# Patient Record
Sex: Female | Born: 1935 | Race: Black or African American | Hispanic: No | Smoking: Never smoker
Health system: Southern US, Community
[De-identification: ages and names within clinical notes are randomized; demographics above are authoritative.]

## PROBLEM LIST (undated history)

## (undated) DIAGNOSIS — M169 Osteoarthritis of hip, unspecified: Secondary | ICD-10-CM

## (undated) DIAGNOSIS — D509 Iron deficiency anemia, unspecified: Secondary | ICD-10-CM

## (undated) DIAGNOSIS — I10 Essential (primary) hypertension: Secondary | ICD-10-CM

## (undated) DIAGNOSIS — K259 Gastric ulcer, unspecified as acute or chronic, without hemorrhage or perforation: Secondary | ICD-10-CM

## (undated) DIAGNOSIS — E785 Hyperlipidemia, unspecified: Secondary | ICD-10-CM

## (undated) DIAGNOSIS — Z9889 Other specified postprocedural states: Secondary | ICD-10-CM

## (undated) DIAGNOSIS — E890 Postprocedural hypothyroidism: Secondary | ICD-10-CM

## (undated) DIAGNOSIS — Z90710 Acquired absence of both cervix and uterus: Secondary | ICD-10-CM

## (undated) DIAGNOSIS — I2510 Atherosclerotic heart disease of native coronary artery without angina pectoris: Secondary | ICD-10-CM

## (undated) HISTORY — DX: Postprocedural hypothyroidism: E89.0

## (undated) HISTORY — DX: Other specified postprocedural states: Z98.890

## (undated) HISTORY — PX: THYROIDECTOMY: SHX17

## (undated) HISTORY — DX: Osteoarthritis of hip, unspecified: M16.9

## (undated) HISTORY — DX: Hyperlipidemia, unspecified: E78.5

## (undated) HISTORY — DX: Gastric ulcer, unspecified as acute or chronic, without hemorrhage or perforation: K25.9

## (undated) HISTORY — DX: Acquired absence of both cervix and uterus: Z90.710

## (undated) HISTORY — PX: ABDOMINAL HYSTERECTOMY: SHX81

## (undated) HISTORY — DX: Iron deficiency anemia, unspecified: D50.9

## (undated) HISTORY — DX: Essential (primary) hypertension: I10

## (undated) HISTORY — PX: EYE SURGERY: SHX253

## (undated) HISTORY — DX: Atherosclerotic heart disease of native coronary artery without angina pectoris: I25.10

## (undated) NOTE — *Deleted (*Deleted)
Per  Patient request called her niece Shawna Orleans, states that she will be up to visit this evening.

---

## 2005-05-23 ENCOUNTER — Encounter: Admission: RE | Admit: 2005-05-23 | Discharge: 2005-05-23 | Payer: Self-pay | Admitting: Internal Medicine

## 2006-05-25 ENCOUNTER — Encounter: Admission: RE | Admit: 2006-05-25 | Discharge: 2006-05-25 | Payer: Self-pay | Admitting: Internal Medicine

## 2006-11-26 ENCOUNTER — Encounter: Payer: Self-pay | Admitting: Cardiology

## 2006-11-26 ENCOUNTER — Inpatient Hospital Stay (HOSPITAL_COMMUNITY): Admission: EM | Admit: 2006-11-26 | Discharge: 2006-12-03 | Payer: Self-pay | Admitting: Internal Medicine

## 2006-11-27 ENCOUNTER — Encounter (INDEPENDENT_AMBULATORY_CARE_PROVIDER_SITE_OTHER): Payer: Self-pay | Admitting: Cardiology

## 2006-11-27 ENCOUNTER — Encounter: Payer: Self-pay | Admitting: Cardiology

## 2006-11-29 ENCOUNTER — Encounter: Payer: Self-pay | Admitting: Cardiology

## 2006-11-29 IMAGING — CR DG HIP COMPLETE 2+V*R*
3 series · 3 of 3 positions shown · non-contrast
Comparison: none

CLINICAL DATA: Right groin/hip pain.  No history of trauma provided.
 RIGHT HIP INCLUDING AP PELVIS ? 2 VIEW:

[t pelvis a.p.]
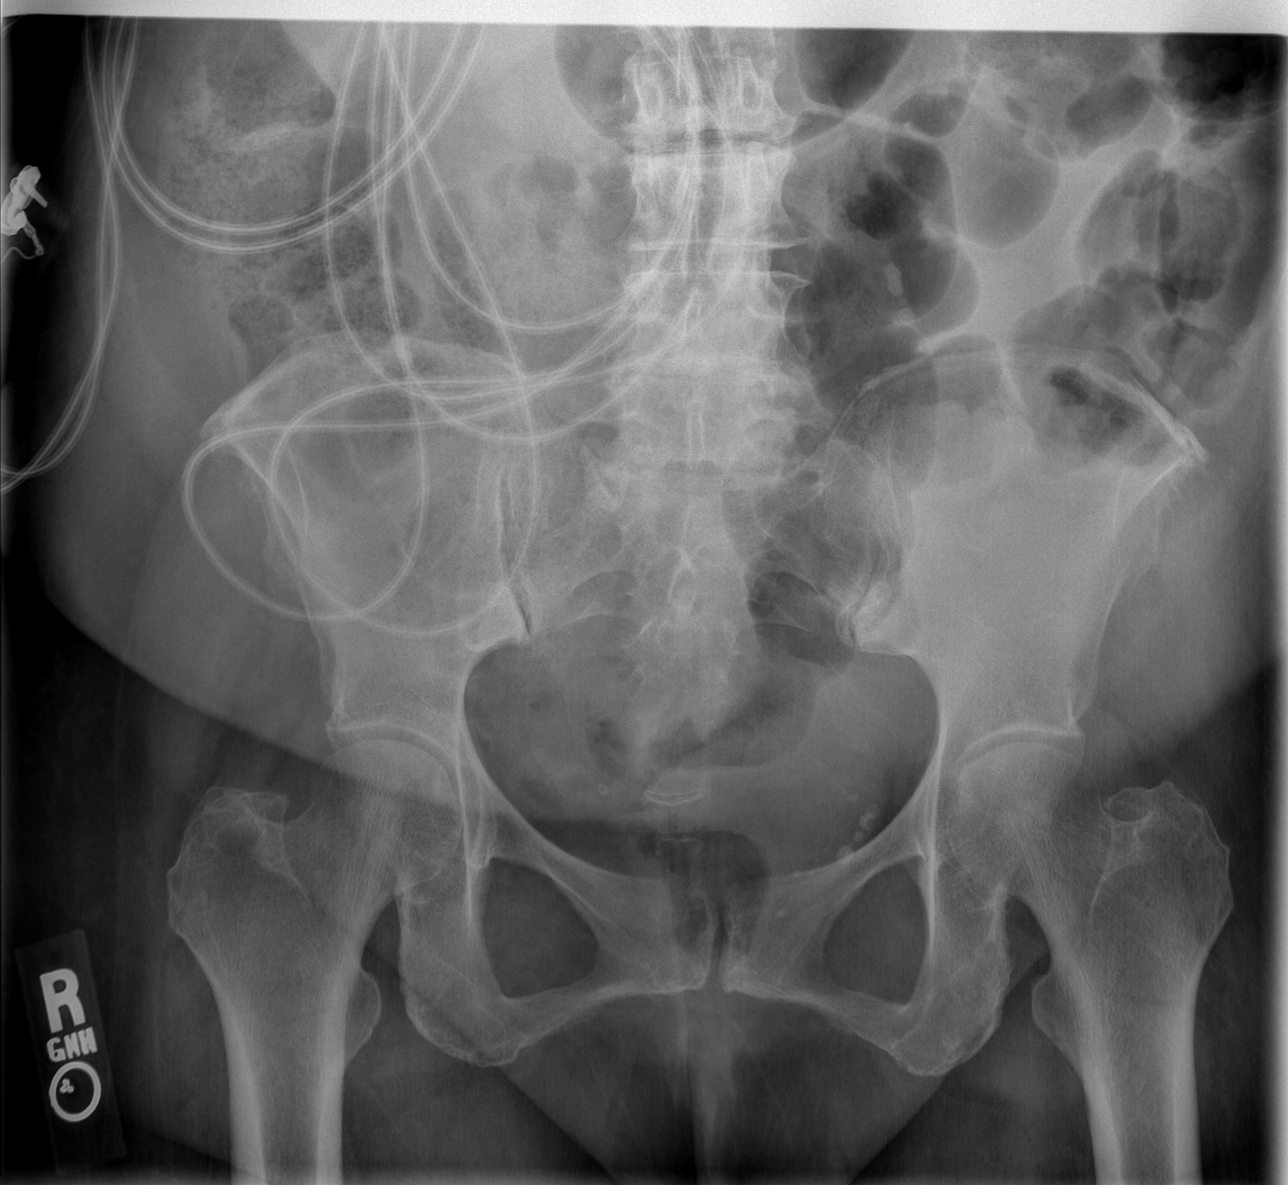

[t hip ap right]
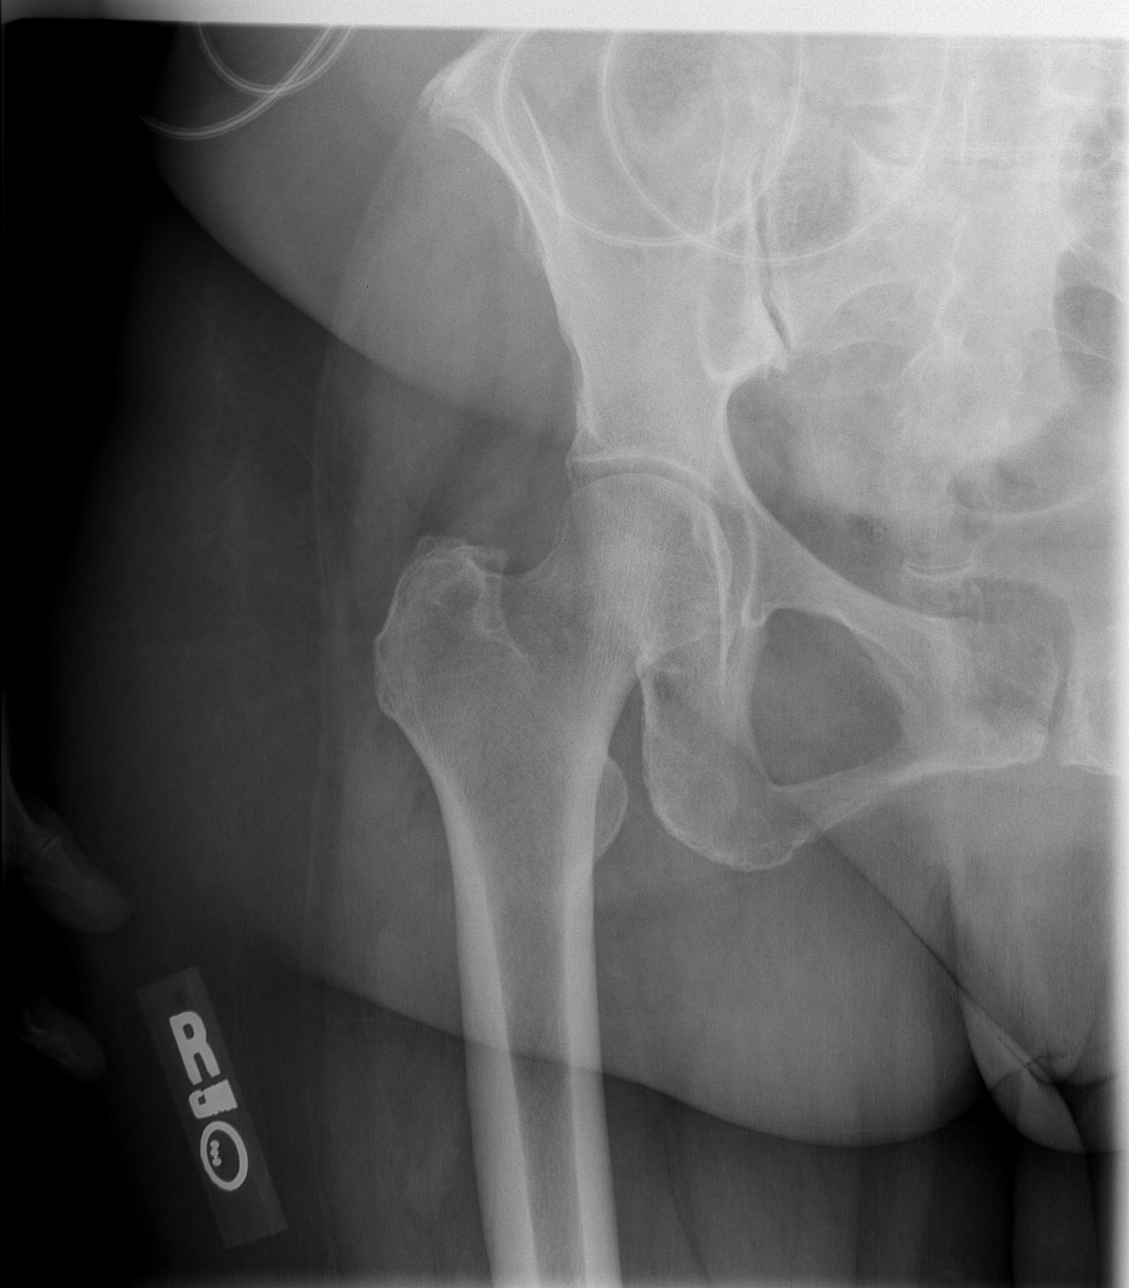

[t hip frog leg right]
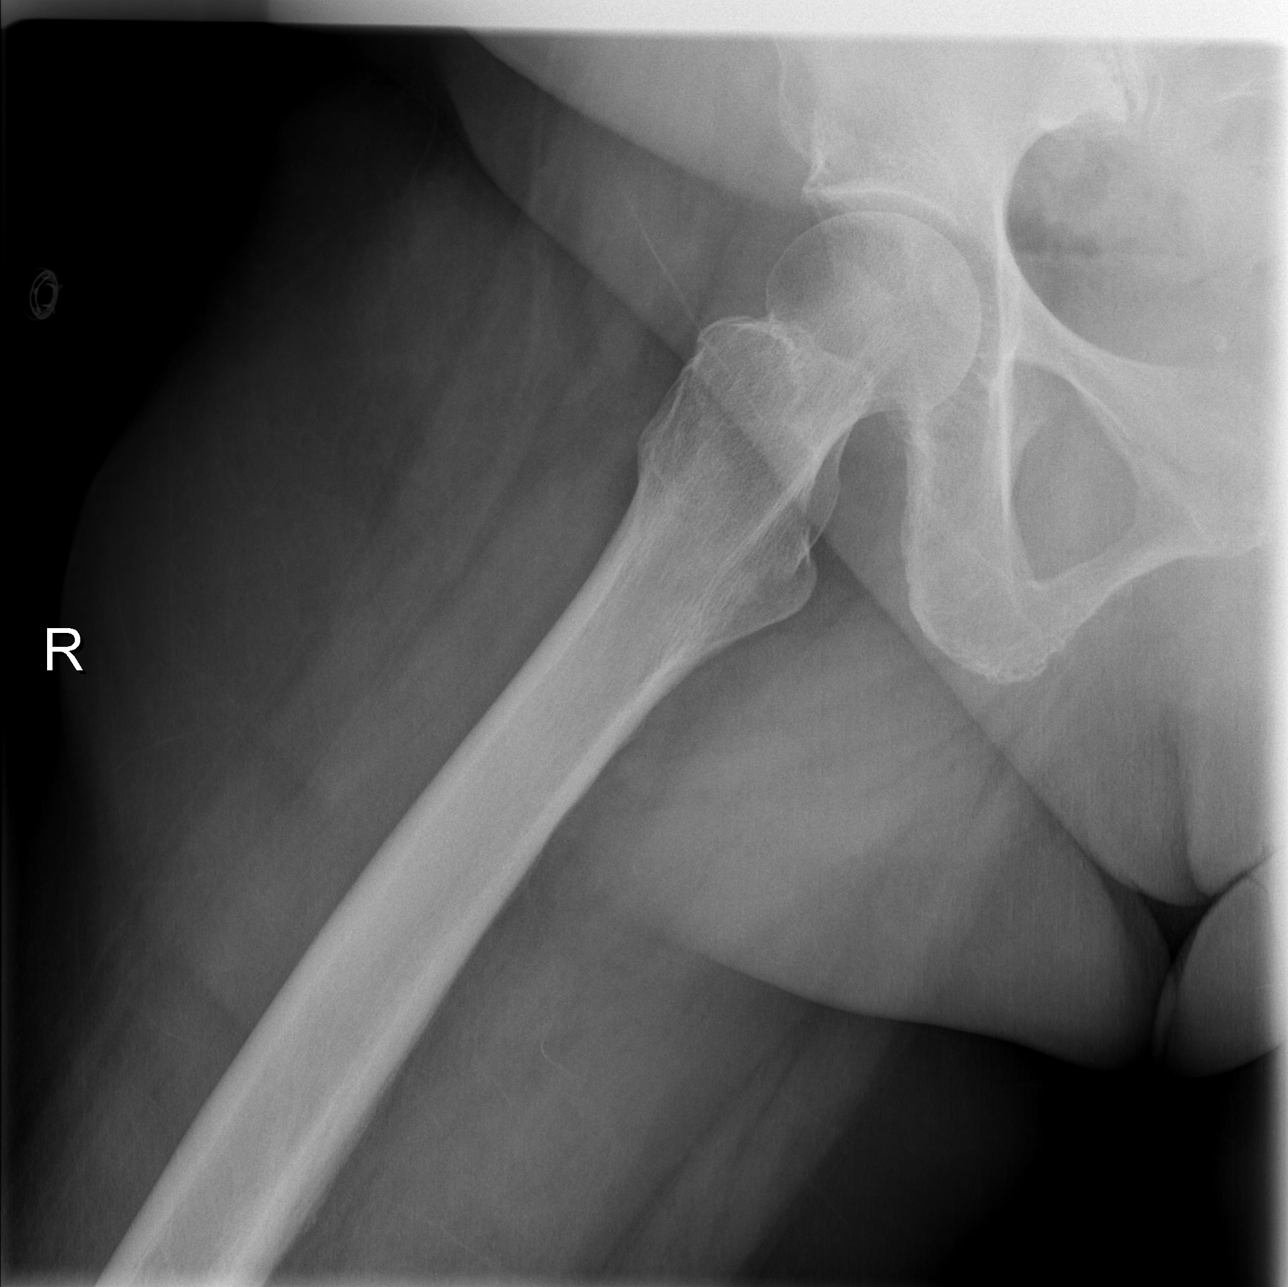

[3 of 3 positions shown; findings below may reference images not displayed]

FINDINGS: No significant right hip joint degenerative changes or plain film evidence of avascular necrosis.  If this were of concern, MR Nasifa be considered.  No bony destructive lesion.  Mild bilateral sacroiliac joint degenerative changes.  L4-5 and L5-S1 disk space narrowing.
IMPRESSION: 1.  Mild bilateral sacroiliac degenerative changes and degenerative change lower lumbar spine.  
 2.  No significant degenerative changes of either hip or plain film evidence of avascular necrosis.

## 2006-11-30 ENCOUNTER — Encounter: Payer: Self-pay | Admitting: Cardiology

## 2007-04-20 ENCOUNTER — Encounter: Payer: Self-pay | Admitting: Cardiology

## 2008-05-12 ENCOUNTER — Encounter: Admission: RE | Admit: 2008-05-12 | Discharge: 2008-05-12 | Payer: Self-pay | Admitting: Internal Medicine

## 2008-12-04 ENCOUNTER — Encounter: Payer: Self-pay | Admitting: Cardiology

## 2009-01-05 ENCOUNTER — Encounter: Payer: Self-pay | Admitting: Cardiology

## 2009-05-24 ENCOUNTER — Encounter: Admission: RE | Admit: 2009-05-24 | Discharge: 2009-05-24 | Payer: Self-pay | Admitting: Internal Medicine

## 2010-01-07 ENCOUNTER — Encounter: Payer: Self-pay | Admitting: Cardiology

## 2010-01-10 ENCOUNTER — Telehealth (INDEPENDENT_AMBULATORY_CARE_PROVIDER_SITE_OTHER): Payer: Self-pay | Admitting: *Deleted

## 2010-01-10 ENCOUNTER — Ambulatory Visit: Payer: Self-pay | Admitting: Cardiology

## 2010-01-10 DIAGNOSIS — I2510 Atherosclerotic heart disease of native coronary artery without angina pectoris: Secondary | ICD-10-CM | POA: Insufficient documentation

## 2010-01-10 DIAGNOSIS — I10 Essential (primary) hypertension: Secondary | ICD-10-CM

## 2010-01-31 ENCOUNTER — Ambulatory Visit: Payer: Self-pay | Admitting: Internal Medicine

## 2010-01-31 ENCOUNTER — Ambulatory Visit (HOSPITAL_COMMUNITY): Admission: RE | Admit: 2010-01-31 | Discharge: 2010-01-31 | Payer: Self-pay | Admitting: Cardiology

## 2010-01-31 ENCOUNTER — Ambulatory Visit: Payer: Self-pay

## 2010-01-31 ENCOUNTER — Encounter: Payer: Self-pay | Admitting: Cardiology

## 2010-07-18 ENCOUNTER — Ambulatory Visit: Payer: Self-pay | Admitting: Cardiology

## 2010-10-08 NOTE — Assessment & Plan Note (Signed)
Summary: f21m   Primary Bonnie Morton:  Bonnie Devon, MD  CC:  follow up 6 months..  History of Present Illness: 77 yo with history of CAD s/p NSTEMI in 2008 with 2 Cypher stents to the proximal to mid LAD presents for followup followup.  Bonnie Morton's symptoms in 2008 were chest burning with left arm radiation while walking.  She has been doing well since then with no further ACS episodes.  She gets rare mild chest burning after eating (not with exertion) that seems most likely to be reflux.  She does not get chest pain or shortness of breath with walking.  She is not particularly active but can walk all around [employer] and up and down steps without trouble.  At last appointment, she reported some episodes of lightheadedness with prolonged standing at church.  She has tried to avoid prolonged standing since then and has had no significant lightheadedness.  No syncope or presyncope.  She has been working on weight loss and weight is down 10 lbs.  Echo in 5/11 showed EF 60-65%, grade I diastolic dysfunction, normal wall motion.   ECG: Sinus bradycardia at 51, LAFB, incomplete RBBB.    Labs (5/11): TSH normal, LDL 53, HDL 57, K 3.6, creatinine 2.84  Current Medications (verified): 1)  Plavix 75 Mg Tabs (Clopidogrel Bisulfate) .... Take One Tablet Once Daily 2)  Aspirin 81 Mg Tabs (Aspirin) .... Once Daily 3)  Crestor 5 Mg Tabs (Rosuvastatin Calcium) .... Take One Tablet Once Daily 4)  Amlodipine Besylate 10 Mg Tabs (Amlodipine Besylate) .... Take One Tablet Once Daily 5)  Benicar Hct 40-25 Mg Tabs (Olmesartan Medoxomil-Hctz) .... Take One Tablet Once Daily 6)  Metoprolol Tartrate 25 Mg Tabs (Metoprolol Tartrate) .... Take 1/2 Tablet Two Times A Day 7)  Timolol Maleate 0.5 % Soln (Timolol Maleate) .... One Gtt Both Eyes  Two Times A Day 8)  Calcium-Vitamin D 600-200 Mg-Unit Tabs (Calcium-Vitamin D) .... Take One Tablet Two Times A Day 9)  Fish Oil 1000 Mg Caps (Omega-3 Fatty Acids) .... Take One  Capsule Daily 10)  Stool Softener 100 Mg Caps (Docusate Sodium) .... Daily  Allergies (verified): No Known Drug Allergies  Past History:  Past Medical History: 1. CAD:  Patient developed chest burning with radiation to her left arm with exertion in 2008.  She went to ER, found to have NSTEMI, LHC showed long 75-80% proximal to mid LAD stenosis with 99% ostial D2 stenosis and diffuse mild to moderate RCA disease.  She had 2 Cypher stents to the proximal to mid LAD.  Echo (5/11): EF 60-65%, grade I diastolic dysfunction, normal wall motion, no significant valvular abnormalities.  2.  Subtotal thyroidectomy 3.  HTN 4.  Hyperlipidemia 5.  Fe-deficiency anemia with normal colonoscopy last year per her report.  6.  Hip osteoarthritis 7.  Hysterectomy  Family History: Reviewed history from 01/10/2010 and no changes required. Noncontributory  Social History: Reviewed history from 01/10/2010 and no changes required. 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  She lives with her sister.  She does not smoke or drink ETOH.   Vital Signs:  Patient profile:   77 year old female Height:      63 inches Weight:      148 pounds Pulse rate:   51 / minute Pulse rhythm:   regular BP sitting:   120 / 68  (left arm) Cuff size:   regular  Vitals Entered By: Marchelle Folks  Trulove CMA (July 18, 2010 10:47 AM)  Physical Exam  General:  Well developed, well nourished, in no acute distress. Neck:  Neck supple, no JVD. No masses, thyromegaly or abnormal cervical nodes. Lungs:  Clear bilaterally to auscultation and percussion. Heart:  Non-displaced PMI, chest non-tender; regular rate and rhythm, S1, S2 without rubs or gallops. 1/6 early systolic murmur.  Carotid upstroke normal, no bruit. Weak posterior tibial pulses. No edema, no varicosities. Abdomen:  Bowel sounds positive; abdomen soft and non-tender without masses, organomegaly, or hernias noted. No  hepatosplenomegaly. Extremities:  No clubbing or cyanosis. Neurologic:  Alert and oriented x 3. Psych:  Normal affect.   Impression & Recommendations:  Problem # 1:  CORONARY ATHEROSCLEROSIS NATIVE CORONARY ARTERY (ICD-414.01) History of NSTEMI with LAD PCI in 2008.  No ischemic symptoms.  Doing well in general.  Will continue ASA, Plavix, statin, ARB, and beta blocker.  EF preserved on echo.  Lipids at goal in 5/11 (LDL < 70).   Problem # 2:  ESSENTIAL HYPERTENSION (ICD-401.9) BP at goal on current regimen.   Other Orders: EKG w/ Interpretation (93000)

## 2010-10-08 NOTE — Assessment & Plan Note (Signed)
Summary: np6/self refer/jss   Primary Provider:  Andi Devon, MD  CC:  new patient.  Needed to establish with cardiologist since Dr. Elsie Lincoln retired.  History of Present Illness: 77 yo with history of CAD s/p NSTEMI in 2008 with 2 Cypher stents to the proximal to mid LAD presents to establish cardiology followup.  Ms Bonnie Morton's symptoms in 2008 were chest burning with left arm radiation while walking.  She has been doing well since then with no further ACS episodes.  She gets rare mild chest burning after eating (not with exertion) that seems most likely to be reflux.  She does not get chest pain or shortness of breath with walking.  She is not particularly active but can walk all around [employer] and up and down steps without trouble.  She does note that for the last 3 Sundays in a row she has felt lightheaded and clammy after church.  She says that after taking a tablespoon of salt dissolved in water, she will feel better.  Her church service lasts about 2 hours and she is standing for a large portion of it. Besides these episodes, she does not get lightheaded and has no history of syncope.   ECG: Sinus bradycardia at 49, left axis deviation, incomplete RBBB.    Current Medications (verified): 1)  Plavix 75 Mg Tabs (Clopidogrel Bisulfate) .... Take One Tablet Once Daily 2)  Aspirin 81 Mg Tabs (Aspirin) .... Once Daily 3)  Crestor 5 Mg Tabs (Rosuvastatin Calcium) .... Take One Tablet Once Daily 4)  Amlodipine Besylate 10 Mg Tabs (Amlodipine Besylate) .... Take One Tablet Once Daily 5)  Benicar Hct 40-25 Mg Tabs (Olmesartan Medoxomil-Hctz) .... Take One Tablet Once Daily 6)  Metoprolol Tartrate 25 Mg Tabs (Metoprolol Tartrate) .... Take 1/2 Tablet Two Times A Day 7)  Timolol Maleate 0.5 % Soln (Timolol Maleate) .... One Gtt Both Eyes  Two Times A Day 8)  Lotemax 0.5 % Susp (Loteprednol Etabonate) .... One Drop in Right Eye Four Times Daily 9)  Calcium-Vitamin D 600-200 Mg-Unit Tabs  (Calcium-Vitamin D) .... Take One Tablet Two Times A Day 10)  Fish Oil 1000 Mg Caps (Omega-3 Fatty Acids) .... Take One Capsule Daily 11)  Stool Softener 100 Mg Caps (Docusate Sodium) .... Daily  Allergies (verified): No Known Drug Allergies  Past History:  Past Medical History: 1. CAD:  Patient developed chest burning with radiation to her left arm with exertion in 2008.  She went to ER, found to have NSTEMI, LHC showed long 75-80% proximal to mid LAD stenosis with 99% ostial D2 stenosis and diffuse mild to moderate RCA disease.  She had 2 Cypher stents to the proximal to mid LAD.  Echo (3/08) with EF 50%, basal posterior hypokinesis, mild LV hypertrophy.   2.  Subtotal thyroidectomy 3.  HTN 4.  Hyperlipidemia 5.  Fe-deficiency anemia with normal colonoscopy last year per her report.  6.  Hip osteoarthritis 7.  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  She lives with her sister.  She does not smoke or drink ETOH.   Review of Systems       All systems reviewed and negative except as per HPI.   Vital Signs:  Patient profile:   77 year old female Height:      63 inches Weight:      158 pounds BMI:     28 .09 Pulse rate:  49 / minute Pulse rhythm:   regular BP sitting:   124 / 60  (left arm) Cuff size:   regular  Vitals Entered By: Judithe Modest CMA (Jan 10, 2010 8:49 AM)  Physical Exam  General:  Well developed, well nourished, in no acute distress. Head:  normocephalic and atraumatic Nose:  no deformity, discharge, inflammation, or lesions Mouth:  Teeth, gums and palate normal. Oral mucosa normal. Neck:  Neck supple, no JVD. No masses, thyromegaly or abnormal cervical nodes. Lungs:  Clear bilaterally to auscultation and percussion. Heart:  Non-displaced PMI, chest non-tender; regular rate and rhythm, S1, S2 without rubs or gallops. 2/6 early systolic  murmur.  Carotid upstroke normal, no bruit. Weak posterior tibial pulses. No edema, no varicosities. Abdomen:  Bowel sounds positive; abdomen soft and non-tender without masses, organomegaly, or hernias noted. No hepatosplenomegaly. Msk:  Back normal, normal gait. Muscle strength and tone normal. Extremities:  No clubbing or cyanosis. Neurologic:  Alert and oriented x 3. Skin:  Intact without lesions or rashes. Psych:  Normal affect.   Impression & Recommendations:  Problem # 1:  CORONARY ATHEROSCLEROSIS NATIVE CORONARY ARTERY (ICD-414.01) History of NSTEMI with LAD PCI in 2008.  No ischemic symptoms.  Doing well in general.  Will continue ASA, Plavix, statin, ARB, and beta blocker.  She had mildy decreased LV systolic function on echo in 2008.  Will repeat echo to reassess LV systolic function.  We will call Dr. Mathews Robinsons office for her most recent cholesterol results.  Goal LDL < 70 with known CAD.   Problem # 2:  ESSENTIAL HYPERTENSION (ICD-401.9) BP at goal on current regimen.   Problem # 3:  LIGHTHEADEDNESS Patient had lightheadedness after 2 hour church services 3 Sundays in a row.  She spent a lot of time standing.  It is possible that she was somewhat dehydrated as she felt better after drinking water with salt.  I suggested that she stand as little as possible in church and keep herself hydrated.   Other Orders: Echocardiogram (Echo)  Patient Instructions: 1)  Your physician has requested that you have an echocardiogram.  Echocardiography is a painless test that uses sound waves to create images of your heart. It provides your doctor with information about the size and shape of your heart and how well your heart's chambers and valves are working.  This procedure takes approximately one hour. There are no restrictions for this procedure. 2)  Your physician wants you to follow-up in: 6 months with Dr Shirlee Latch.   You will receive a reminder letter in the mail two months in advance. If  you don't receive a letter, please call our office to schedule the follow-up appointment.

## 2010-10-08 NOTE — Letter (Signed)
[facility]   Imported By: Roderic Ovens 01/29/2010 14:05:04  _____________________________________________________________________  External Attachment:    Type:   Image     Comment:   External Document

## 2010-10-08 NOTE — Progress Notes (Signed)
  I faxed over ROI to [facility] requesting records,Records were faxed to me today, per Ernestine Mcmurray please scan in. [facility]  Jan 10, 2010 11:43 AM    Appended Document:  Records were given to [facility] to scan in.Marland KitchenMarland KitchenMarland Kitchen

## 2010-10-08 NOTE — Cardiovascular Report (Signed)
Summary: Cardiac Cath [facility]  Cardiac Cath [facility]   Imported By: Marylou Mccoy 01/10/2010 12:14:03  _____________________________________________________________________  External Attachment:    Type:   Image     Comment:   External Document

## 2010-10-08 NOTE — Consult Note (Signed)
Summary: [facility]  [facility]   Imported By: Marylou Mccoy 01/10/2010 11:58:09  _____________________________________________________________________  External Attachment:    Type:   Image     Comment:   External Document

## 2010-10-31 ENCOUNTER — Other Ambulatory Visit: Payer: Self-pay | Admitting: Internal Medicine

## 2010-10-31 DIAGNOSIS — Z1231 Encounter for screening mammogram for malignant neoplasm of breast: Secondary | ICD-10-CM

## 2010-11-20 ENCOUNTER — Ambulatory Visit
Admission: RE | Admit: 2010-11-20 | Discharge: 2010-11-20 | Disposition: A | Discharge status: Home / Self Care | Payer: Medicare Other | Source: Ambulatory Visit | Attending: Internal Medicine | Admitting: Internal Medicine

## 2010-11-20 DIAGNOSIS — Z1231 Encounter for screening mammogram for malignant neoplasm of breast: Secondary | ICD-10-CM

## 2010-11-20 IMAGING — MG MM DIGITAL SCREENING BILAT W/ CAD
4 series · 4 of 4 positions shown · non-contrast
Comparison: none

DG SCREEN MAMMOGRAM BILATERAL
Bilateral CC and MLO view(s) were taken.
Technologist: J Israel Bra, RT, RM

DIGITAL SCREENING MAMMOGRAM WITH CAD:
The breast tissue is almost entirely fatty.  No masses or malignant type calcifications are 
identified.  Compared with prior studies.
Images were processed with CAD.

[R CC]
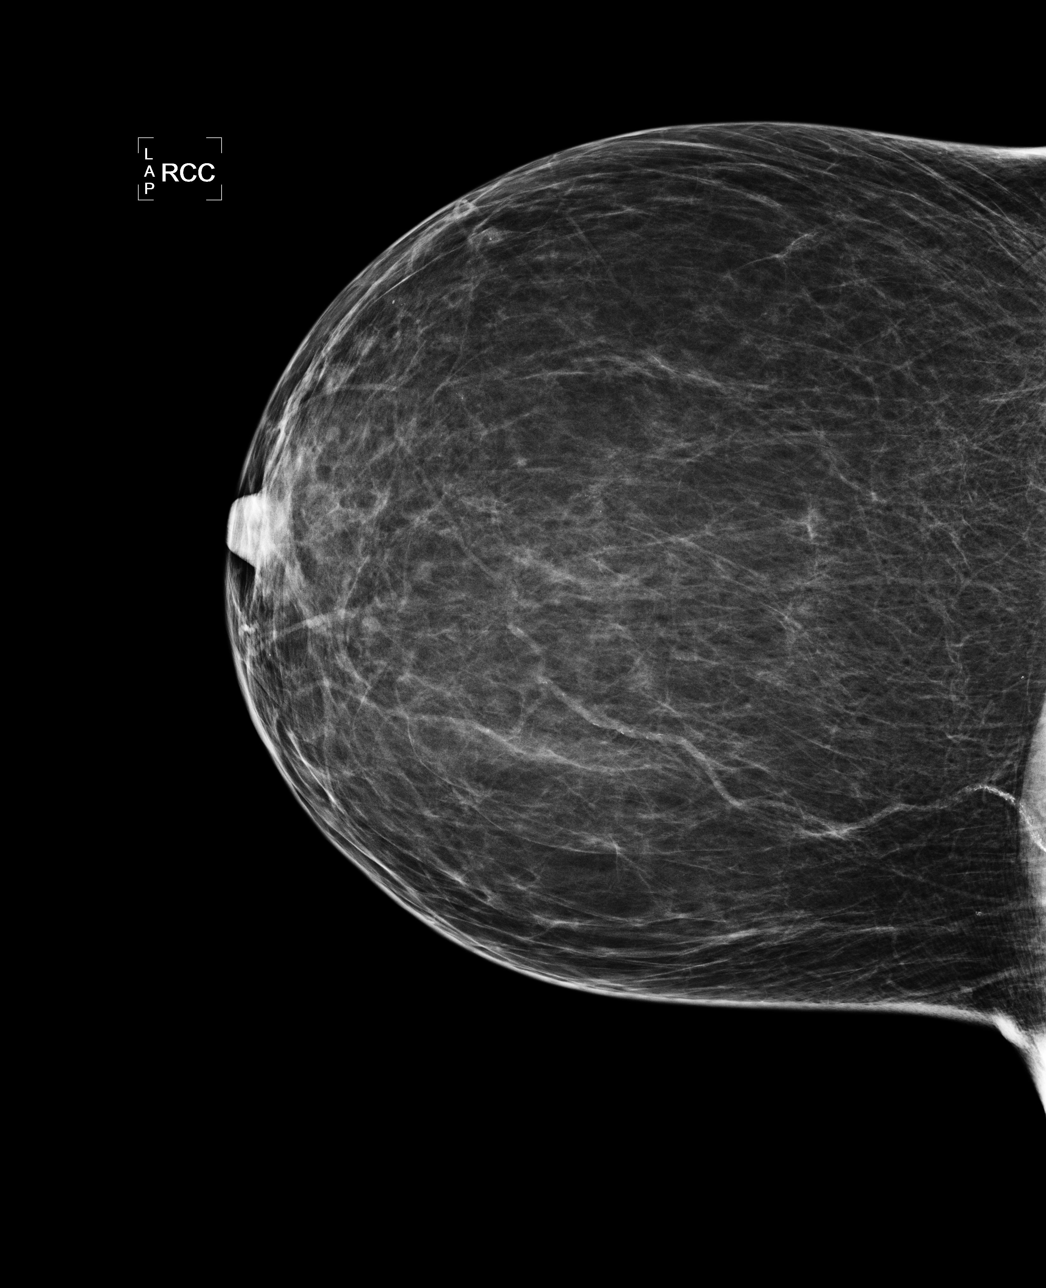

[L CC]
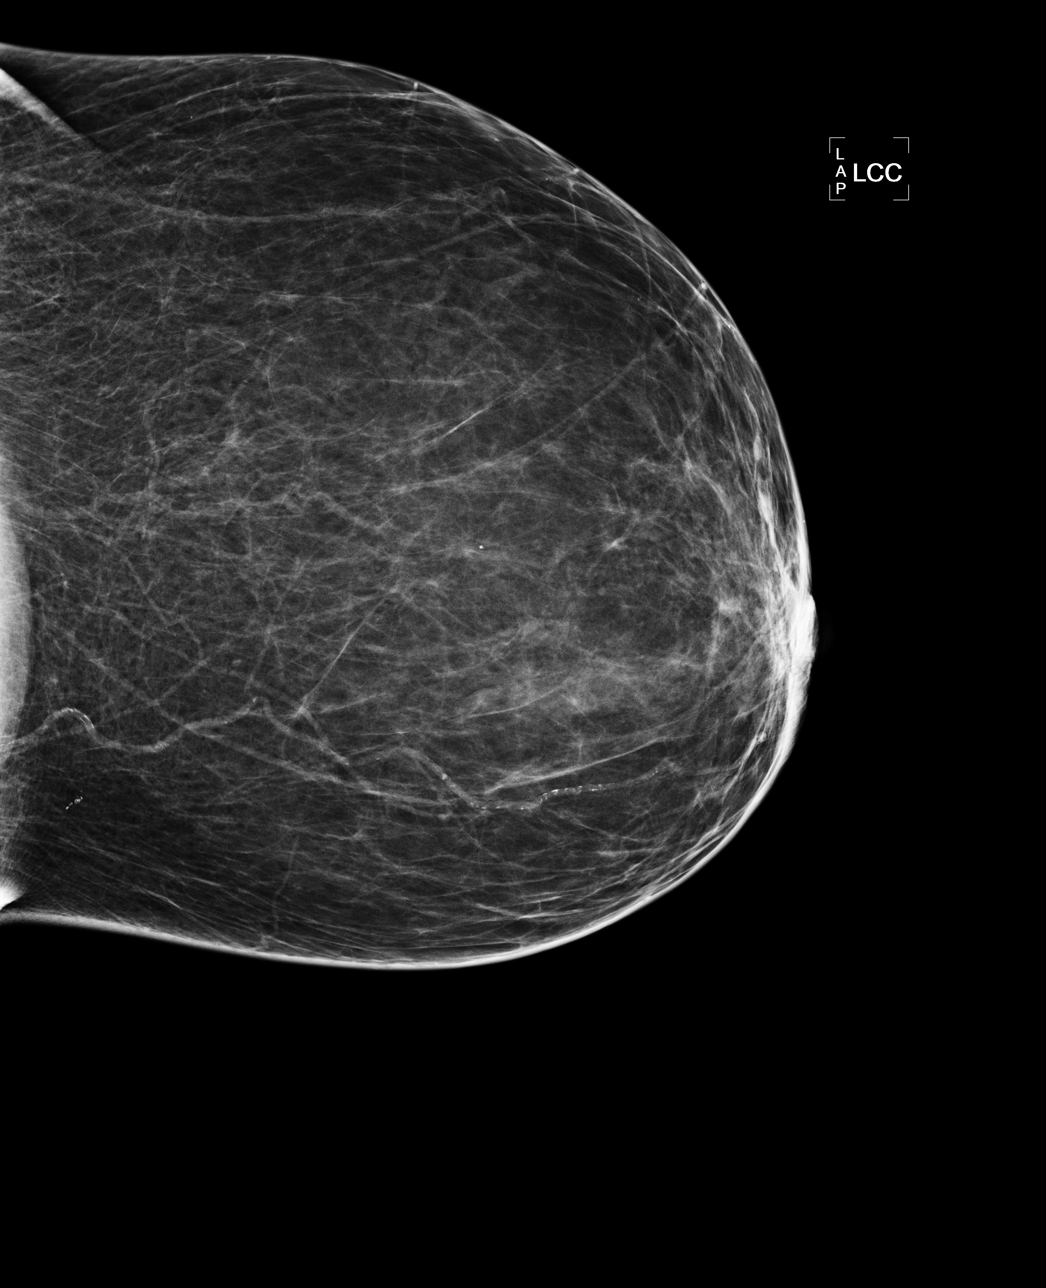

[L MLO]
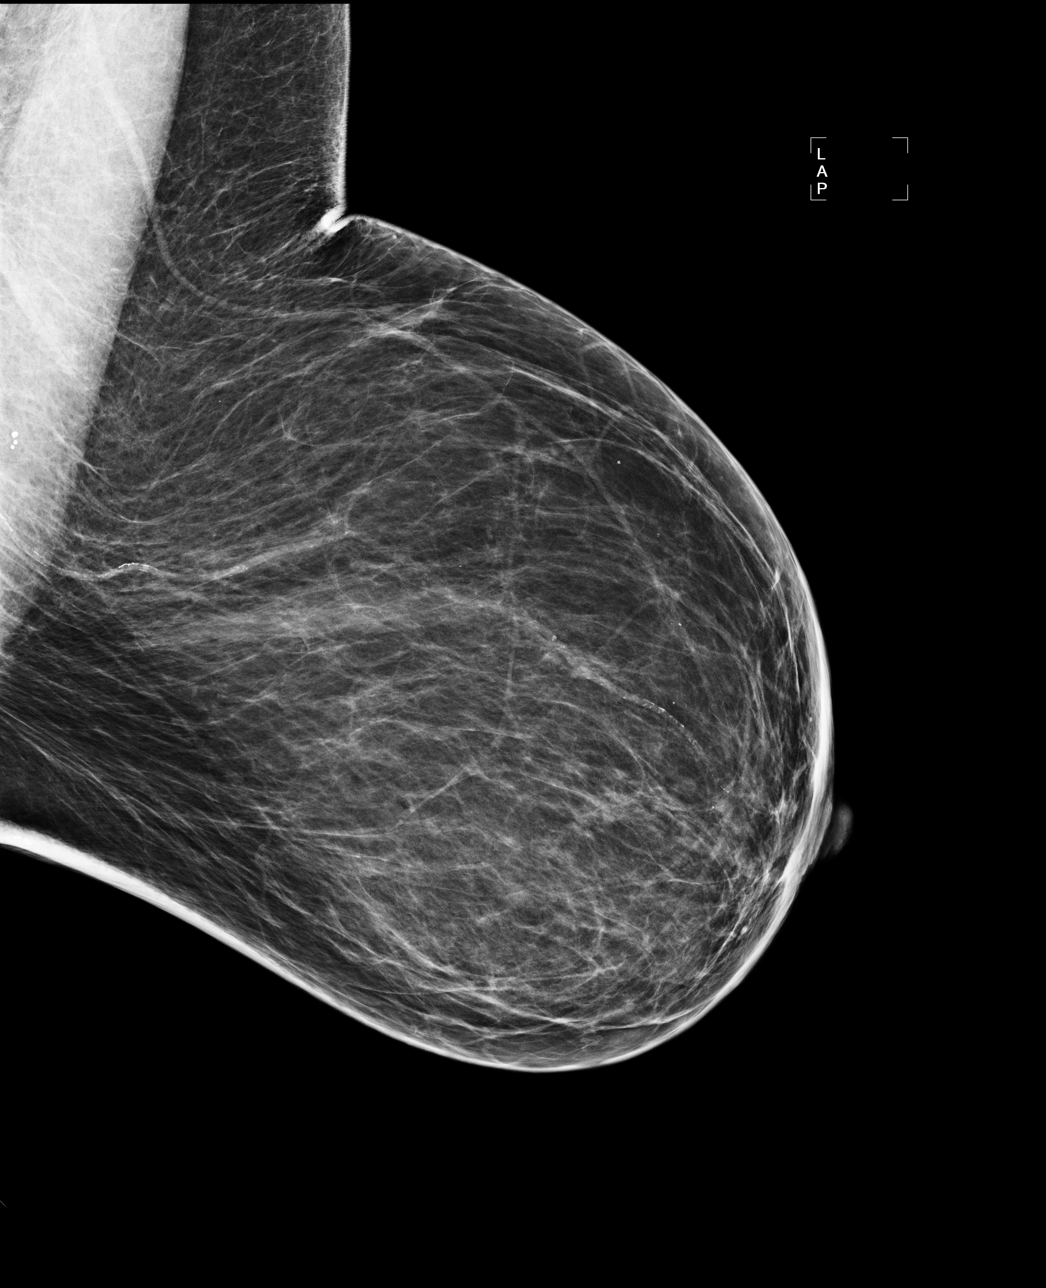

[R MLO]
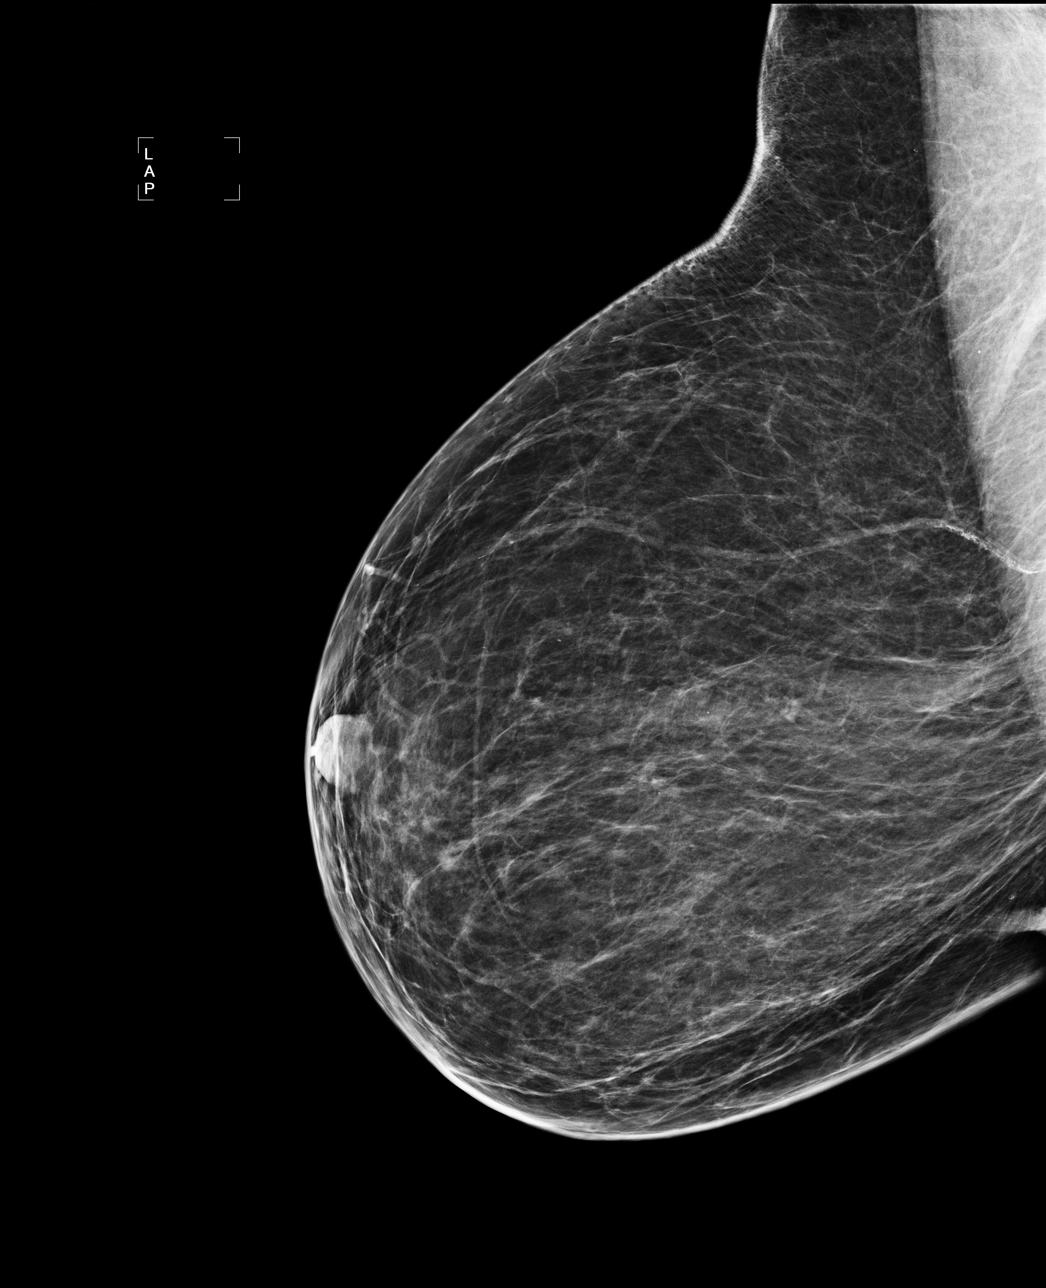

[4 of 4 positions shown; findings below may reference images not displayed]

IMPRESSION: No specific mammographic evidence of malignancy.  Next screening mammogram is recommended in one 
year.

A result letter of this screening mammogram will be mailed directly to the patient.

ASSESSMENT: Negative - BI-RADS 1

Screening mammogram in 1 year.
,

## 2011-01-23 ENCOUNTER — Encounter: Payer: Self-pay | Admitting: Cardiology

## 2011-01-24 NOTE — Discharge Summary (Signed)
NAME:  Bonnie Morton, Bonnie Morton NO.:  [id number]   MEDICAL RECORD NO.:  [id number]          PATIENT TYPE:  INP   LOCATION:  6532                         FACILITY:  [facility]   PHYSICIAN:  Bonnie Morton, M.D. DATE OF BIRTH:  06-25-1928   DATE OF ADMISSION:  11/26/2006  DATE OF DISCHARGE:  12/03/2006                               DISCHARGE SUMMARY   The patient's primary care doctor is Dr. Andi Morton.   FINAL DIAGNOSES:  1. Acute myocardial infarction.  2. Coronary artery disease.  3. Acute blood loss anemia.  4. Hypertension.  5. Hyperlipidemia.  6. Chronic hip discomfort.   PROCEDURES:  1. Cardiac catheterization performed by Dr. Chanda Morton on      [phone].  2. Transfusion of 3 units of packed red blood cells.  3. X-rays of the right hip.   HISTORY OF PRESENT ILLNESS:  Ms. Bonnie Morton is a 77 year old female who  arrived to her primary care doctor's office complaining of chest  discomfort.  She indicated that her symptoms were relieved by rest and  described it as a burning sensation in the mid chest area with radiation  to the left shoulder and arm.  Her symptoms occurred with activity.  She  was referred to the hospital for further evaluation.   HOSPITAL COURSE:  Problem 1. NON-ST-ELEVATED MYOCARDIAL INFARCTION.  The  patient's troponin I were noted to have been slightly elevated at 0.13,  0.11 and 0.11, respectively.  Her CK-MBs were found to be 3.7, 3.1 and  3.0.  Cardiology was consulted and on November 30, 2006, Dr. Elsie Morton  performed a cardiac catheterization.  His final diagnosis was that the  patient had a tight first diagonal branch stenosis, there was 75-85%  proximal and mid left anterior descending stenosis which was reduced to  nearly 10% residual, she had a normal left ventricular systolic function  at 70%.  The patient had two stents placed.  By the date of discharge,  the patient was no longer complaining of any chest pain.   Problem 2.  ACUTE BLOOD LOSS ANEMIA.  At the time of her admission, the  patient's hemoglobin was noted to be 8.6 with a hematocrit of 25.5.  Iron studies were ordered.  The iron level was noted to be 16 with a  TIBC of 601 and a percent sat of 3%.  It appeared that the patient had  iron deficiency.  Stool studies were also ordered and found to be  negative.  The patient received a total of 3 units of packed RBCs over  the course of her hospitalization.   Problem 3. HYPERTENSION.  The patient's medications were adjusted to try  to optimally control her blood pressure.   Problem 4. HYPERLIPIDEMIA.  Zocor was initiated.   Problem 5. CHRONIC HIP DISCOMFORT.  The patient indicated that for some  time she had been experiencing some right hip discomfort.  X-rays of the  patient's right hip were done on November 29, 2006.  They revealed mild  bilateral sacroiliac degenerative changes and degenerative change  involving the lower lumbar spine, no significant degenerative changes of  either hip or plain film evidence of avascular necrosis was seen.  By  the date of discharge, the patient indicated that her hips felt  better.  She also indicated that she will follow up with her primary care doctor  regarding this.  By the date of discharge, the patient stated that she felt pretty good  and she requested to be discharged from the hospital.  Her vitals: Her  temperature was 97.7, heart rate 56, respirations 17, blood pressure  137/58 and O2 sat 98% on room air.  The patient was discharged from the  hospital on:   1. Plavix 75 mg p.o. daily for 1 year.  2. Enteric-coated aspirin 81 mg daily.  3. Vasotec 10 mg daily.  4. Crestor 5 mg p.o. daily.  5. Metoprolol 25 mg p.o. b.i.d.   She was told not to take her Maxzide until she sees Dr. Elsie Morton or Dr.  Renae Morton but to continue all of her prior home medications.  Dr. Elsie Morton  requested that the patient follow up with him on April11,2008, at  2:00 p.m. and she was  told to call Dr. Renae Morton for follow-up evaluation.      Bonnie Morton, M.D.  Electronically Signed     OR/MEDQ  D:  01/14/2007  T:  01/14/2007  Job:  [id number]

## 2011-01-24 NOTE — Cardiovascular Report (Signed)
NAME:  Bonnie Morton, Bonnie Morton NO.:  [id number]   MEDICAL RECORD NO.:  [id number]          PATIENT TYPE:  INP   LOCATION:  3707                         FACILITY:  [facility]   PHYSICIAN:  Madaline Savage, M.D.DATE OF BIRTH:  1928/01/13   DATE OF PROCEDURE:  11/30/2006  DATE OF DISCHARGE:                            CARDIAC CATHETERIZATION   PROCEDURES PERFORMED:  1. Selective coronary angiography by Judkins technique.  2. Retrograde left heart catheterization.  3. Left ventricular angiography.  4. Percutaneous coronary artery stenting of the proximal and mid left      anterior descending coronary artery.  5. Intravascular ultrasound of the proximal and mid left anterior      descending coronary artery.   COMPLICATIONS:  None.   ENTRY SITE:  Left femoral.   DYE USED:  Omnipaque.   PATIENT PROFILE:  Bonnie Morton is a delightful 77 year old African-  American female who is a medical patient of Dr. Andi Devon who was  admitted for chest pain.  She has been followed here in the hospital by  the [facility] service.  The patient had a hemoglobin of 8.6 on admission  and was transfused. Because of her chest pain, she came to the cardiac  catheterization lab for diagnostic cardiac catheterization after she was  transfused up to a hemoglobin of 10.2.  The 2-D echocardiogram had shown  good LV systolic function. Troponin levels were elevated as were CK-MBs.  Cardiac catheterization showed the following results.  1. Left main coronary artery normal.  2. LAD:  LAD angiographically hazy in the proximal and mid portions      near septal perforator branch #1 and #2.  Not highly obstructive      angiographically but by intravascular ultrasound, the patient had a      75-85% lesion that was fairly long.  Ostial second diagonal branch      99%.  First diagonal branch large and not remarkable.  3. Circumflex codominant with RCA. No significant lesions.  4. RCA long, thin, thready  vessel with diffuse mild calcification,      nothing high grade  5. LV:  70% ejection fraction.  No wall motion abnormalities.   PROCEDURE:  The patient was given heparin, Integrilin, and ACT during  the case was 273. Percutaneous stenting was then performed with two  Cypher stents, the first of which was 3.0 x 13, the second of which was  3.0 x 18 overlapping. There was much difficulty in getting the two to  overlap because of the tortuosity and angulation of the vessel.  I  postdilated with Quantum Maverick balloons, and the majority of this  combined stent was 3.5 at one place proximally. I used 3.75 Quantum  Maverick balloons for dilatation, and I used 18 atmospheres of pressure.  The patient received IVUS 4-5 different times to ensure that the lumen  was at maximal stretched diameter.  There was one placed in the vessel  proximally where there was a large chunk of calcium that disallowed  maximum expansion of the stent, but overall the stent was at maximum  point of lumen diameter  of 3.3, and minimal lumen diameter was 3.0.  There was TIMI-3 flow preserved during the entire case.   FINAL DIAGNOSES:  1. Tight first diagonal branch stenosis not intervened upon.  2. 75-85% proximal and mid left anterior descending stenosis reduced      to nearly 10% residual and TIMI-3 preserved.  3. Normal left ventricular systolic function of 70%.   PLAN:  The patient was given Plavix in the catheterization lab and will  get with 300 more Plavix with her first meal. She will be followed for  hemoglobin and creatinine following this long procedure.           ______________________________  Madaline Savage, M.D.     WHG/MEDQ  D:  11/30/2006  T:  11/30/2006  Job:  [id number]   cc:   Merlene Laughter. Renae Gloss, M.D.  Cardiac Catheterization Lab

## 2011-01-30 ENCOUNTER — Encounter: Payer: Self-pay | Admitting: Cardiology

## 2011-02-13 ENCOUNTER — Encounter: Payer: Self-pay | Admitting: Cardiology

## 2011-02-21 ENCOUNTER — Ambulatory Visit: Payer: Medicare Other | Admitting: Cardiology

## 2011-03-28 ENCOUNTER — Encounter: Payer: Self-pay | Admitting: Cardiology

## 2011-03-28 ENCOUNTER — Ambulatory Visit (INDEPENDENT_AMBULATORY_CARE_PROVIDER_SITE_OTHER): Payer: Medicare Other | Admitting: Cardiology

## 2011-03-28 DIAGNOSIS — E785 Hyperlipidemia, unspecified: Secondary | ICD-10-CM

## 2011-03-28 DIAGNOSIS — I10 Essential (primary) hypertension: Secondary | ICD-10-CM

## 2011-03-28 DIAGNOSIS — I2510 Atherosclerotic heart disease of native coronary artery without angina pectoris: Secondary | ICD-10-CM

## 2011-03-28 NOTE — Patient Instructions (Signed)
Your physician wants you to follow-up in:  6 months. You will receive a reminder letter in the mail two months in advance. If you don't receive a letter, please call our office to schedule the follow-up appointment.   

## 2011-03-30 DIAGNOSIS — E785 Hyperlipidemia, unspecified: Secondary | ICD-10-CM | POA: Insufficient documentation

## 2011-03-30 NOTE — Assessment & Plan Note (Signed)
Stable with no ischemic symptoms.  Continue ASA, Plavix, metoprolol, ARB, statin.

## 2011-03-30 NOTE — Assessment & Plan Note (Signed)
Will call Dr. Mathews Robinsons office for most recent lipids.  Goal LDL < 70.

## 2011-03-30 NOTE — Assessment & Plan Note (Signed)
BP at goal on current meds

## 2011-03-30 NOTE — Progress Notes (Signed)
PCP: Dr. Renae Gloss  77 yo with history of CAD s/p NSTEMI in 2008 with 2 Cypher stents to the proximal to mid LAD presents for followup followup. Ms Bonnie Morton's symptoms in 2008 were chest burning with left arm radiation while walking. She has been doing well since then with no further ACS episodes. She gets rare mild chest burning after eating (not with exertion) that seems most likely to be reflux. She does not get chest pain or shortness of breath with walking. She is not particularly active but can walk all around [employer] and up and down steps without trouble.  She lives alone and does her shopping, [place], and other housework.  Echo in 5/11 showed EF 60-65%, grade I diastolic dysfunction, normal wall motion.  BP has been < 140/90 when she checks at home.   Labs (5/11): TSH normal, LDL 53, HDL 57, K 3.6, creatinine 4.09   Allergies (verified):  No Known Drug Allergies   Past Medical History:  1. CAD: Patient developed chest burning with radiation to her left arm with exertion in 2008. She went to ER, found to have NSTEMI, LHC showed long 75% proximal to mid LAD stenosis with 99% ostial D2 stenosis and diffuse mild to moderate RCA disease. She had 2 Cypher stents to the proximal to mid LAD. Echo (5/11): EF 60-65%, grade I diastolic dysfunction, normal wall motion, no significant valvular abnormalities.  2. Subtotal thyroidectomy  3. HTN  4. Hyperlipidemia  5. Fe-deficiency anemia with normal colonoscopy last year per her report.  6. Hip osteoarthritis  7. Hysterectomy  8. 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 She lives with her sister. She does not smoke or drink ETOH.   Current Outpatient Prescriptions  Medication Sig Dispense Refill  . amLODipine (NORVASC) 10 MG tablet Take 10 mg by mouth daily.        Marland Kitchen aspirin 81 MG tablet Take 81 mg by mouth daily.        . Calcium  600-200 MG-UNIT per tablet Take 1 tablet by mouth 2 (two) times daily.        . clopidogrel (PLAVIX) 75 MG tablet Take 75 mg by mouth daily.        Marland Kitchen docusate sodium (COLACE) 100 MG capsule Take 100 mg by mouth as needed.       . latanoprost (XALATAN) 0.005 % ophthalmic solution Place 1 drop into both eyes Daily.      . metoprolol tartrate (LOPRESSOR) 25 MG tablet Take 25 mg by mouth 2 (two) times daily.        Marland Kitchen olmesartan-hydrochlorothiazide (BENICAR HCT) 40-25 MG per tablet Take 1 tablet by mouth daily.        . Omega-3 Fatty Acids (FISH OIL) 1000 MG CAPS Take 1 capsule by mouth daily.        . rosuvastatin (CRESTOR) 5 MG tablet Take 5 mg by mouth daily.        . timolol (TIMOPTIC) 0.5 % ophthalmic solution Place 1 drop into both eyes 2 (two) times daily.          BP 134/68  Pulse 60  Ht 5\' 3"  (1.6 m)  Wt 146 lb (66.225 kg)  BMI 25.86 kg/m2 General: Well developed, well nourished, in no acute distress.  Neck: Neck supple, no JVD. No masses, thyromegaly or abnormal cervical nodes.  Lungs: Clear bilaterally to auscultation and percussion.  Heart: Non-displaced PMI, chest non-tender; regular rate and rhythm, S1, S2 without rubs or gallops. 1/6 early systolic murmur. Carotid upstroke normal, no bruit. Weak posterior tibial pulses. No edema, no varicosities.  Abdomen: Bowel sounds positive; abdomen soft and non-tender without masses, organomegaly, or hernias noted. No hepatosplenomegaly.  Extremities: No clubbing or cyanosis.  Neurologic: Alert and oriented x 3.  Psych: Normal affect.

## 2011-08-01 ENCOUNTER — Inpatient Hospital Stay (HOSPITAL_COMMUNITY)
Admission: EM | Admit: 2011-08-01 | Discharge: 2011-08-05 | DRG: 378 | Disposition: A | Discharge status: Home / Self Care | Payer: Medicare Other | Attending: Internal Medicine | Admitting: Internal Medicine

## 2011-08-01 ENCOUNTER — Emergency Department (HOSPITAL_COMMUNITY): Payer: Medicare Other

## 2011-08-01 ENCOUNTER — Other Ambulatory Visit: Payer: Self-pay

## 2011-08-01 ENCOUNTER — Encounter (HOSPITAL_COMMUNITY): Payer: Self-pay | Admitting: Emergency Medicine

## 2011-08-01 DIAGNOSIS — Z7982 Long term (current) use of aspirin: Secondary | ICD-10-CM

## 2011-08-01 DIAGNOSIS — E876 Hypokalemia: Secondary | ICD-10-CM | POA: Diagnosis not present

## 2011-08-01 DIAGNOSIS — M1610 Unilateral primary osteoarthritis, unspecified hip: Secondary | ICD-10-CM | POA: Diagnosis present

## 2011-08-01 DIAGNOSIS — R609 Edema, unspecified: Secondary | ICD-10-CM | POA: Diagnosis present

## 2011-08-01 DIAGNOSIS — H409 Unspecified glaucoma: Secondary | ICD-10-CM | POA: Diagnosis present

## 2011-08-01 DIAGNOSIS — I2510 Atherosclerotic heart disease of native coronary artery without angina pectoris: Secondary | ICD-10-CM | POA: Diagnosis present

## 2011-08-01 DIAGNOSIS — E785 Hyperlipidemia, unspecified: Secondary | ICD-10-CM | POA: Diagnosis present

## 2011-08-01 DIAGNOSIS — R600 Localized edema: Secondary | ICD-10-CM | POA: Diagnosis present

## 2011-08-01 DIAGNOSIS — K257 Chronic gastric ulcer without hemorrhage or perforation: Secondary | ICD-10-CM | POA: Diagnosis present

## 2011-08-01 DIAGNOSIS — K279 Peptic ulcer, site unspecified, unspecified as acute or chronic, without hemorrhage or perforation: Secondary | ICD-10-CM | POA: Diagnosis present

## 2011-08-01 DIAGNOSIS — E871 Hypo-osmolality and hyponatremia: Secondary | ICD-10-CM | POA: Diagnosis present

## 2011-08-01 DIAGNOSIS — D649 Anemia, unspecified: Secondary | ICD-10-CM | POA: Diagnosis present

## 2011-08-01 DIAGNOSIS — K250 Acute gastric ulcer with hemorrhage: Principal | ICD-10-CM | POA: Diagnosis present

## 2011-08-01 DIAGNOSIS — M169 Osteoarthritis of hip, unspecified: Secondary | ICD-10-CM | POA: Diagnosis present

## 2011-08-01 DIAGNOSIS — K922 Gastrointestinal hemorrhage, unspecified: Secondary | ICD-10-CM

## 2011-08-01 DIAGNOSIS — K5900 Constipation, unspecified: Secondary | ICD-10-CM | POA: Diagnosis present

## 2011-08-01 DIAGNOSIS — I10 Essential (primary) hypertension: Secondary | ICD-10-CM | POA: Diagnosis present

## 2011-08-01 DIAGNOSIS — I252 Old myocardial infarction: Secondary | ICD-10-CM

## 2011-08-01 DIAGNOSIS — T465X5A Adverse effect of other antihypertensive drugs, initial encounter: Secondary | ICD-10-CM | POA: Diagnosis present

## 2011-08-01 DIAGNOSIS — Z79899 Other long term (current) drug therapy: Secondary | ICD-10-CM

## 2011-08-01 DIAGNOSIS — D500 Iron deficiency anemia secondary to blood loss (chronic): Secondary | ICD-10-CM | POA: Diagnosis present

## 2011-08-01 DIAGNOSIS — Z9861 Coronary angioplasty status: Secondary | ICD-10-CM

## 2011-08-01 DIAGNOSIS — G4700 Insomnia, unspecified: Secondary | ICD-10-CM

## 2011-08-01 LAB — FOLATE: Folate: 17.2 ng/mL

## 2011-08-01 LAB — RETICULOCYTES
RBC.: 3.24 MIL/uL — ABNORMAL LOW (ref 3.87–5.11)
Retic Count, Absolute: 32.4 10*3/uL (ref 19.0–186.0)
Retic Ct Pct: 1 % (ref 0.4–3.1)

## 2011-08-01 LAB — DIFFERENTIAL
Basophils Relative: 1 % (ref 0–1)
Eosinophils Absolute: 0.2 10*3/uL (ref 0.0–0.7)
Lymphs Abs: 1.2 10*3/uL (ref 0.7–4.0)
Monocytes Absolute: 1.2 10*3/uL — ABNORMAL HIGH (ref 0.1–1.0)
Monocytes Relative: 13 % — ABNORMAL HIGH (ref 3–12)
Neutro Abs: 6.7 10*3/uL (ref 1.7–7.7)

## 2011-08-01 LAB — CBC
MCH: 18.5 pg — ABNORMAL LOW (ref 26.0–34.0)
MCHC: 28.1 g/dL — ABNORMAL LOW (ref 30.0–36.0)
MCV: 66 fL — ABNORMAL LOW (ref 78.0–100.0)
Platelets: 511 10*3/uL — ABNORMAL HIGH (ref 150–400)

## 2011-08-01 LAB — APTT: aPTT: 38 seconds — ABNORMAL HIGH (ref 24–37)

## 2011-08-01 LAB — ABO/RH: ABO/RH(D): B POS

## 2011-08-01 LAB — BASIC METABOLIC PANEL
CO2: 25 mEq/L (ref 19–32)
Calcium: 9.3 mg/dL (ref 8.4–10.5)
Creatinine, Ser: 1.15 mg/dL — ABNORMAL HIGH (ref 0.50–1.10)
Glucose, Bld: 104 mg/dL — ABNORMAL HIGH (ref 70–99)
Potassium: 3.5 mEq/L (ref 3.5–5.1)

## 2011-08-01 LAB — CARDIAC PANEL(CRET KIN+CKTOT+MB+TROPI)
Relative Index: 2.6 — ABNORMAL HIGH (ref 0.0–2.5)
Relative Index: INVALID (ref 0.0–2.5)
Total CK: 98 U/L (ref 7–177)
Troponin I: <0.3 ng/mL (ref <0.30)

## 2011-08-01 LAB — VITAMIN B12: Vitamin B-12: 685 pg/mL (ref 211–911)

## 2011-08-01 IMAGING — CR DG ABDOMEN ACUTE W/ 1V CHEST
4 series · 4 of 4 positions shown · non-contrast
Comparison: None

CLINICAL DATA: Constipation and chest pain

ACUTE ABDOMEN SERIES (ABDOMEN 2 VIEW & CHEST 1 VIEW)

[w chest pa]
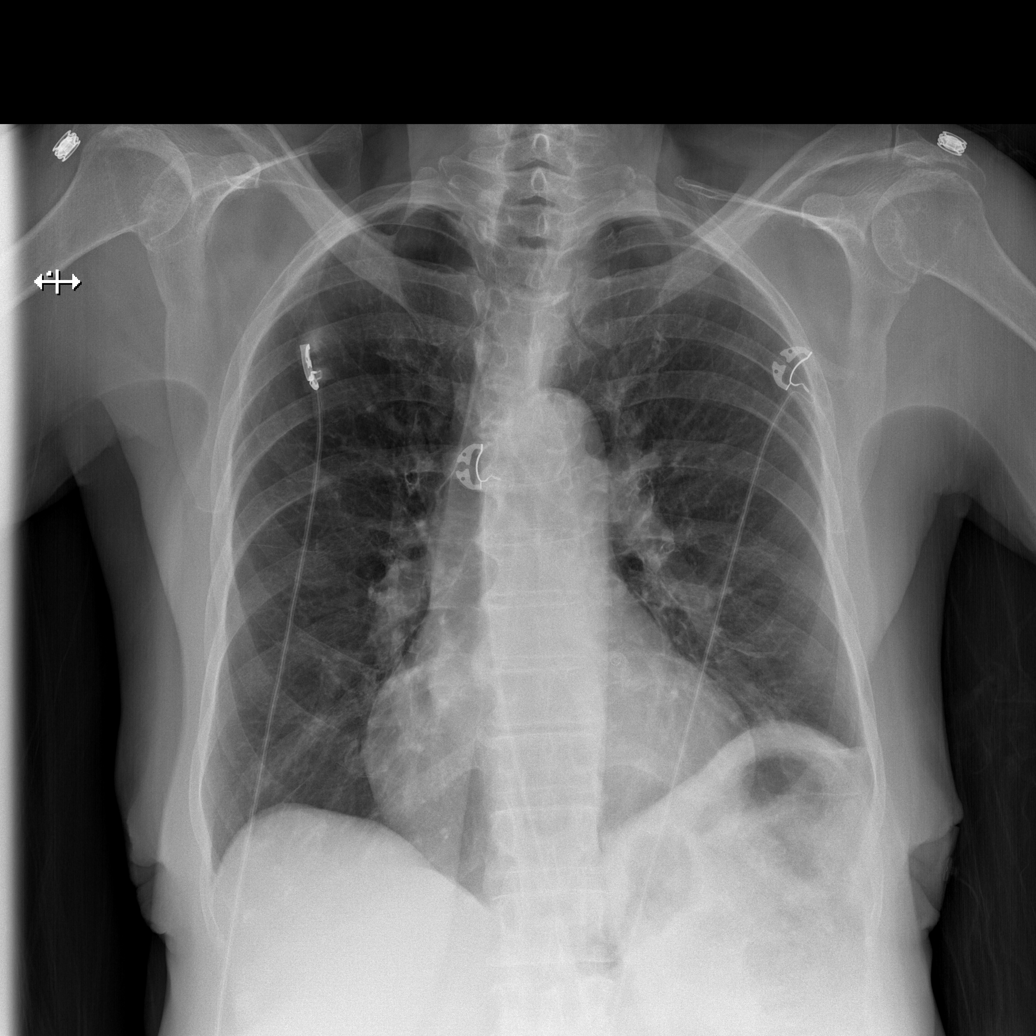

[w abdomen upright]
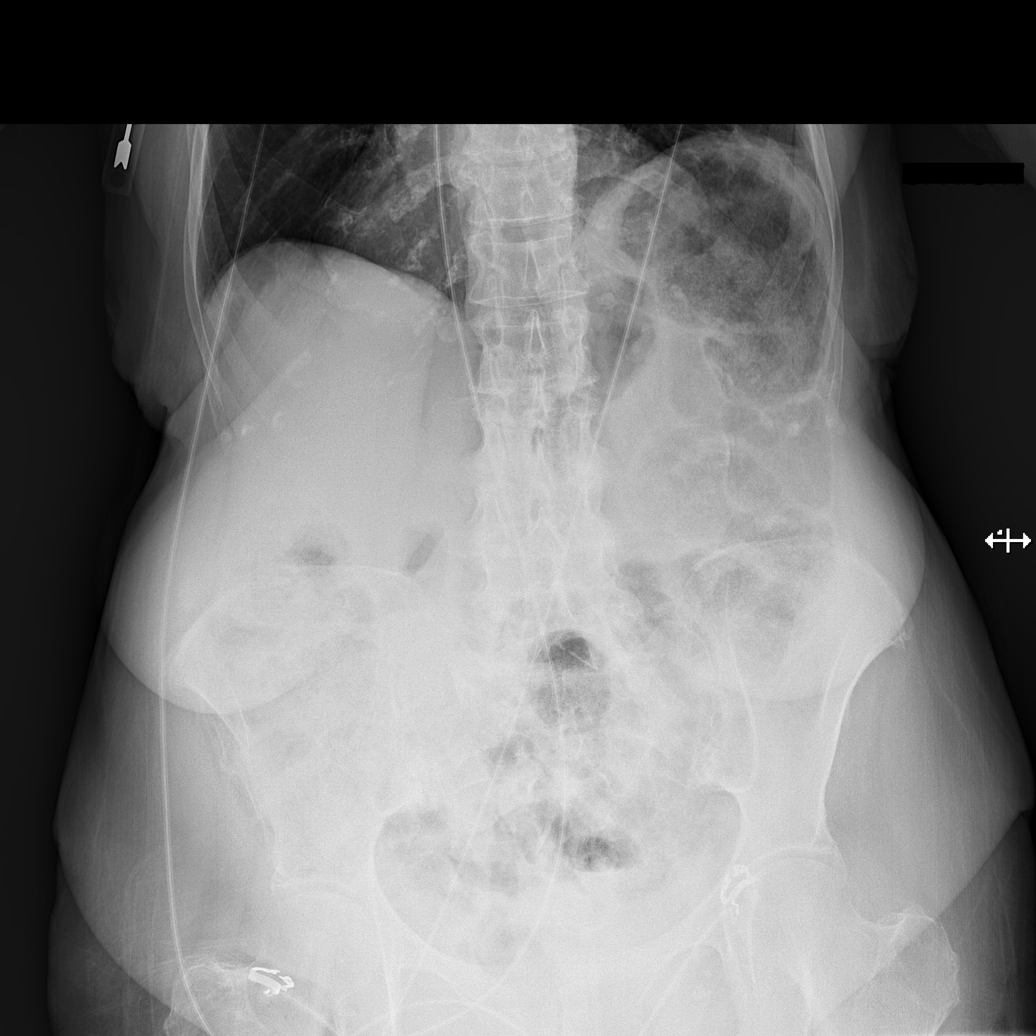

[t abdomen supine (1 of 2)]
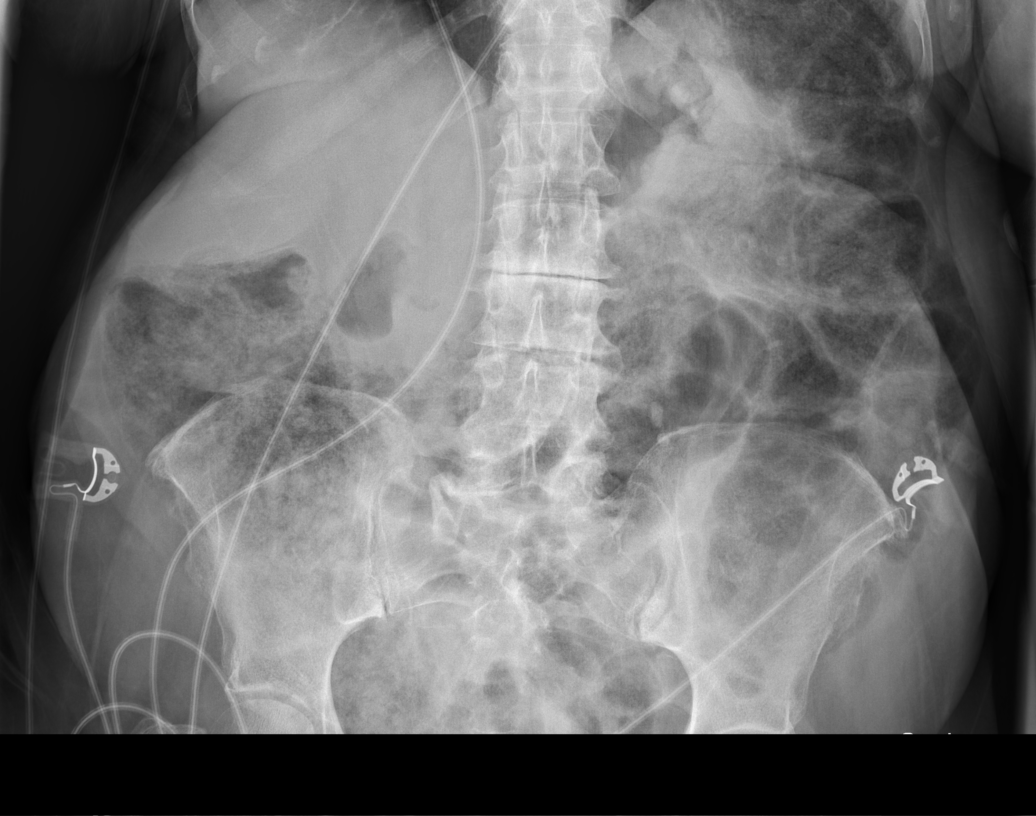

[t abdomen supine (2 of 2)]
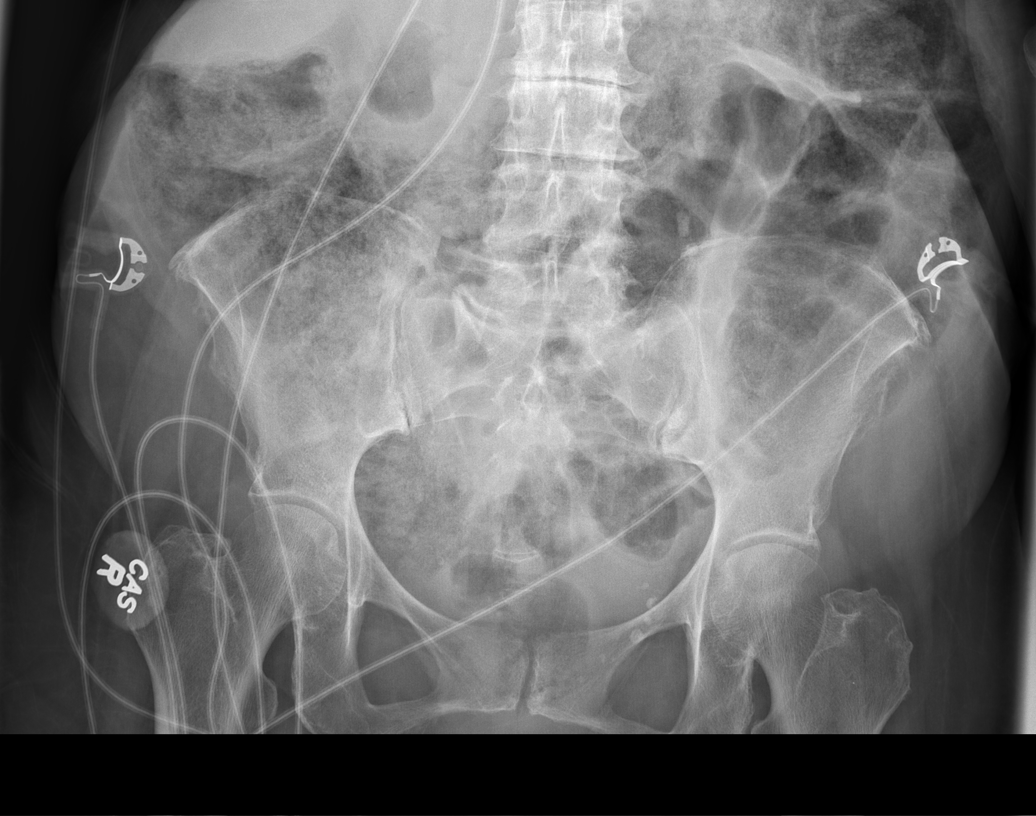

[4 of 4 positions shown; findings below may reference images not displayed]

FINDINGS: The heart size appears normal.

There is no pleural effusion or pulmonary edema identified.

There is no airspace consolidation identified.

The bowel gas pattern appears non obstructed.

Moderate stool burden is identified throughout the colon and up to
the rectum consistent with the clinical history of constipation.
IMPRESSION: 1.  No active cardiopulmonary abnormality.
2.  Moderate stool burden throughout the colon consistent with
constipation.

## 2011-08-01 MED ORDER — SENNOSIDES-DOCUSATE SODIUM 8.6-50 MG PO TABS
1.0000 | ORAL_TABLET | Freq: Every day | ORAL | Status: DC | PRN
  Filled 2011-08-01: qty 1

## 2011-08-01 MED ORDER — INFLUENZA VIRUS VACC SPLIT PF IM SUSP
0.5000 mL | INTRAMUSCULAR | Status: AC
  Administered 2011-08-02: 0.5 mL via INTRAMUSCULAR
  Filled 2011-08-01: qty 0.5

## 2011-08-01 MED ORDER — ALUM & MAG HYDROXIDE-SIMETH 200-200-20 MG/5ML PO SUSP
30.0000 mL | Freq: Four times a day (QID) | ORAL | Status: DC | PRN
  Administered 2011-08-01: 30 mL via ORAL
  Filled 2011-08-01: qty 30

## 2011-08-01 MED ORDER — ONDANSETRON HCL 4 MG PO TABS: 4.0000 mg | ORAL_TABLET | Freq: Four times a day (QID) | ORAL | Status: DC | PRN

## 2011-08-01 MED ORDER — FUROSEMIDE 10 MG/ML IJ SOLN
20.0000 mg | Freq: Once | INTRAMUSCULAR | Status: AC
  Administered 2011-08-02: 20 mg via INTRAVENOUS
  Filled 2011-08-01: qty 2

## 2011-08-01 MED ORDER — POLYETHYLENE GLYCOL 3350 17 G PO PACK
17.0000 g | PACK | Freq: Every day | ORAL | Status: DC
  Administered 2011-08-04 – 2011-08-05 (×2): 17 g via ORAL
  Filled 2011-08-01 (×6): qty 1

## 2011-08-01 MED ORDER — METOPROLOL TARTRATE 12.5 MG HALF TABLET
12.5000 mg | ORAL_TABLET | Freq: Two times a day (BID) | ORAL | Status: DC
  Administered 2011-08-01 – 2011-08-05 (×8): 12.5 mg via ORAL
  Filled 2011-08-01 (×11): qty 1

## 2011-08-01 MED ORDER — DIPHENHYDRAMINE HCL 50 MG/ML IJ SOLN
12.5000 mg | Freq: Once | INTRAMUSCULAR | Status: AC
  Administered 2011-08-01: 12.5 mg via INTRAVENOUS
  Filled 2011-08-01: qty 1

## 2011-08-01 MED ORDER — ACETAMINOPHEN 500 MG PO TABS
1000.0000 mg | ORAL_TABLET | Freq: Once | ORAL | Status: AC
  Administered 2011-08-01: 1000 mg via ORAL
  Filled 2011-08-01: qty 2

## 2011-08-01 MED ORDER — FUROSEMIDE 10 MG/ML IJ SOLN
20.0000 mg | Freq: Once | INTRAMUSCULAR | Status: AC
  Administered 2011-08-01: 20 mg via INTRAVENOUS
  Filled 2011-08-01: qty 2

## 2011-08-01 MED ORDER — ACETAMINOPHEN 650 MG RE SUPP: 650.0000 mg | Freq: Four times a day (QID) | RECTAL | Status: DC | PRN

## 2011-08-01 MED ORDER — ASPIRIN 81 MG PO TABS
81.0000 mg | ORAL_TABLET | Freq: Every day | ORAL | Status: DC
  Filled 2011-08-01 (×4): qty 1

## 2011-08-01 MED ORDER — PANTOPRAZOLE SODIUM 40 MG PO TBEC
40.0000 mg | DELAYED_RELEASE_TABLET | Freq: Every day | ORAL | Status: DC
  Administered 2011-08-02 – 2011-08-05 (×4): 40 mg via ORAL
  Filled 2011-08-01 (×7): qty 1

## 2011-08-01 MED ORDER — ROSUVASTATIN CALCIUM 5 MG PO TABS
5.0000 mg | ORAL_TABLET | Freq: Every day | ORAL | Status: DC
  Administered 2011-08-01 – 2011-08-05 (×5): 5 mg via ORAL
  Filled 2011-08-01 (×6): qty 1

## 2011-08-01 MED ORDER — ACETAMINOPHEN 325 MG PO TABS: 650.0000 mg | ORAL_TABLET | Freq: Four times a day (QID) | ORAL | Status: DC | PRN

## 2011-08-01 MED ORDER — OMEGA-3-ACID ETHYL ESTERS 1 G PO CAPS
1.0000 g | ORAL_CAPSULE | Freq: Every day | ORAL | Status: DC
  Administered 2011-08-01 – 2011-08-05 (×5): 1 g via ORAL
  Filled 2011-08-01 (×6): qty 1

## 2011-08-01 MED ORDER — ZOLPIDEM TARTRATE 5 MG PO TABS
5.0000 mg | ORAL_TABLET | Freq: Every evening | ORAL | Status: DC | PRN
  Administered 2011-08-01 – 2011-08-03 (×3): 5 mg via ORAL
  Filled 2011-08-01 (×3): qty 1

## 2011-08-01 MED ORDER — LATANOPROST 0.005 % OP SOLN
1.0000 [drp] | Freq: Every day | OPHTHALMIC | Status: DC
  Administered 2011-08-01 – 2011-08-05 (×5): 1 [drp] via OPHTHALMIC
  Filled 2011-08-01: qty 2.5

## 2011-08-01 MED ORDER — CLOPIDOGREL BISULFATE 75 MG PO TABS
75.0000 mg | ORAL_TABLET | Freq: Every day | ORAL | Status: DC
  Administered 2011-08-01 – 2011-08-05 (×5): 75 mg via ORAL
  Filled 2011-08-01 (×6): qty 1

## 2011-08-01 MED ORDER — PANTOPRAZOLE SODIUM 40 MG IV SOLR
40.0000 mg | Freq: Once | INTRAVENOUS | Status: AC
Start: 2011-08-01 — End: 2011-08-01
  Administered 2011-08-01: 40 mg via INTRAVENOUS
  Filled 2011-08-01: qty 40

## 2011-08-01 MED ORDER — TIMOLOL MALEATE 0.5 % OP SOLN
1.0000 [drp] | Freq: Two times a day (BID) | OPHTHALMIC | Status: DC
  Administered 2011-08-01 – 2011-08-05 (×8): 1 [drp] via OPHTHALMIC
  Filled 2011-08-01: qty 5

## 2011-08-01 MED ORDER — ASPIRIN EC 81 MG PO TBEC
81.0000 mg | DELAYED_RELEASE_TABLET | Freq: Every day | ORAL | Status: DC
  Administered 2011-08-01 – 2011-08-05 (×5): 81 mg via ORAL
  Filled 2011-08-01 (×6): qty 1

## 2011-08-01 MED ORDER — ONDANSETRON HCL 4 MG/2ML IJ SOLN: 4.0000 mg | Freq: Four times a day (QID) | INTRAMUSCULAR | Status: DC | PRN

## 2011-08-01 MED ORDER — DOCUSATE SODIUM 100 MG PO CAPS
100.0000 mg | ORAL_CAPSULE | Freq: Two times a day (BID) | ORAL | Status: DC
  Administered 2011-08-02 – 2011-08-05 (×6): 100 mg via ORAL
  Filled 2011-08-01 (×11): qty 1

## 2011-08-01 MED ORDER — OLMESARTAN MEDOXOMIL 40 MG PO TABS
40.0000 mg | ORAL_TABLET | Freq: Every day | ORAL | Status: DC
  Administered 2011-08-01 – 2011-08-05 (×5): 40 mg via ORAL
  Filled 2011-08-01 (×6): qty 1

## 2011-08-01 NOTE — ED Notes (Signed)
Per EMS, pt c/o diarrhea since last night, also c/o bilateral lower extremity swelling, new onset per pt

## 2011-08-01 NOTE — ED Notes (Signed)
Per PA, PA disimpacted "large amount of stool" from pt. Pt with several episodes of soft brown stool post impaction. Pt states "my belly feels better". Bedside commode provided for pt

## 2011-08-01 NOTE — ED Provider Notes (Signed)
History     CSN: [id number] Arrival date & time: 08/01/2011 10:35 AM   First MD Initiated Contact with Patient 08/01/11 1121      Chief Complaint  Patient presents with  . Diarrhea  . Leg Swelling    (Consider location/radiation/quality/duration/timing/severity/associated sxs/prior treatment) HPI Comments: Patient here after complaining of constipation for the past 3-4 days - she states she has been taking her laxative daily and nightly in the hopes of relieving this - states that last night she noticed that she still feels pressure in her rectum but is now having diarrhea around this pressure - she states that she still has the sensation to have a bowel movement, she denies abdominal pain just rectal discomfort.  She states that with the straining she has also developed periodic episodes of chest pain, reports pressure in nature, non-radiating, staying in the left anterior upper chest wall, also reports that she is having swelling bilaterally in both legs since the straining last night - she denies calf pain or tenderness, no difficulty walking, no shortness of breath.  Has a history of 2 stents placed in 2009  Patient is a 77 y.o. female presenting with diarrhea. The history is provided by the patient. No language interpreter was used.  Diarrhea The primary symptoms include diarrhea. Primary symptoms do not include fever, fatigue, abdominal pain, nausea, vomiting or dysuria. The illness began 3 to 5 days ago. The onset was gradual. The problem has not changed since onset. The illness is also significant for anorexia, bloating and constipation. The illness does not include chills, dysphagia or back pain.    Past Medical History  Diagnosis Date  . CAD (coronary artery disease)     Pt. develpoed chest burning w/raiation to her L arm /exertion in 08. She went to ER, found to have NSTEMI, LHC showed long 75-80% proximal to mid LAD stenosis w/99% ostial D2 stenosis and diffuse mild to moderate  RCA disease. She had 2 Cypher stents to the proximal to mid LAD. Echo (5/11): EF 60-65%, grade 1 diastolic dysfunction, normal wall motion, no significant valvular abnormalities.   . S/P subtotal thyroidectomy   . HTN (hypertension)   . Hyperlipidemia   . Iron deficiency anemia     with normal colonoscopy last year per her report.   . Osteoarthritis of hip   . H/O: hysterectomy     History reviewed. No pertinent past surgical history.  History reviewed. No pertinent family history.  History  Substance Use Topics  . Smoking status: Never Smoker   . Smokeless tobacco: Not on file  . Alcohol Use: No    OB History    Grav Para Term Preterm Abortions TAB SAB Ect Mult Living                  Review of Systems  Constitutional: Negative.  Negative for fever, chills and fatigue.  HENT: Negative.   Eyes: Negative.   Respiratory: Positive for chest tightness. Negative for cough and shortness of breath.   Cardiovascular: Positive for chest pain and leg swelling.  Gastrointestinal: Positive for diarrhea, constipation, abdominal distention, rectal pain, bloating and anorexia. Negative for dysphagia, nausea, vomiting, abdominal pain, blood in stool and anal bleeding.  Genitourinary: Negative for dysuria.  Musculoskeletal: Negative.  Negative for back pain.  Skin: Negative.   Neurological: Negative.   Hematological: Negative.   Psychiatric/Behavioral: Negative.     Allergies  Review of patient's allergies indicates no known allergies.  Home Medications   Current  Outpatient Rx  Name Route Sig Dispense Refill  . AMLODIPINE BESYLATE 10 MG PO TABS Oral Take 10 mg by mouth daily.      . ASPIRIN 81 MG PO TABS Oral Take 81 mg by mouth daily.      Marland Kitchen CALCIUM 600-200 MG-UNIT PO TABS Oral Take 1 tablet by mouth 2 (two) times daily.      Marland Kitchen CLOPIDOGREL BISULFATE 75 MG PO TABS Oral Take 75 mg by mouth daily.      Marland Kitchen DOCUSATE SODIUM 100 MG PO CAPS Oral Take 100 mg by mouth as needed.     Marland Kitchen  LATANOPROST 0.005 % OP SOLN Both Eyes Place 1 drop into both eyes Daily.    Marland Kitchen METOPROLOL TARTRATE 25 MG PO TABS Oral Take 25 mg by mouth 2 (two) times daily.      Marland Kitchen OLMESARTAN MEDOXOMIL-HCTZ 40-25 MG PO TABS Oral Take 1 tablet by mouth daily.      Marland Kitchen FISH OIL 1000 MG PO CAPS Oral Take 1 capsule by mouth daily.      Marland Kitchen ROSUVASTATIN CALCIUM 5 MG PO TABS Oral Take 5 mg by mouth daily.      Marland Kitchen TIMOLOL MALEATE 0.5 % OP SOLN Both Eyes Place 1 drop into both eyes 2 (two) times daily.        BP 116/62  Pulse 63  Temp(Src) 97.8 F (36.6 C) (Oral)  SpO2 96%  Physical Exam  Nursing note and vitals reviewed. Constitutional: She is oriented to person, place, and time. She appears well-developed and well-nourished.  HENT:  Head: Normocephalic and atraumatic.  Right Ear: External ear normal.  Left Ear: External ear normal.  Mouth/Throat: Oropharynx is clear and moist.  Eyes: Conjunctivae are normal. Pupils are equal, round, and reactive to light.  Neck: Normal range of motion. Neck supple. No JVD present.  Cardiovascular: Normal rate, regular rhythm, normal heart sounds and intact distal pulses.  Exam reveals no gallop and no friction rub.   No murmur heard. Pulmonary/Chest: Effort normal and breath sounds normal. No respiratory distress. She has no wheezes. She has no rales. She exhibits no tenderness.  Abdominal: Soft. She exhibits no distension, no ascites and no mass. Bowel sounds are increased. There is no tenderness.  Genitourinary: Rectal exam shows external hemorrhoid. Rectal exam shows no tenderness and anal tone normal.       Large ball of stool in distal rectal vault, oozing stool from around this  Musculoskeletal: Normal range of motion.  Neurological: She is alert and oriented to person, place, and time. No cranial nerve deficit.  Skin: Skin is warm and dry.  Psychiatric: She has a normal mood and affect. Her behavior is normal. Judgment and thought content normal.    ED Course  Fecal  disimpaction Date/Time: 08/01/2011 12:36 PM Performed by: Marisue Humble, Arnita Koons C. Authorized by: Patrecia Pour Consent: Verbal consent obtained. Written consent not obtained. Risks and benefits: risks, benefits and alternatives were discussed Consent given by: patient Patient understanding: patient states understanding of the procedure being performed Patient consent: the patient's understanding of the procedure does not match consent given Procedure consent: procedure consent does not match procedure scheduled Relevant documents: relevant documents not present or verified Test results: test results not available Site marked: the operative site was not marked Imaging studies: imaging studies not available Patient identity confirmed: verbally with patient and arm band Time out: Immediately prior to procedure a "time out" was called to verify the correct patient, procedure, equipment, support  staff and site/side marked as required. Local anesthesia used: no Patient sedated: no Patient tolerance: Patient tolerated the procedure well with no immediate complications. Comments: Removed large amount of stool from the rectal vault, patient continues with liquid diarrhea after disimpaction   (including critical care time)   Labs Reviewed  CARDIAC PANEL(CRET KIN+CKTOT+MB+TROPI)  CBC  DIFFERENTIAL  BASIC METABOLIC PANEL   No results found.  Date: 08/01/2011  Rate: 69  Rhythm: sinus arrhythmia  QRS Axis: normal  Intervals: normal  ST/T Wave abnormalities: normal  Conduction Disutrbances:right bundle branch block and left anterior fascicular block  Narrative Interpretation: reviewed by Dr. Fredricka Bonine  Old EKG Reviewed: unchanged  Chest pain Lower GI bleed Anemia   MDM  Continues to complain of chest pain after disimpaction - will get EKG and two sets of enzymes, doubt true cardiac emergency.  Reports feels better after disimpaction.      Spoke with Dr. Janee Morn who will admit  the patient for lower GI bleed, occult in nature - will get GI consult, will see Team 5.  Plan to call GI for consult while here as well.  Will admit to telemetry bed.  Spoke with Dr. Loreta Ave with GI who requests that she be called when the patient in settled in a bed, I will let Dr. Janee Morn know this.  Izola Price [place], [place] 08/01/11 1501

## 2011-08-01 NOTE — H&P (Signed)
Bonnie Morton MRN: [id number] DOB/AGE: 75-Nov-1929 75 y.o. Primary Care Physician:SHELTON,KIMBERLY R., MD Admit date: 08/01/2011 Chief Complaint: Constipation HPI:  Bonnie Morton is a pleasant 77 year old African American female, retired Engineer, civil (consulting), with a history of chronic constipation, history of coronary artery disease status post Cypher stent x2 to the proximal and mid LAD in March 2008, history of hypertension history of iron deficiency anemia with normal colonoscopy per patient in 2010, history of subtotal thyroidectomy who presented to the ED with worsening low abdominal pain and constipation. Patient states that she does have a chronic constipation which is usually relieved with over-the-counter laxatives. Patient states that over the past 2-3 weeks she's been having problems with constipation, with some loose pasty stools. Patient denies any hematemesis no hematochezia no melena. Patient states that usually while straining she usually develops a burning sensation in head left substernal region which is non-radiating intermittent in nature and lasts only a few minutes with some associated belching. Patient denies any shortness of breath no palpitations no radiation. Patient states during these times she usually takes an extra aspirin and extra dose of Plavix that she's taking before in the past. Patient states that the night prior to admission she was felt severely constipated, with lower abdominal pain, and a pressure in her rectum. Patient states that she's having some loose stools around this pressure but has not had a regular bowel movement. Patient presented to the ED x-rays which were done with consistent with a constipation patient was disimpacted. ED PA with improvement in abdominal pain and distention. Patient did also undergo some abdominal distention. And some bilateral lower extremity swelling that has been ongoing part of the past several weeks. Patient denies any fever no chills no nausea  no vomiting no dysuria no cough no use of Goody's powders. She does endorse a mild generalized weakness. In the ED CBC which was done did show a hemoglobin of 5.5, Hemoccult per ED PA was positive. Will call to admit the patient for further evaluation and management.  Past Medical History  Diagnosis Date  . CAD (coronary artery disease)     Pt. develpoed chest burning w/raiation to her L arm /exertion in 08. She went to ER, found to have NSTEMI, LHC showed long 75-80% proximal to mid LAD stenosis w/99% ostial D2 stenosis and diffuse mild to moderate RCA disease. She had 2 Cypher stents to the proximal to mid LAD. Echo (5/11): EF 60-65%, grade 1 diastolic dysfunction, normal wall motion, no significant valvular abnormalities.   . S/P subtotal thyroidectomy   . HTN (hypertension)   . Hyperlipidemia   . Iron deficiency anemia     with normal colonoscopy last year per her report.   . Osteoarthritis of hip   . H/O: hysterectomy   . Glaucoma     Past Surgical History  Procedure Date  . Eye surgery     R eye cataract surgery    Prior to Admission medications   Medication Sig Start Date End Date Taking? Authorizing Provider  amLODipine (NORVASC) 10 MG tablet Take 10 mg by mouth daily.    Yes Historical Provider, MD  aspirin 81 MG tablet Take 81 mg by mouth daily.    Yes Historical Provider, MD  Calcium 600-200 MG-UNIT per tablet Take 1 tablet by mouth 2 (two) times daily.    Yes Historical Provider, MD  clopidogrel (PLAVIX) 75 MG tablet Take 75 mg by mouth daily.    Yes Historical Provider, MD  docusate sodium (COLACE) 100  MG capsule Take 100 mg by mouth as needed.    Yes Historical Provider, MD  latanoprost (XALATAN) 0.005 % ophthalmic solution Place 1 drop into both eyes Daily. 02/27/11  Yes Historical Provider, MD  metoprolol tartrate (LOPRESSOR) 25 MG tablet Take 12.5 mg by mouth 2 (two) times daily.    Yes Historical Provider, MD  olmesartan-hydrochlorothiazide (BENICAR HCT) 40-25 MG per  tablet Take 1 tablet by mouth daily.    Yes Historical Provider, MD  Omega-3 Fatty Acids (FISH OIL) 1000 MG CAPS Take 1 capsule by mouth daily.    Yes Historical Provider, MD  rosuvastatin (CRESTOR) 5 MG tablet Take 5 mg by mouth daily.    Yes Historical Provider, MD  timolol (TIMOPTIC) 0.5 % ophthalmic solution Place 1 drop into both eyes 2 (two) times daily.    Yes Historical Provider, MD    Allergies: No Known Allergies  Family History  Problem Relation Age of Onset  . Hypertension Mother   . Lung cancer Father     Social History:  reports that she has never smoked. She does not have any smokeless tobacco history on file. She reports that she does not drink alcohol. Her drug history not on file.  ROS: All systems reviewed with the patient and was positive as per HPI otherwise all other systems are negative.  PHYSICAL EXAM: Blood pressure 112/59, pulse 64, temperature 98.1 F (36.7 C), temperature source Oral, resp. rate 16, SpO2 98.00%. General: Well-developed well-nourished female in no acute cardiopulmonary distress.  HEENT: Normocephalic atraumatic. Pupils equal round and reactive to light and accommodation. Extraocular movements intact. Oropharynx clear no lesions no exudates. Neck is supple with no lymphadenopathy. Patient does have a scar on her neck.No bruits, no goiter. Heart: Regular rate and rhythm, without murmurs, rubs, gallops. Lungs: Clear to auscultation bilaterally. Abdomen: Soft, nontender, nondistended, positive bowel sounds. Extremities: No clubbing cyanosis. 1-2+ bilateral lower extremity edema. Neuro: Grossly intact, nonfocal.   EKG: Incomplete right bundle branch block . Unchanged from prior EKG   No results found for this or any previous visit (from the past 240 hour(s)).   Lab results:  Basename 08/01/11 1230  NA 127*  K 3.5  CL 91*  CO2 25  GLUCOSE 104*  BUN 47*  CREATININE 1.15*  CALCIUM 9.3  MG --  PHOS --   No results found for this  basename: AST:2,ALT:2,ALKPHOS:2,BILITOT:2,PROT:2,ALBUMIN:2 in the last 72 hours No results found for this basename: LIPASE:2,AMYLASE:2 in the last 72 hours  Basename 08/01/11 1230  WBC 9.4  NEUTROABS 6.7  HGB 5.5*  HCT 19.6*  MCV 66.0*  PLT 511*    Basename 08/01/11 1230  CKTOTAL 107  CKMB 2.8  CKMBINDEX --  TROPONINI <0.30   No results found for this basename: POCBNP:3 in the last 72 hours No results found for this basename: DDIMER in the last 72 hours No results found for this basename: HGBA1C:2 in the last 72 hours No results found for this basename: CHOL:2,HDL:2,LDLCALC:2,TRIG:2,CHOLHDL:2,LDLDIRECT:2 in the last 72 hours No results found for this basename: TSH,T4TOTAL,FREET3,T3FREE,THYROIDAB in the last 72 hours No results found for this basename: VITAMINB12:2,FOLATE:2,FERRITIN:2,TIBC:2,IRON:2,RETICCTPCT:2 in the last 72 hours Imaging results:  Dg Abd Acute W/chest  08/01/2011  *RADIOLOGY REPORT*  Clinical Data: Constipation and chest pain  ACUTE ABDOMEN SERIES (ABDOMEN 2 VIEW & CHEST 1 VIEW)  Comparison: None  Findings: The heart size appears normal.  There is no pleural effusion or pulmonary edema identified.  There is no airspace consolidation identified.  The bowel  gas pattern appears non obstructed.  Moderate stool burden is identified throughout the colon and up to the rectum consistent with the clinical history of constipation.  IMPRESSION:  1.  No active cardiopulmonary abnormality. 2.  Moderate stool burden throughout the colon consistent with constipation.  Original Report Authenticated By: Rosealee Albee, M.D.   Impression/Plan:  Principal Problem:  *Anemia Active Problems: Chest pain  Hyponatremia  Constipation  ESSENTIAL HYPERTENSION LE edema  #1. Anemia- unknown etiology. Patient does have a history of iron deficiency anemia. Patient denies any overt gross GI bleed. However FOBT was positive. Patient does have a hemoglobin of 5.5. Patient with a complaints  of stomach and chest burning. May have a slow upper GI bleed. We'll admit patient to telemetry. Check an anemia panel. Transfuse 3 units packed red blood cells. Will give IV Lasix in between transfusions. Placed on a PPI. We'll consult with GI for further evaluation and recommendations. Patient does have some complaints of belching and a burning sensation, question possible GERD versus peptic ulcer disease. #2. Chest pain-the patient's chest pain sounds atypical. Likely GI related. Patient however does have a significant cardiac history and a such will cycle cardiac enzymes every 8 hours x3. Patient's EKG is unchanged from prior EKG. Continue patient's beta blocker and ARB. Place on a proton pump inhibitor. Follow.  #3. Hyponatremia-likely secondary to volume depletion. Will check a serum osmolality, urine osmolality, a FENA, will hold patient's diuretics of HCTZ. Patient is being transfused. Follow. #4. Constipation- patient is status post disimpaction and feels better. We'll place on MiraLAX daily. Colace twice daily. Senokot when necessary. #5. Hypertension-continue home dose beta blocker and ARB. #6. Bilateral lower extremity edema - questionable etiology. Patient is not in volume overload. We'll check a statement to follow patient's liver enzymes. Patient is on Norvasc which can cause lower extremity edema. We will hold patient's Norvasc and monitor. #7. Prophylaxis-PPI for GI, SCDs for DVT   Bonnie Morton 08/01/2011, 5:20 PM

## 2011-08-01 NOTE — ED Notes (Signed)
Lab called with HGB 5.5, MD aware

## 2011-08-02 DIAGNOSIS — R195 Other fecal abnormalities: Secondary | ICD-10-CM

## 2011-08-02 DIAGNOSIS — E876 Hypokalemia: Secondary | ICD-10-CM | POA: Diagnosis not present

## 2011-08-02 DIAGNOSIS — D509 Iron deficiency anemia, unspecified: Secondary | ICD-10-CM

## 2011-08-02 LAB — COMPREHENSIVE METABOLIC PANEL
AST: 16 U/L (ref 0–37)
Albumin: 3 g/dL — ABNORMAL LOW (ref 3.5–5.2)
BUN: 40 mg/dL — ABNORMAL HIGH (ref 6–23)
Calcium: 8.4 mg/dL (ref 8.4–10.5)
Chloride: 93 mEq/L — ABNORMAL LOW (ref 96–112)
Creatinine, Ser: 1.22 mg/dL — ABNORMAL HIGH (ref 0.50–1.10)
Total Bilirubin: 0.8 mg/dL (ref 0.3–1.2)

## 2011-08-02 LAB — URINALYSIS, ROUTINE W REFLEX MICROSCOPIC
Bilirubin Urine: NEGATIVE
Glucose, UA: NEGATIVE mg/dL
Hgb urine dipstick: NEGATIVE
Ketones, ur: NEGATIVE mg/dL
Nitrite: NEGATIVE
Specific Gravity, Urine: 1.008 (ref 1.005–1.030)
pH: 7.5 (ref 5.0–8.0)

## 2011-08-02 LAB — CARDIAC PANEL(CRET KIN+CKTOT+MB+TROPI)
Relative Index: INVALID (ref 0.0–2.5)
Total CK: 94 U/L (ref 7–177)

## 2011-08-02 LAB — PREPARE RBC (CROSSMATCH)

## 2011-08-02 LAB — CBC
HCT: 30.8 % — ABNORMAL LOW (ref 36.0–46.0)
Hemoglobin: 7.9 g/dL — ABNORMAL LOW (ref 12.0–15.0)
MCH: 22.6 pg — ABNORMAL LOW (ref 26.0–34.0)
MCH: 24 pg — ABNORMAL LOW (ref 26.0–34.0)
MCHC: 32.8 g/dL (ref 30.0–36.0)
MCV: 70.8 fL — ABNORMAL LOW (ref 78.0–100.0)
Platelets: 394 10*3/uL (ref 150–400)
RBC: 3.49 MIL/uL — ABNORMAL LOW (ref 3.87–5.11)
RDW: 20.4 % — ABNORMAL HIGH (ref 11.5–15.5)
WBC: 7.4 10*3/uL (ref 4.0–10.5)

## 2011-08-02 LAB — OSMOLALITY: Osmolality: 284 mOsm/kg (ref 275–300)

## 2011-08-02 LAB — OSMOLALITY, URINE: Osmolality, Ur: 225 mOsm/kg — ABNORMAL LOW (ref 390–1090)

## 2011-08-02 LAB — MAGNESIUM: Magnesium: 4.7 mg/dL — ABNORMAL HIGH (ref 1.5–2.5)

## 2011-08-02 LAB — CREATININE, URINE, RANDOM: Creatinine, Urine: 27.7 mg/dL

## 2011-08-02 MED ORDER — POTASSIUM CHLORIDE CRYS ER 20 MEQ PO TBCR
40.0000 meq | EXTENDED_RELEASE_TABLET | ORAL | Status: AC
  Administered 2011-08-02 (×2): 40 meq via ORAL
  Filled 2011-08-02 (×2): qty 2

## 2011-08-02 NOTE — Progress Notes (Signed)
Subjective: Patient states she feels much better. No chest pain. Patient's burning has improved.  Objective: Vital signs in last 24 hours: Filed Vitals:   08/02/11 1230 08/02/11 1300 08/02/11 1400 08/02/11 1500  BP: 103/66 104/70 107/66 109/63  Pulse: 58 61 62 60  Temp: 98.1 F (36.7 C) 97.8 F (36.6 C) 98 F (36.7 C) 97.9 F (36.6 C)  TempSrc: Oral Oral Oral Oral  Resp: 16 16 16 16   Height:      Weight:      SpO2:        Intake/Output Summary (Last 24 hours) at 08/02/11 1534 Last data filed at 08/02/11 1300  Gross per 24 hour  Intake 1454.17 ml  Output    600 ml  Net 854.17 ml    Weight change:   General: Alert, awake, oriented x3, in no acute distress. HEENT: No bruits, no goiter. Heart: Regular rate and rhythm, without murmurs, rubs, gallops. Lungs: Clear to auscultation bilaterally. Abdomen: Soft, nontender, nondistended, positive bowel sounds. Extremities: No clubbing cyanosis or edema with positive pedal pulses. Neuro: Grossly intact, nonfocal.   Lab Results:  Basename 08/02/11 0320 08/01/11 1230  NA 128* 127*  K 3.3* 3.5  CL 93* 91*  CO2 27 25  GLUCOSE 87 104*  BUN 40* 47*  CREATININE 1.22* 1.15*  CALCIUM 8.4 9.3  MG 4.7* --  PHOS -- --    Basename 08/02/11 0320  AST 16  ALT 9  ALKPHOS 69  BILITOT 0.8  PROT 6.3  ALBUMIN 3.0*   No results found for this basename: LIPASE:2,AMYLASE:2 in the last 72 hours  Basename 08/02/11 0320 08/01/11 1230  WBC 7.4 9.4  NEUTROABS -- 6.7  HGB 7.9* 5.5*  HCT 24.7* 19.6*  MCV 70.8* 66.0*  PLT 394 511*    Basename 08/02/11 0320 08/01/11 1922 08/01/11 1230  CKTOTAL 94 98 107  CKMB 2.4 2.9 2.8  CKMBINDEX -- -- --  TROPONINI <0.30 <0.30 <0.30   No results found for this basename: POCBNP:3 in the last 72 hours No results found for this basename: DDIMER:2 in the last 72 hours No results found for this basename: HGBA1C:2 in the last 72 hours No results found for this basename:  CHOL:2,HDL:2,LDLCALC:2,TRIG:2,CHOLHDL:2,LDLDIRECT:2 in the last 72 hours No results found for this basename: TSH,T4TOTAL,FREET3,T3FREE,THYROIDAB in the last 72 hours  Basename 08/01/11 1735  VITAMINB12 685  FOLATE 17.2  FERRITIN 6*  TIBC 441  IRON 19*  RETICCTPCT 1.0    Micro Results: No results found for this or any previous visit (from the past 240 hour(s)).  Studies/Results: Dg Abd Acute W/chest  08/01/2011  *RADIOLOGY REPORT*  Clinical Data: Constipation and chest pain  ACUTE ABDOMEN SERIES (ABDOMEN 2 VIEW & CHEST 1 VIEW)  Comparison: None  Findings: The heart size appears normal.  There is no pleural effusion or pulmonary edema identified.  There is no airspace consolidation identified.  The bowel gas pattern appears non obstructed.  Moderate stool burden is identified throughout the colon and up to the rectum consistent with the clinical history of constipation.  IMPRESSION:  1.  No active cardiopulmonary abnormality. 2.  Moderate stool burden throughout the colon consistent with constipation.  Original Report Authenticated By: Rosealee Albee, M.D.    Medications:    . aspirin EC  81 mg Oral Daily  . clopidogrel  75 mg Oral Daily  . docusate sodium  100 mg Oral BID  . furosemide  20 mg Intravenous Once  . influenza  inactive virus vaccine  0.5 mL Intramuscular Tomorrow-1000  . latanoprost  1 drop Both Eyes Daily  . metoprolol tartrate  12.5 mg Oral BID  . olmesartan  40 mg Oral Daily  . omega-3 acid ethyl esters  1 g Oral Daily  . pantoprazole  40 mg Oral Q0600  . pantoprazole (PROTONIX) IV  40 mg Intravenous Once  . polyethylene glycol  17 g Oral Daily  . potassium chloride  40 mEq Oral Q4H  . rosuvastatin  5 mg Oral Daily  . timolol  1 drop Both Eyes BID  . DISCONTD: aspirin  81 mg Oral Daily    Assessment/Plan Principal Problem:  *Anemia Active Problems: Chest pain  Hyponatremia  Constipation  ESSENTIAL HYPERTENSION Lower extremity edema   Hypokalemia   1. anemia-patient is FOBT positive. However no gross overt GI bleed. Anemia panel done was consistent with anemia of chronic disease/iron deficiency anemia. Patient is currently on her third unit of packed red blood cells with clinical improvement. Continue proton pump inhibitor. GI has seen in consult on patient patient is scheduled for upper EGD tomorrow. GI is following and appreciate input and recommendations. 2. chest pain-likely secondary to a GI etiology. Cardiac enzymes negative x3. No further chest pain/burning sensation. Continue proton pump inhibitor. Patient for EGD tomorrow. 2. hyponatremia-unknown etiology. Urine studies pending. HCTZ on hold. Slowly improving 3. constipation-status post manual disimpaction. Clinical improvement. Continue current bowel regimen. 4. Hypokalemia- replete 5. hypertension- Continue beta blocker and ARB. HCTZ on hold secondary to #2. 6. bilateral lower extremity edema-- questionable etiology may be secondary to calcium channel blocker. Improved no edema today. LFTs are within normal limits. Urinalysis is negative for proteinuria. No signs or symptoms of heart failure. Norvasc on hold. Follow. 7. prophylaxis-PPI for GI, SCDs for DVT.    LOS: 1 day   [facility] 08/02/2011, 3:34 PM

## 2011-08-02 NOTE — Progress Notes (Signed)
Subjective I feel much better  Objective: Vital signs in last 24 hours: Temp:  [97.6 F (36.4 C)-98.5 F (36.9 C)] 97.8 F (36.6 C) (11/24 0602) Pulse Rate:  [48-89] 66  (11/24 0812) Resp:  [15-18] 16  (11/24 0602) BP: (99-150)/(46-83) 120/62 mmHg (11/24 0812) SpO2:  [95 %-100 %] 100 % (11/24 0602) Weight:  [65.545 kg (144 lb 8 oz)-66.9 kg (147 lb 7.8 oz)] 147 lb 7.8 oz (66.9 kg) (11/24 0602) Last BM Date: 08/01/11 General:   Alert,  pleasant, cooperative in NAD Head:  Normocephalic and atraumatic. Eyes:  Sclera clear, no icterus.   Conjunctiva pink. Mouth:  No deformity or lesions, dentition normal. Neck:  Supple; no masses or thyromegaly. Heart:  Regular rate and rhythm; no murmurs, clicks, rubs,  or gallops. Lungs:  No wheezes or rales Abdomen:  Soft, nontender, no distention, positive bowl sounds  Msk:  Symmetrical without gross deformities. Normal posture. Pulses:  Normal pulses noted. Extremities:  Without clubbing or edema. Neurologic:  Alert and  oriented x4;  grossly normal neurologically. Skin:  Intact without significant lesions or rashes. Rectal exam : soft heme positive stool Intake/Output from previous day: 11/23 0701 - 11/24 0700 In: 1229.2 [P.O.:200; I.V.:275; Blood:654.2] Out: -  Intake/Output this shift:    Lab Results:  Basename 08/02/11 0320 08/01/11 1230  WBC 7.4 9.4  HGB 7.9* 5.5*  HCT 24.7* 19.6*  PLT 394 511*   BMET  Basename 08/02/11 0320 08/01/11 1230  NA 128* 127*  K 3.3* 3.5  CL 93* 91*  CO2 27 25  GLUCOSE 87 104*  BUN 40* 47*  CREATININE 1.22* 1.15*  CALCIUM 8.4 9.3   LFT  Basename 08/02/11 0320  PROT 6.3  ALBUMIN 3.0*  AST 16  ALT 9  ALKPHOS 69  BILITOT 0.8  BILIDIR --  IBILI --   PT/INR  Basename 08/01/11 1922  LABPROT 14.6  INR 1.12   Hepatitis Panel No results found for this basename: HEPBSAG,HCVAB,HEPAIGM,HEPBIGM in the last 72 hours  Studies/Results: Dg Abd Acute W/chest  08/01/2011  *RADIOLOGY REPORT*   Clinical Data: Constipation and chest pain  ACUTE ABDOMEN SERIES (ABDOMEN 2 VIEW & CHEST 1 VIEW)  Comparison: None  Findings: The heart size appears normal.  There is no pleural effusion or pulmonary edema identified.  There is no airspace consolidation identified.  The bowel gas pattern appears non obstructed.  Moderate stool burden is identified throughout the colon and up to the rectum consistent with the clinical history of constipation.  IMPRESSION:  1.  No active cardiopulmonary abnormality. 2.  Moderate stool burden throughout the colon consistent with constipation.  Original Report Authenticated By: Rosealee Albee, M.D.     ASSESSMENT:   Principal Problem:  *Anemia Active Problems:  ESSENTIAL HYPERTENSION  Hyponatremia  Constipation  Occult GI blood loss, pt had a recent colonoscopy by Dr Loreta Ave, we will proceed with EGD tomorrow am   PLAN:   EGD 08/02/2010, discussed with the pt     LOS: 1 day   Lina Sar  08/02/2011, 8:15 AM

## 2011-08-03 ENCOUNTER — Encounter (HOSPITAL_COMMUNITY): Payer: Self-pay | Admitting: *Deleted

## 2011-08-03 ENCOUNTER — Other Ambulatory Visit: Payer: Self-pay | Admitting: Internal Medicine

## 2011-08-03 ENCOUNTER — Encounter (HOSPITAL_COMMUNITY)
Admission: EM | Disposition: A | Discharge status: Home / Self Care | Payer: Self-pay | Source: Home / Self Care | Attending: Internal Medicine

## 2011-08-03 DIAGNOSIS — D509 Iron deficiency anemia, unspecified: Secondary | ICD-10-CM

## 2011-08-03 DIAGNOSIS — K279 Peptic ulcer, site unspecified, unspecified as acute or chronic, without hemorrhage or perforation: Secondary | ICD-10-CM | POA: Diagnosis present

## 2011-08-03 DIAGNOSIS — R195 Other fecal abnormalities: Secondary | ICD-10-CM

## 2011-08-03 HISTORY — PX: ESOPHAGOGASTRODUODENOSCOPY: SHX5428

## 2011-08-03 LAB — BASIC METABOLIC PANEL
BUN: 30 mg/dL — ABNORMAL HIGH (ref 6–23)
Calcium: 8.9 mg/dL (ref 8.4–10.5)
GFR calc Af Amer: 54 mL/min — ABNORMAL LOW (ref >90)
GFR calc non Af Amer: 47 mL/min — ABNORMAL LOW (ref >90)
Potassium: 4 mEq/L (ref 3.5–5.1)
Sodium: 133 mEq/L — ABNORMAL LOW (ref 135–145)

## 2011-08-03 LAB — CBC
Hemoglobin: 9.4 g/dL — ABNORMAL LOW (ref 12.0–15.0)
MCHC: 31.8 g/dL (ref 30.0–36.0)
Platelets: 421 10*3/uL — ABNORMAL HIGH (ref 150–400)
RDW: 20.9 % — ABNORMAL HIGH (ref 11.5–15.5)

## 2011-08-03 SURGERY — EGD (ESOPHAGOGASTRODUODENOSCOPY)
Anesthesia: Moderate Sedation

## 2011-08-03 MED ORDER — FENTANYL NICU IV SYRINGE 50 MCG/ML
INJECTION | INTRAMUSCULAR | Status: DC | PRN
  Administered 2011-08-03: 25 ug via INTRAVENOUS

## 2011-08-03 MED ORDER — SUCRALFATE 1 G PO TABS
1.0000 g | ORAL_TABLET | Freq: Three times a day (TID) | ORAL | Status: DC
  Administered 2011-08-03 – 2011-08-05 (×10): 1 g via ORAL
  Filled 2011-08-03 (×15): qty 1

## 2011-08-03 MED ORDER — MIDAZOLAM HCL 10 MG/2ML IJ SOLN
INTRAMUSCULAR | Status: DC | PRN
  Administered 2011-08-03: 2 mg via INTRAVENOUS

## 2011-08-03 MED ORDER — SODIUM CHLORIDE 0.9 % IV SOLN
INTRAVENOUS | Status: DC
  Administered 2011-08-03: 500 mL via INTRAVENOUS

## 2011-08-03 MED ORDER — BUTAMBEN-TETRACAINE-BENZOCAINE 2-2-14 % EX AERO
INHALATION_SPRAY | CUTANEOUS | Status: DC | PRN
  Administered 2011-08-03 (×2): 1 via TOPICAL

## 2011-08-03 NOTE — Interval H&P Note (Signed)
History and Physical Interval Note:   08/03/2011   8:01 AM   Bonnie Morton  has presented today for surgery, with the diagnosis of Anemia  The various methods of treatment have been discussed with the patient and family. After consideration of risks, benefits and other options for treatment, the patient has consented to  Procedure(s): ESOPHAGOGASTRODUODENOSCOPY (EGD) as a surgical intervention .  The patients' history has been reviewed, patient examined, no change in status, stable for surgery.  I have reviewed the patients' chart and labs.  Questions were answered to the patient's satisfaction.     Lina Sar  MD

## 2011-08-03 NOTE — Progress Notes (Signed)
Subjective: Patient states she feels much better. No chest pain. Patient's abdominal burning has improved.  Objective: Vital signs in last 24 hours: Filed Vitals:   08/03/11 0920 08/03/11 0930 08/03/11 0956 08/03/11 1321  BP: 101/40 99/40 114/72 107/58  Pulse:   85 60  Temp:   97.8 F (36.6 C) 98.6 F (37 C)  TempSrc:   Oral Oral  Resp: 12 24 18 20   Height:      Weight:      SpO2: 96% 92%      Intake/Output Summary (Last 24 hours) at 08/03/11 1512 Last data filed at 08/03/11 1610  Gross per 24 hour  Intake 1263.14 ml  Output    300 ml  Net 963.14 ml    Weight change: 1.155 kg (2 lb 8.8 oz)  General: Alert, awake, oriented x3, in no acute distress. HEENT: No bruits, no goiter. Heart: Regular rate and rhythm, without murmurs, rubs, gallops. Lungs: Clear to auscultation bilaterally. Abdomen: Soft, nontender, nondistended, positive bowel sounds. Extremities: No clubbing cyanosis or edema with positive pedal pulses. Neuro: Grossly intact, nonfocal.   Lab Results:  [facility] 08/03/11 0543 08/02/11 0320  NA 133* 128*  K 4.0 3.3*  CL 99 93*  CO2 25 27  GLUCOSE 80 87  BUN 30* 40*  CREATININE 1.08 1.22*  CALCIUM 8.9 8.4  MG -- 4.7*  PHOS -- --    Basename 08/02/11 0320  AST 16  ALT 9  ALKPHOS 69  BILITOT 0.8  PROT 6.3  ALBUMIN 3.0*   No results found for this basename: LIPASE:2,AMYLASE:2 in the last 72 hours  Basename 08/03/11 0543 08/02/11 1830 08/01/11 1230  WBC 7.7 7.7 --  NEUTROABS -- -- 6.7  HGB 9.4* 10.1* --  HCT 29.6* 30.8* --  MCV 73.1* 73.2* --  PLT 421* 430* --    Basename 08/02/11 0320 08/01/11 1922 08/01/11 1230  CKTOTAL 94 98 107  CKMB 2.4 2.9 2.8  CKMBINDEX -- -- --  TROPONINI <0.30 <0.30 <0.30   No results found for this basename: POCBNP:3 in the last 72 hours No results found for this basename: DDIMER:2 in the last 72 hours No results found for this basename: HGBA1C:2 in the last 72 hours No results found for this basename:  CHOL:2,HDL:2,LDLCALC:2,TRIG:2,CHOLHDL:2,LDLDIRECT:2 in the last 72 hours No results found for this basename: TSH,T4TOTAL,FREET3,T3FREE,THYROIDAB in the last 72 hours  Basename 08/01/11 1735  VITAMINB12 685  FOLATE 17.2  FERRITIN 6*  TIBC 441  IRON 19*  RETICCTPCT 1.0    Micro Results: Recent Results (from the past 240 hour(s))  URINE CULTURE     Status: Normal (Preliminary result)   Collection Time   08/02/11  2:20 PM      Component Value Range Status Comment   Specimen Description URINE, CLEAN CATCH   Final    Special Requests NONE   Final    Setup Time [id number]   Final    Colony Count 45,000 COLONIES/ML   Final    Culture GRAM NEGATIVE RODS   Final    Report Status PENDING   Incomplete     Studies/Results: No results found.  Medications:    . aspirin EC  81 mg Oral Daily  . clopidogrel  75 mg Oral Daily  . docusate sodium  100 mg Oral BID  . influenza  inactive virus vaccine  0.5 mL Intramuscular Tomorrow-1000  . latanoprost  1 drop Both Eyes Daily  . metoprolol tartrate  12.5 mg Oral BID  . olmesartan  40 mg Oral Daily  . omega-3 acid ethyl esters  1 g Oral Daily  . pantoprazole  40 mg Oral Q0600  . polyethylene glycol  17 g Oral Daily  . potassium chloride  40 mEq Oral Q4H  . rosuvastatin  5 mg Oral Daily  . sucralfate  1 g Oral TID WC & HS  . timolol  1 drop Both Eyes BID    Assessment/Plan Principal Problem:  *Anemia Active Problems: Chest pain  Hyponatremia  Constipation  ESSENTIAL HYPERTENSION Lower extremity edema  Hypokalemia   1. anemia-patient is FOBT positive. However no gross overt GI bleed. Anemia panel done was consistent with anemia of chronic disease/iron deficiency anemia. Patient is S/P 3 units of packed red blood cells with clinical improvement. Continue proton pump inhibitor. GI has seen in consult on patient. s/p EGD with 2 prepyloric and pyloric non-bleeding ulcers. GI is following and appreciate input and recommendations. 2.  chest pain-likely secondary to a GI etiology secondary to ulcers noted on EGD. Cardiac enzymes negative x3. No further chest pain/burning sensation. Continue proton pump inhibitor.  2. hyponatremia-unknown etiology. Urine studies pending. HCTZ on hold. Slowly improving. 3. constipation-status post manual disimpaction. Clinical improvement. Continue current bowel regimen. 4. Hypokalemia- replete 5. hypertension- Continue beta blocker and ARB. HCTZ on hold secondary to #2. 6. bilateral lower extremity edema-- questionable etiology may be secondary to calcium channel blocker. Improved no edema today. LFTs are within normal limits. Urinalysis is negative for proteinuria. No signs or symptoms of heart failure. Norvasc on hold. Follow. 7. prophylaxis-PPI for GI, SCDs for DVT.    LOS: 2 days   Bonnie Morton 08/03/2011, 3:12 PM

## 2011-08-03 NOTE — Brief Op Note (Signed)
Results of EGD:  2 prepyloric ans pyloric channel ulcers, not bleeding, no obstruction, appear benign biopsies pending

## 2011-08-03 NOTE — Op Note (Signed)
Please see EGD report under Procedures 

## 2011-08-03 NOTE — ED Provider Notes (Signed)
Evaluation and management procedures were performed by the PA/NP under my supervision/collaboration.   Burak Zerbe D Abigaelle Verley, MD 08/03/11 1723 

## 2011-08-04 ENCOUNTER — Encounter (HOSPITAL_COMMUNITY): Payer: Self-pay

## 2011-08-04 LAB — TYPE AND SCREEN
ABO/RH(D): B POS
Antibody Screen: NEGATIVE
Unit division: 0

## 2011-08-04 LAB — URINE CULTURE
Colony Count: 45000
Culture  Setup Time: 201211241738

## 2011-08-04 LAB — CBC
Hemoglobin: 9.9 g/dL — ABNORMAL LOW (ref 12.0–15.0)
Platelets: 415 10*3/uL — ABNORMAL HIGH (ref 150–400)
RBC: 4.3 MIL/uL (ref 3.87–5.11)
WBC: 8 10*3/uL (ref 4.0–10.5)

## 2011-08-04 LAB — BASIC METABOLIC PANEL
Calcium: 9.7 mg/dL (ref 8.4–10.5)
GFR calc Af Amer: 63 mL/min — ABNORMAL LOW (ref >90)
GFR calc non Af Amer: 54 mL/min — ABNORMAL LOW (ref >90)
Glucose, Bld: 80 mg/dL (ref 70–99)
Potassium: 4 mEq/L (ref 3.5–5.1)
Sodium: 134 mEq/L — ABNORMAL LOW (ref 135–145)

## 2011-08-04 MED ORDER — SODIUM CHLORIDE 0.9 % IJ SOLN
3.0000 mL | Freq: Two times a day (BID) | INTRAMUSCULAR | Status: DC
  Administered 2011-08-04 – 2011-08-05 (×3): 3 mL via INTRAVENOUS

## 2011-08-04 NOTE — Progress Notes (Signed)
Subjective: Patient states she feels much better. No chest pain. No complaints.  Objective: Vital signs in last 24 hours: Filed Vitals:   08/03/11 1321 08/03/11 2100 08/04/11 0458 08/04/11 1346  BP: 107/58 135/85 110/54 125/58  Pulse: 60 81 57 61  Temp: 98.6 F (37 C) 98.2 F (36.8 C) 98.1 F (36.7 C) 98.6 F (37 C)  TempSrc: Oral Oral Oral Oral  Resp: 20 20 18 18   Height:      Weight:   67.5 kg (148 lb 13 oz)   SpO2:  96% 99% 100%    Intake/Output Summary (Last 24 hours) at 08/04/11 1514 Last data filed at 08/04/11 1300  Gross per 24 hour  Intake    960 ml  Output      0 ml  Net    960 ml    Weight change: 0.8 kg (1 lb 12.2 oz)  General: Alert, awake, oriented x3, in no acute distress. HEENT: No bruits, no goiter. Heart: Regular rate and rhythm, without murmurs, rubs, gallops. Lungs: Clear to auscultation bilaterally. Abdomen: Soft, nontender, nondistended, positive bowel sounds. Extremities: No clubbing cyanosis or edema with positive pedal pulses. Neuro: Grossly intact, nonfocal.   Lab Results:  Basename 08/04/11 0450 08/03/11 0543 08/02/11 0320  NA 134* 133* --  K 4.0 4.0 --  CL 101 99 --  CO2 24 25 --  GLUCOSE 80 80 --  BUN 25* 30* --  CREATININE 0.96 1.08 --  CALCIUM 9.7 8.9 --  MG -- -- 4.7*  PHOS -- -- --    Basename 08/02/11 0320  AST 16  ALT 9  ALKPHOS 69  BILITOT 0.8  PROT 6.3  ALBUMIN 3.0*   No results found for this basename: LIPASE:2,AMYLASE:2 in the last 72 hours  Basename 08/04/11 0450 08/03/11 0543  WBC 8.0 7.7  NEUTROABS -- --  HGB 9.9* 9.4*  HCT 32.6* 29.6*  MCV 75.8* 73.1*  PLT 415* 421*    Basename 08/02/11 0320 08/01/11 1922  CKTOTAL 94 98  CKMB 2.4 2.9  CKMBINDEX -- --  TROPONINI <0.30 <0.30   No results found for this basename: POCBNP:3 in the last 72 hours No results found for this basename: DDIMER:2 in the last 72 hours No results found for this basename: HGBA1C:2 in the last 72 hours No results found for  this basename: CHOL:2,HDL:2,LDLCALC:2,TRIG:2,CHOLHDL:2,LDLDIRECT:2 in the last 72 hours No results found for this basename: TSH,T4TOTAL,FREET3,T3FREE,THYROIDAB in the last 72 hours  Basename 08/01/11 1735  VITAMINB12 685  FOLATE 17.2  FERRITIN 6*  TIBC 441  IRON 19*  RETICCTPCT 1.0    Micro Results: Recent Results (from the past 240 hour(s))  URINE CULTURE     Status: Normal   Collection Time   08/02/11  2:20 PM      Component Value Range Status Comment   Specimen Description URINE, CLEAN CATCH   Final    Special Requests NONE   Final    Setup Time [id number]   Final    Colony Count 45,000 COLONIES/ML   Final    Culture ENTEROBACTER AEROGENES   Final    Report Status 08/04/2011 FINAL   Final    Organism ID, Bacteria ENTEROBACTER AEROGENES   Final     Studies/Results: No results found.  Medications:    . aspirin EC  81 mg Oral Daily  . clopidogrel  75 mg Oral Daily  . docusate sodium  100 mg Oral BID  . latanoprost  1 drop Both Eyes Daily  .  metoprolol tartrate  12.5 mg Oral BID  . olmesartan  40 mg Oral Daily  . omega-3 acid ethyl esters  1 g Oral Daily  . pantoprazole  40 mg Oral Q0600  . polyethylene glycol  17 g Oral Daily  . rosuvastatin  5 mg Oral Daily  . sodium chloride  3 mL Intravenous Q12H  . sucralfate  1 g Oral TID WC & HS  . timolol  1 drop Both Eyes BID    Assessment/Plan Principal Problem:  *Anemia Active Problems: Chest pain  Hyponatremia  Constipation  ESSENTIAL HYPERTENSION Lower extremity edema  Hypokalemia   1. anemia- Anemia panel done was consistent with anemia of chronic disease/iron deficiency anemia.Likely secondary to ulcers. Patient is S/P 3 units of packed red blood cells with clinical improvement. H/H stable. Continue proton pump inhibitor. s/p EGD with 2 prepyloric and pyloric non-bleeding ulcers. Continue Carafate. Biopsies pending. GI is following and appreciate input and recommendations. 2. chest pain-likely secondary to  a GI etiology secondary to ulcers noted on EGD. Cardiac enzymes negative x3. No further chest pain/burning sensation. Continue proton pump inhibitor and Carafate 2. hyponatremia-unknown etiology. Urine studies pending. HCTZ on hold. Slowly improving. 3. constipation-status post manual disimpaction. Clinical improvement. Continue current bowel regimen. 4. Hypokalemia- replete 5. hypertension- Continue beta blocker and ARB. HCTZ on hold secondary to #2. 6. bilateral lower extremity edema-- questionable etiology may be secondary to calcium channel blocker. Improved no edema today. LFTs are within normal limits. Urinalysis is negative for proteinuria. No signs or symptoms of heart failure. Norvasc on hold. Follow. 7. prophylaxis-PPI for GI, SCDs for DVT.    LOS: 3 days   [facility] 08/04/2011, 3:14 PM

## 2011-08-04 NOTE — Progress Notes (Signed)
Occupational Therapy Evaluation Patient Details Name: Bonnie Morton MRN: [id number] DOB: June 18, 1928 Today's Date: 08/04/2011 EV2  [id number] Problem List:  Patient Active Problem List  Diagnoses  . ESSENTIAL HYPERTENSION  . CORONARY ATHEROSCLEROSIS NATIVE CORONARY ARTERY  . Hyperlipidemia  . Anemia  . Hyponatremia  . Constipation  . Hypokalemia  . Ulcer, peptic, acute or chronic    Past Medical History:  Past Medical History  Diagnosis Date  . CAD (coronary artery disease)     Pt. develpoed chest burning w/raiation to her L arm /exertion in 08. She went to ER, found to have NSTEMI, LHC showed long 75-80% proximal to mid LAD stenosis w/99% ostial D2 stenosis and diffuse mild to moderate RCA disease. She had 2 Cypher stents to the proximal to mid LAD. Echo (5/11): EF 60-65%, grade 1 diastolic dysfunction, normal wall motion, no significant valvular abnormalities.   . S/P subtotal thyroidectomy   . HTN (hypertension)   . Hyperlipidemia   . Iron deficiency anemia     with normal colonoscopy last year per her report.   . Osteoarthritis of hip   . H/O: hysterectomy   . Glaucoma    Past Surgical History:  Past Surgical History  Procedure Date  . Eye surgery     R eye cataract surgery  . Thyroidectomy     subtotal  . Abdominal hysterectomy     OT Assessment/Plan/Recommendation OT Assessment Clinical Impression Statement: Pt admitted for chronic constipation and abdominal pain who now is very close to baseline with ADLS.  Pt has sister at home to supervise initally and is not in need of further acute OT services. OT Recommendation/Assessment: Patient does not need any further OT services OT Recommendation Equipment Recommended: None recommended by PT OT Goals    OT Evaluation Precautions/Restrictions  Precautions Required Braces or Orthoses: No Restrictions Weight Bearing Restrictions: No Prior Functioning Home Living Lives With: Other (Comment) (sister.  Another  married sister lives next door.) Type of Home: House Home Layout: One level Home Access: Stairs to enter Entrance Stairs-Rails: Right Entrance Stairs-Number of Steps: 3 Bathroom Shower/Tub: Forensic scientist: Standard Bathroom Accessibility: Yes How Accessible: Accessible via walker Home Adaptive Equipment: None Prior Function Level of Independence: Independent with gait;Independent with basic ADLs;Independent with homemaking with ambulation Driving: No Leisure: Hobbies-yes (Comment) Comments: works in yard ADL ADL Eating/Feeding: Simulated;Independent Where Assessed - Eating/Feeding: Edge of bed Grooming: Performed;Independent Where Assessed - Grooming: Standing at sink Upper Body Bathing: Set up;Performed Where Assessed - Upper Body Bathing: Sitting, bed Lower Body Bathing: Performed;Set up Where Assessed - Lower Body Bathing: Sit to stand from bed Upper Body Dressing: Performed;Set up Where Assessed - Upper Body Dressing: Standing Lower Body Dressing: Performed;Set up Where Assessed - Lower Body Dressing: Sit to stand from bed Toilet Transfer: Performed;Supervision/safety Toilet Transfer Method: Proofreader: Regular height toilet;Grab bars Toileting - Clothing Manipulation: Performed;Independent Where Assessed - Toileting Clothing Manipulation: Standing Toileting - Hygiene: Performed;Independent Where Assessed - Toileting Hygiene: Sit on 3-in-1 or toilet Tub/Shower Transfer: Not assessed ADL Comments: Pt overall remains very independent with adls. Vision/Perception    Cognition Cognition Arousal/Alertness: Awake/alert Overall Cognitive Status: Appears within functional limits for tasks assessed Orientation Level: Oriented X4 Sensation/Coordination Coordination Gross Motor Movements are Fluid and Coordinated: Yes Fine Motor Movements are Fluid and Coordinated: Yes Extremity Assessment RUE Assessment RUE Assessment:  Within Functional Limits LUE Assessment LUE Assessment: Within Functional Limits Mobility  Bed Mobility Bed Mobility: Yes Supine to Sit: 6: Modified independent (  Device/Increase time) (used bedrails) Transfers Transfers: Yes Sit to Stand: 5: Supervision Stand to Sit: 6: Modified independent (Device/Increase time) Exercises   End of Session OT - End of Session Equipment Utilized During Treatment: Gait belt Activity Tolerance: Patient tolerated treatment well Patient left: in bed (pt sponge bathing as therapist leaving) Nurse Communication: Mobility status for transfers General Behavior During Session: [facility] for tasks performed Cognition: [facility] for tasks performed   Hope Budds  [phone] 08/04/2011, 10:03 AM

## 2011-08-04 NOTE — Progress Notes (Signed)
Subjective: The patient seems to be doing well at the present time. She is waiting on her dinner and is eager to go home. She denies having any nausea, vomiting or abdominal pain. She suffers from chronic constipation and uses 1 stool softener and a [employer] brand probiotic on a daily basis, which she claims does not help much. She can go 3-4 days with a BM. She has a fairly good appetite but has lost about 1.5 lbs in the last few days. Objective: Vital signs in last 24 hours: Temp:  [98.1 F (36.7 C)-98.6 F (37 C)] 98.6 F (37 C) (11/26 1346) Pulse Rate:  [57-81] 61  (11/26 1346) Resp:  [18-20] 18  (11/26 1346) BP: (110-135)/(54-85) 125/58 mmHg (11/26 1346) SpO2:  [96 %-100 %] 100 % (11/26 1346) Weight:  [67.5 kg (148 lb 13 oz)] 148 lb 13 oz (67.5 kg) (11/26 0458) Last BM Date: 08/03/11 Intake/Output from previous day: 11/25 0701 - 11/26 0700 In: 560 [P.O.:360; I.V.:200] Out: -  Intake/Output this shift: Total I/O In: 600 [P.O.:600] Out: -  General appearance: alert, cooperative, appears stated age and no distress Resp: clear to auscultation bilaterally and normal percussion bilaterally Cardio: regular rate and rhythm, S1, S2 normal, no murmur, click, rub or gallop GI: soft, non-tender; bowel sounds normal; no masses,  no organomegaly Lab Results: [facility] 08/04/11 0450 08/03/11 0543 08/02/11 1830  WBC 8.0 7.7 7.7  HGB 9.9* 9.4* 10.1*  HCT 32.6* 29.6* 30.8*  PLT 415* 421* 430*  BMET Basename 08/04/11 0450 08/03/11 0543 08/02/11 0320  NA 134* 133* 128*  K 4.0 4.0 3.3*  CL 101 99 93*  CO2 24 25 27   GLUCOSE 80 80 87  BUN 25* 30* 40*  CREATININE 0.96 1.08 1.22*  CALCIUM 9.7 8.9 8.4  LFT Basename 08/02/11 0320  PROT 6.3  ALBUMIN 3.0*  AST 16  ALT 9  ALKPHOS 69  BILITOT 0.8  BILIDIR --  IBILI --  PT/INR Basename 08/01/11 1922  LABPROT 14.6  INR 1.12  Medications: I have reviewed the patient's current medications. Assessment/Plan:  Anemia with GI bleed secondary  to gastric ulcers: Agree with PPI's for now; avoid all NSAIDS. Patient has been advised to follow up with me in 7-10 days.  Chronic constipation: Patient has been advised to take Colace 100 mg 2 PO BID along with Miralax 17 gms PO BID.   CAD  DNR   LOS: 3 days   Jadasia Haws 08/04/2011, 5:16 PM

## 2011-08-04 NOTE — Progress Notes (Signed)
Physical Therapy Evaluation Patient Details Name: Bonnie Morton MRN: [id number] DOB: Jan 13, 1928 Today's Date: 08/04/2011 Time: 4098-1191   Eval II Problem List:  Patient Active Problem List  Diagnoses  . ESSENTIAL HYPERTENSION  . CORONARY ATHEROSCLEROSIS NATIVE CORONARY ARTERY  . Hyperlipidemia  . Anemia  . Hyponatremia  . Constipation  . Hypokalemia  . Ulcer, peptic, acute or chronic    Past Medical History:  Past Medical History  Diagnosis Date  . CAD (coronary artery disease)     Pt. develpoed chest burning w/raiation to her L arm /exertion in 08. She went to ER, found to have NSTEMI, LHC showed long 75-80% proximal to mid LAD stenosis w/99% ostial D2 stenosis and diffuse mild to moderate RCA disease. She had 2 Cypher stents to the proximal to mid LAD. Echo (5/11): EF 60-65%, grade 1 diastolic dysfunction, normal wall motion, no significant valvular abnormalities.   . S/P subtotal thyroidectomy   . HTN (hypertension)   . Hyperlipidemia   . Iron deficiency anemia     with normal colonoscopy last year per her report.   . Osteoarthritis of hip   . H/O: hysterectomy   . Glaucoma    Past Surgical History:  Past Surgical History  Procedure Date  . Eye surgery     R eye cataract surgery  . Thyroidectomy     subtotal  . Abdominal hysterectomy     PT Assessment/Plan/Recommendation PT Assessment Clinical Impression Statement: Pt presents with diagnosisi of anemia, general weakness. Pt would benefit from skilled PT in the acute care setting to improve general strength and activity tolerance in order to maximize independence with basic functional mobility in preperation for D/C home. PT Recommendation/Assessment: Patient will need skilled PT in the acute care venue PT Problem List: Decreased strength;Decreased activity tolerance PT Therapy Diagnosis : Generalized weakness PT Plan PT Frequency: Min 3X/week PT Treatment/Interventions: Therapeutic exercise;Functional mobility  training;Stair training;Patient/family education PT Recommendation Recommendations for Other Services: Other (comment) (OT already on board) Follow Up Recommendations: home safety evaluation Equipment Recommended: None recommended by PT PT Goals  Acute Rehab PT Goals PT Goal Formulation: With patient Time For Goal Achievement: 7 days Pt will Ambulate: 51 - 150 feet;with modified independence Pt will Go Up / Down Stairs: 3-5 stairs;with rail(s);with modified independence  PT Evaluation Precautions/Restrictions  Precautions Required Braces or Orthoses: No Restrictions Weight Bearing Restrictions: No Prior Functioning  Home Living Lives With: Other (Comment) (sister.  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 Ambulation/Gait Ambulation/Gait: Yes Ambulation/Gait Assistance: 5: Supervision Ambulation/Gait Assistance Details (indicate cue type and reason): 1 instance of instability but pt did not lose balance. No assist required  to correct. Slow gait speed. Ambulation Distance (Feet): 140 Feet Assistive device: None Gait Pattern: Decreased step length - left;Decreased step length - right;Right foot flat;Left  foot flat  Posture/Postural Control Posture/Postural Control: No significant limitations Balance Balance Assessed: Yes Static Standing Balance Static Standing - Comment/# of Minutes: Static standing  with eyes open/closed without LOB. Tandem stance without LOB/difficulty. Dynamic Standing Balance Dynamic Standing - Comments: Pt able to p/u object and perform 360 degree turns without LOB. Pt unable to perform step task without UE support. Exercise    End of Session PT - End of Session Equipment Utilized During Treatment: Gait belt Activity Tolerance: Patient tolerated treatment well Patient left: with call bell in reach;Other (comment) (with OT at end of session) General Behavior During Session: [facility] for tasks performed Cognition: [facility] for tasks performed  Rebeca Alert [facility] 08/04/2011, 10:23 AM

## 2011-08-05 ENCOUNTER — Encounter (HOSPITAL_COMMUNITY): Payer: Self-pay | Admitting: Internal Medicine

## 2011-08-05 ENCOUNTER — Encounter: Payer: Self-pay | Admitting: Internal Medicine

## 2011-08-05 DIAGNOSIS — R600 Localized edema: Secondary | ICD-10-CM | POA: Diagnosis present

## 2011-08-05 LAB — CBC
HCT: 31.8 % — ABNORMAL LOW (ref 36.0–46.0)
Hemoglobin: 9.8 g/dL — ABNORMAL LOW (ref 12.0–15.0)
MCV: 75.2 fL — ABNORMAL LOW (ref 78.0–100.0)
RBC: 4.23 MIL/uL (ref 3.87–5.11)
WBC: 8.1 10*3/uL (ref 4.0–10.5)

## 2011-08-05 MED ORDER — OLMESARTAN MEDOXOMIL 40 MG PO TABS: 40.0000 mg | ORAL_TABLET | Freq: Every day | ORAL | Status: DC

## 2011-08-05 MED ORDER — DOCUSATE SODIUM 100 MG PO CAPS: ORAL_CAPSULE | ORAL | Status: DC

## 2011-08-05 MED ORDER — CLOPIDOGREL BISULFATE 75 MG PO TABS: 75.0000 mg | ORAL_TABLET | Freq: Every day | ORAL | Status: DC

## 2011-08-05 MED ORDER — POLYETHYLENE GLYCOL 3350 17 G PO PACK: 17.0000 g | PACK | Freq: Two times a day (BID) | ORAL | Status: AC

## 2011-08-05 MED ORDER — SUCRALFATE 1 G PO TABS: 1.0000 g | ORAL_TABLET | Freq: Three times a day (TID) | ORAL | Status: DC

## 2011-08-05 MED ORDER — PANTOPRAZOLE SODIUM 40 MG PO TBEC: 40.0000 mg | DELAYED_RELEASE_TABLET | Freq: Every day | ORAL | Status: DC

## 2011-08-05 NOTE — Progress Notes (Signed)
CARE MANAGEMENT NOTE 08/05/2011  Patient:  Bonnie Morton,Bonnie Morton   Account Number:  [id number]  Date Initiated:  08/05/2011  Documentation initiated by:  DAVIS,RHONDA  Subjective/Objective Assessment:   pt admitted with hgb of 5.0, edg showed bleeding ulcers     Action/Plan:   lives at home   Anticipated DC Date:  08/08/2011   Anticipated DC Plan:  HOME/SELF CARE  In-house referral  NA      DC Planning Services  NA      [facility] Choice  NA   Choice offered to / List presented to:  NA   DME arranged  NA      DME agency  NA     HH arranged  NA      Status of service:  In process, will continue to follow Medicare Important Message given?   (If response is "NO", the following Medicare IM given date fields will be blank) Date Medicare IM given:   Date Additional Medicare IM given:    Discharge Disposition:    Per UR Regulation:  Reviewed for med. necessity/level of care/duration of stay  Comments:  [id number]/Rhonda Earlene Plater, RN, BSN, CCM/CHART REVIEW FOR UR PERFORMED.

## 2011-08-05 NOTE — Progress Notes (Signed)
Physical Therapy Treatment Patient Details Name: Bonnie Morton MRN: [id number] DOB: July 15, 1928 Today's Date: 08/05/2011 Time: 4098-1191   1G PT Assessment/Plan  PT - Assessment/Plan Comments on Treatment Session: Pt reports feeling much better this session - denies weakness. Possible D/C home soon or today. PT Plan: All goals met and education completed, patient dischaged from PT services;Discharge plan needs to be updated Follow Up Recommendations: None;Other (comment) (Home safety evaluation no longer needed. ) Equipment Recommended: None recommended by PT PT Goals  Acute Rehab PT Goals PT Goal: Ambulate - Progress: Met PT Goal: Up/Down Stairs - Progress: Met  PT Treatment Precautions/Restrictions  Precautions Required Braces or Orthoses: No Restrictions Weight Bearing Restrictions: No Mobility (including Balance) Bed Mobility Bed Mobility: Yes Supine to Sit: 6: Modified independent (Device/Increase time) Sit to Supine - Left: 6: Modified independent (Device/Increase time) Transfers Transfers: Yes Sit to Stand: 6: Modified independent (Device/Increase time) Stand to Sit: 6: Modified independent (Device/Increase time) Ambulation/Gait Ambulation/Gait: Yes Ambulation/Gait Assistance: 6: Modified independent (Device/Increase time) Ambulation/Gait Assistance Details (indicate cue type and reason): Slow steady gait speed. Ambulation Distance (Feet): 250 Feet Assistive device: None Gait Pattern: Decreased step length - left;Decreased step length - right;Right foot flat;Left foot flat Stairs: Yes Stairs Assistance: 6: Modified independent (Device/Increase time) Stair Management Technique: One rail Right;Forwards;Alternating pattern;Step to pattern (alternating up. step-to pattern down) Number of Stairs: 4     Exercise    End of Session PT - End of Session Equipment Utilized During Treatment: Gait belt Activity Tolerance: Patient tolerated treatment well Patient left: in  bed;with call bell in reach General Behavior During Session: [facility] for tasks performed Cognition: [facility] for tasks performed  Rebeca Alert [facility] 08/05/2011, 11:09 AM

## 2011-08-05 NOTE — Progress Notes (Signed)
TALKED TO PATIENT, UP WALKING THE HALLWAY WITH PHYSICAL THERAPIST WITH LITTLE ASSISTANCE; NO HHPT NEEDED PER PATIENT, HAS FAMILY MEMBERS THAT LIVE CLOSE BY AND LIVES WITH HER SISTER; B. Baya Lentz RN, BSN, MHA.

## 2011-08-05 NOTE — Discharge Summary (Signed)
Discharge Summary  Bonnie Morton MR#: [id number]  DOB:[birth year]  Date of Admission: 08/01/2011 Date of Discharge: 08/05/2011  Patient's PCP: Alva Garnet., MD  Attending Physician:THOMPSON,DANIEL  Consults:  Gastroenterology-Dr. Lina Sar on 08/02/2011  Discharge Diagnoses: Anemia Present on Admission:  .Anemia .Hyponatremia .Constipation .ESSENTIAL HYPERTENSION .Ulcer, peptic, acute or chronic .Edema of lower extremity   Brief Admitting History and Physical  Bonnie Morton is a pleasant 77 year old African American female, retired Engineer, civil (consulting), with a history of chronic constipation, history of coronary artery disease status post Cypher stent x2 to the proximal and mid LAD in March 2008, history of hypertension history of iron deficiency anemia with normal colonoscopy per patient in 2010, history of subtotal thyroidectomy who presented to the ED with worsening low abdominal pain and constipation. Patient states that she does have a chronic constipation which is usually relieved with over-the-counter laxatives. Patient states that over the past 2-3 weeks she's been having problems with constipation, with some loose pasty stools. Patient denies any hematemesis no hematochezia no melena. Patient states that usually while straining she usually develops a burning sensation in head left substernal region which is non-radiating intermittent in nature and lasts only a few minutes with some associated belching. Patient denies any shortness of breath no palpitations no radiation. Patient states during these times she usually takes an extra aspirin and extra dose of Plavix that she's taking before in the past. Patient states that the night prior to admission she was felt severely constipated, with lower abdominal pain, and a pressure in her rectum. Patient states that she's having some loose stools around this pressure but has not had a regular bowel movement. Patient presented to the ED x-rays  which were done with consistent with a constipation patient was disimpacted. ED PA with improvement in abdominal pain and distention. Patient did also undergo some abdominal distention. And some bilateral lower extremity swelling that has been ongoing part of the past several weeks. Patient denies any fever no chills no nausea no vomiting no dysuria no cough no use of Goody's powders. She does endorse a mild generalized weakness. In the ED CBC which was done did show a hemoglobin of 5.5, Hemoccult per ED PA was positive. Will call to admit the patient for further evaluation and management. For the rest of the admission history and physical please see H&P dictated per Dr. Janee Morn.  Discharge Medications Current Discharge Medication List    START taking these medications   Details  olmesartan (BENICAR) 40 MG tablet Take 1 tablet (40 mg total) by mouth daily. Qty: 31 tablet, Refills: 0    pantoprazole (PROTONIX) 40 MG tablet Take 1 tablet (40 mg total) by mouth daily at 6 (six) AM. Qty: 31 tablet, Refills: 0    polyethylene glycol (MIRALAX / GLYCOLAX) packet Take 17 g by mouth 2 (two) times daily. Qty: 30 each, Refills: 0    sucralfate (CARAFATE) 1 G tablet Take 1 tablet (1 g total) by mouth 4 (four) times daily -  with meals and at bedtime. Qty: 90 tablet, Refills: 0      CONTINUE these medications which have CHANGED   Details  clopidogrel (PLAVIX) 75 MG tablet Take 1 tablet (75 mg total) by mouth daily.    docusate sodium (COLACE) 100 MG capsule 200mg  2 times daily Qty: 90 capsule, Refills: 0      CONTINUE these medications which have NOT CHANGED   Details  aspirin 81 MG tablet Take 81 mg by mouth daily.  Calcium 600-200 MG-UNIT per tablet Take 1 tablet by mouth 2 (two) times daily.     latanoprost (XALATAN) 0.005 % ophthalmic solution Place 1 drop into both eyes Daily.    metoprolol tartrate (LOPRESSOR) 25 MG tablet Take 12.5 mg by mouth 2 (two) times daily.     Omega-3  Fatty Acids (FISH OIL) 1000 MG CAPS Take 1 capsule by mouth daily.     rosuvastatin (CRESTOR) 5 MG tablet Take 5 mg by mouth daily.     timolol (TIMOPTIC) 0.5 % ophthalmic solution Place 1 drop into both eyes 2 (two) times daily.       STOP taking these medications     amLODipine (NORVASC) 10 MG tablet      olmesartan-hydrochlorothiazide (BENICAR HCT) 40-25 MG per tablet         Hospital Course:  #1 Anemia The patient was admitted with a hemoglobin of 5.5. Patient did have a history of iron deficiency anemia. Patient denied any overt gross GI bleed. FOBT which was done in the ED was positive. An anemia panel was obtained patient was admitted to the telemetry floor and patient was transfused 3 units of packed red blood cells. Patient did have some complaints of burning and belching and it was felt that patient likely did have a upper GI source for her anemia. Patient was placed on a proton pump inhibitor and a GI consultation was obtained. Patient was seen in consultation by Dr. Lina Sar of the [facility] on 08/02/2011. Her GI recommendations it was noted that patient had a recent colonoscopy per Dr. Loreta Ave and a such did not need that repeated. Patient was prepped for an EGD which was done on 08/03/2011 per Dr. Juanda Chance. EGD which was done did note 2 prepyloric and pyloric channel ulcers without bleeding with no obstruction that appeared benign. Biopsies were obtained and were pending at the time of discharge and will need to be followed up upon. Patient was maintained on a proton pump inhibitor and Carafate was added to her regimen. Patient improved clinically did not have any further belching or burning sensation in her abdomen. Patient's H&H remained stable. And patient will be discharged in stable and improved condition to followup with a gastroenterologist in 7-10 days. On followup CBC will need to be obtained to followup on patient's H&H and biopsy results will need to be followed up upon. #2  hyponatremia On admission patient was noted to have a mild hyponatremia. Patient's HCTZ was discontinued. Patient was placed on gentle hydration. With improvement in hyponatremia patient will followup with her PCP as outpatient. #3 constipation-on admission patient was noted to be constipated. Patient was manually disimpacted in the ED with symptomatic improvement. Patient was placed on MiraLAX and Colace for bowel regimen patient be discharged home on twice daily MiraLAX and Colace as well. She will follow up with GI in her PCP as outpatient. #4 bilateral lower extremity edema On admission patient was noted to have some bilateral lower extremity edema and was complaining about it. Labs which were obtained showed a normal renal function with no nephrotic range proteinuria, LFTs were within normal limits and patient did not exhibit any signs of heart failure. Patient's Norvasc was discontinued during the hospitalization with resolution of her lower extremity edema. Patient will be discharged home off of her Norvasc and will followup with her PCP patient. #5 hypertension- On admission patient was noted to be an on a combination of ARB and HCTZ, was also noted to be on  metoprolol and also noted to be on Norvasc. Due to patient's hyponatremia had HCTZ was discontinued. Deep to patient's lower extremity edema her Norvasc was discontinued. Patient was maintained on the hospitalization on an ARB and beta blocker with good blood pressure control. Patient will be discharged home on just the ARB and beta blocker and will followup with her PCP as outpatient patient's blood pressure will need to be reassessed and further management will be deferred to patient's PCP. The rest of patient's chronic medical issues remained stable throughout the hospitalization. It's been a pleasure taking care of Bonnie Morton.   Present on Admission:  .Anemia .Hyponatremia .Constipation .ESSENTIAL HYPERTENSION .Ulcer, peptic, acute or  chronic .Edema of lower extremity   Day of Discharge BP 114/69  Pulse 61  Temp(Src) 98.6 F (37 C) (Oral)  Resp 18  Ht 5' 3.5" (1.613 m)  Wt 63.277 kg (139 lb 8 oz)  BMI 24.32 kg/m2  SpO2 99% General: Alert, awake, oriented x3, in no acute distress. Patient denies any abdominal pain, no chest pain. HEENT: No bruits, no goiter. Heart: Regular rate and rhythm, without murmurs, rubs, gallops. Lungs: Clear to auscultation bilaterally. Abdomen: Soft, nontender, nondistended, positive bowel sounds. Extremities: No clubbing cyanosis or edema with positive pedal pulses. Neuro: Grossly intact, nonfocal.   Results for orders placed during the hospital encounter of 08/01/11 (from the past 48 hour(s))  BASIC METABOLIC PANEL     Status: Abnormal   Collection Time   08/04/11  4:50 AM      Component Value Range Comment   Sodium 134 (*) 135 - 145 (mEq/L)    Potassium 4.0  3.5 - 5.1 (mEq/L)    Chloride 101  96 - 112 (mEq/L)    CO2 24  19 - 32 (mEq/L)    Glucose, Bld 80  70 - 99 (mg/dL)    BUN 25 (*) 6 - 23 (mg/dL)    Creatinine, Ser 1.61  0.50 - 1.10 (mg/dL)    Calcium 9.7  8.4 - 10.5 (mg/dL)    GFR calc non Af Amer 54 (*) >90 (mL/min)    GFR calc Af Amer 63 (*) >90 (mL/min)   CBC     Status: Abnormal   Collection Time   08/04/11  4:50 AM      Component Value Range Comment   WBC 8.0  4.0 - 10.5 (K/uL)    RBC 4.30  3.87 - 5.11 (MIL/uL)    Hemoglobin 9.9 (*) 12.0 - 15.0 (g/dL)    HCT 09.6 (*) 04.5 - 46.0 (%)    MCV 75.8 (*) 78.0 - 100.0 (fL)    MCH 23.0 (*) 26.0 - 34.0 (pg)    MCHC 30.4  30.0 - 36.0 (g/dL)    RDW 40.9 (*) 81.1 - 15.5 (%)    Platelets 415 (*) 150 - 400 (K/uL)   CBC     Status: Abnormal   Collection Time   08/05/11  5:00 AM      Component Value Range Comment   WBC 8.1  4.0 - 10.5 (K/uL)    RBC 4.23  3.87 - 5.11 (MIL/uL)    Hemoglobin 9.8 (*) 12.0 - 15.0 (g/dL)    HCT 91.4 (*) 78.2 - 46.0 (%)    MCV 75.2 (*) 78.0 - 100.0 (fL)    MCH 23.2 (*) 26.0 - 34.0 (pg)     MCHC 30.8  30.0 - 36.0 (g/dL)    RDW 95.6 (*) 21.3 - 15.5 (%)    Platelets 470 (*)  150 - 400 (K/uL)     Dg Abd Acute W/chest  08/01/2011  *RADIOLOGY REPORT*  Clinical Data: Constipation and chest pain  ACUTE ABDOMEN SERIES (ABDOMEN 2 VIEW & CHEST 1 VIEW)  Comparison: None  Findings: The heart size appears normal.  There is no pleural effusion or pulmonary edema identified.  There is no airspace consolidation identified.  The bowel gas pattern appears non obstructed.  Moderate stool burden is identified throughout the colon and up to the rectum consistent with the clinical history of constipation.  IMPRESSION:  1.  No active cardiopulmonary abnormality. 2.  Moderate stool burden throughout the colon consistent with constipation.  Original Report Authenticated By: Rosealee Albee, M.D.     Disposition: Home  Diet: low sodium heart healthy diet  Activity: Increase activity slowly.   Follow-up Appts: Discharge Orders    Future Orders Please Complete By Expires   Diet - low sodium heart healthy      Increase activity slowly      Discharge instructions      Comments:   Follow up with Alva Garnet., MD in 1 week Follow up with Dr Loreta Ave in 7-10 days. No NSAIDS, hold plavix for 2 weeks and resume or per Dr Loreta Ave       TESTS THAT NEED FOLLOW-UP CBC, BMET.  Time spent on discharge, talking to the patient, and coordinating care: greater than 60 mins.   SignedRamiro Harvest 08/05/2011, 6:13 PM

## 2011-12-08 ENCOUNTER — Other Ambulatory Visit: Payer: Self-pay | Admitting: Internal Medicine

## 2011-12-08 DIAGNOSIS — Z1231 Encounter for screening mammogram for malignant neoplasm of breast: Secondary | ICD-10-CM

## 2012-01-13 ENCOUNTER — Ambulatory Visit
Admission: RE | Admit: 2012-01-13 | Discharge: 2012-01-13 | Disposition: A | Discharge status: Home / Self Care | Payer: Medicare Other | Source: Ambulatory Visit | Attending: Internal Medicine | Admitting: Internal Medicine

## 2012-01-13 DIAGNOSIS — Z1231 Encounter for screening mammogram for malignant neoplasm of breast: Secondary | ICD-10-CM

## 2013-05-11 ENCOUNTER — Encounter: Payer: Self-pay | Admitting: Internal Medicine

## 2013-05-16 ENCOUNTER — Encounter: Payer: Self-pay | Admitting: Internal Medicine

## 2013-05-17 ENCOUNTER — Encounter: Payer: Self-pay | Admitting: *Deleted

## 2013-07-27 ENCOUNTER — Ambulatory Visit: Payer: Medicare Other | Admitting: Internal Medicine

## 2014-02-04 ENCOUNTER — Encounter: Payer: Self-pay | Admitting: Internal Medicine

## 2014-10-19 DIAGNOSIS — H11153 Pinguecula, bilateral: Secondary | ICD-10-CM | POA: Diagnosis not present

## 2014-10-19 DIAGNOSIS — H4011X2 Primary open-angle glaucoma, moderate stage: Secondary | ICD-10-CM | POA: Diagnosis not present

## 2015-01-17 DIAGNOSIS — H4011X2 Primary open-angle glaucoma, moderate stage: Secondary | ICD-10-CM | POA: Diagnosis not present

## 2015-05-03 DIAGNOSIS — E784 Other hyperlipidemia: Secondary | ICD-10-CM | POA: Diagnosis not present

## 2015-05-03 DIAGNOSIS — I10 Essential (primary) hypertension: Secondary | ICD-10-CM | POA: Diagnosis not present

## 2015-05-03 DIAGNOSIS — E559 Vitamin D deficiency, unspecified: Secondary | ICD-10-CM | POA: Diagnosis not present

## 2015-05-03 DIAGNOSIS — E785 Hyperlipidemia, unspecified: Secondary | ICD-10-CM | POA: Diagnosis not present

## 2015-05-03 DIAGNOSIS — Z0000 Encounter for general adult medical examination without abnormal findings: Secondary | ICD-10-CM | POA: Diagnosis not present

## 2015-05-03 DIAGNOSIS — I119 Hypertensive heart disease without heart failure: Secondary | ICD-10-CM | POA: Diagnosis not present

## 2015-05-03 DIAGNOSIS — M1990 Unspecified osteoarthritis, unspecified site: Secondary | ICD-10-CM | POA: Diagnosis not present

## 2015-05-03 DIAGNOSIS — Z1389 Encounter for screening for other disorder: Secondary | ICD-10-CM | POA: Diagnosis not present

## 2015-05-03 DIAGNOSIS — R7309 Other abnormal glucose: Secondary | ICD-10-CM | POA: Diagnosis not present

## 2015-08-24 DIAGNOSIS — H2512 Age-related nuclear cataract, left eye: Secondary | ICD-10-CM | POA: Diagnosis not present

## 2015-08-24 DIAGNOSIS — H47233 Glaucomatous optic atrophy, bilateral: Secondary | ICD-10-CM | POA: Diagnosis not present

## 2015-08-24 DIAGNOSIS — H18413 Arcus senilis, bilateral: Secondary | ICD-10-CM | POA: Diagnosis not present

## 2015-08-24 DIAGNOSIS — H11153 Pinguecula, bilateral: Secondary | ICD-10-CM | POA: Diagnosis not present

## 2015-08-24 DIAGNOSIS — H401133 Primary open-angle glaucoma, bilateral, severe stage: Secondary | ICD-10-CM | POA: Diagnosis not present

## 2015-08-29 DIAGNOSIS — Z6839 Body mass index (BMI) 39.0-39.9, adult: Secondary | ICD-10-CM | POA: Diagnosis not present

## 2015-08-29 DIAGNOSIS — M25561 Pain in right knee: Secondary | ICD-10-CM | POA: Diagnosis not present

## 2015-08-29 DIAGNOSIS — I119 Hypertensive heart disease without heart failure: Secondary | ICD-10-CM | POA: Diagnosis not present

## 2015-08-29 DIAGNOSIS — E785 Hyperlipidemia, unspecified: Secondary | ICD-10-CM | POA: Diagnosis not present

## 2017-12-25 ENCOUNTER — Encounter (HOSPITAL_COMMUNITY)
Admission: EM | Disposition: A | Discharge status: Home / Self Care | Payer: Self-pay | Source: Home / Self Care | Attending: Internal Medicine

## 2017-12-25 ENCOUNTER — Encounter (HOSPITAL_COMMUNITY): Payer: Self-pay | Admitting: Emergency Medicine

## 2017-12-25 ENCOUNTER — Other Ambulatory Visit: Payer: Self-pay

## 2017-12-25 ENCOUNTER — Emergency Department (HOSPITAL_COMMUNITY): Payer: Medicare Other

## 2017-12-25 ENCOUNTER — Inpatient Hospital Stay (HOSPITAL_COMMUNITY)
Admission: EM | Admit: 2017-12-25 | Discharge: 2017-12-27 | DRG: 281 | Disposition: A | Discharge status: Home / Self Care | Payer: Medicare Other | Attending: Internal Medicine | Admitting: Internal Medicine

## 2017-12-25 DIAGNOSIS — Z955 Presence of coronary angioplasty implant and graft: Secondary | ICD-10-CM

## 2017-12-25 DIAGNOSIS — Z66 Do not resuscitate: Secondary | ICD-10-CM | POA: Diagnosis present

## 2017-12-25 DIAGNOSIS — R079 Chest pain, unspecified: Secondary | ICD-10-CM | POA: Diagnosis not present

## 2017-12-25 DIAGNOSIS — E785 Hyperlipidemia, unspecified: Secondary | ICD-10-CM | POA: Diagnosis present

## 2017-12-25 DIAGNOSIS — I2510 Atherosclerotic heart disease of native coronary artery without angina pectoris: Secondary | ICD-10-CM

## 2017-12-25 DIAGNOSIS — Z7982 Long term (current) use of aspirin: Secondary | ICD-10-CM

## 2017-12-25 DIAGNOSIS — I214 Non-ST elevation (NSTEMI) myocardial infarction: Secondary | ICD-10-CM | POA: Diagnosis not present

## 2017-12-25 DIAGNOSIS — I25119 Atherosclerotic heart disease of native coronary artery with unspecified angina pectoris: Secondary | ICD-10-CM | POA: Diagnosis not present

## 2017-12-25 DIAGNOSIS — I160 Hypertensive urgency: Secondary | ICD-10-CM

## 2017-12-25 DIAGNOSIS — I452 Bifascicular block: Secondary | ICD-10-CM | POA: Diagnosis present

## 2017-12-25 DIAGNOSIS — I491 Atrial premature depolarization: Secondary | ICD-10-CM | POA: Diagnosis present

## 2017-12-25 DIAGNOSIS — R001 Bradycardia, unspecified: Secondary | ICD-10-CM | POA: Diagnosis present

## 2017-12-25 DIAGNOSIS — I252 Old myocardial infarction: Secondary | ICD-10-CM

## 2017-12-25 DIAGNOSIS — I10 Essential (primary) hypertension: Secondary | ICD-10-CM | POA: Diagnosis not present

## 2017-12-25 DIAGNOSIS — Z79899 Other long term (current) drug therapy: Secondary | ICD-10-CM

## 2017-12-25 DIAGNOSIS — H409 Unspecified glaucoma: Secondary | ICD-10-CM | POA: Diagnosis present

## 2017-12-25 DIAGNOSIS — E782 Mixed hyperlipidemia: Secondary | ICD-10-CM | POA: Diagnosis present

## 2017-12-25 DIAGNOSIS — Z7902 Long term (current) use of antithrombotics/antiplatelets: Secondary | ICD-10-CM

## 2017-12-25 HISTORY — PX: LEFT HEART CATH AND CORONARY ANGIOGRAPHY: CATH118249

## 2017-12-25 LAB — BASIC METABOLIC PANEL
Anion gap: 12 (ref 5–15)
BUN: 32 mg/dL — ABNORMAL HIGH (ref 6–20)
CHLORIDE: 105 mmol/L (ref 101–111)
CO2: 23 mmol/L (ref 22–32)
Calcium: 11 mg/dL — ABNORMAL HIGH (ref 8.9–10.3)
Creatinine, Ser: 0.94 mg/dL (ref 0.44–1.00)
GFR calc Af Amer: >60 mL/min (ref >60)
GFR calc non Af Amer: 53 mL/min — ABNORMAL LOW (ref >60)
Glucose, Bld: 125 mg/dL — ABNORMAL HIGH (ref 65–99)
POTASSIUM: 3.5 mmol/L (ref 3.5–5.1)
SODIUM: 140 mmol/L (ref 135–145)

## 2017-12-25 LAB — CBC
HEMATOCRIT: 37.1 % (ref 36.0–46.0)
Hemoglobin: 12.3 g/dL (ref 12.0–15.0)
MCH: 32.5 pg (ref 26.0–34.0)
MCHC: 33.2 g/dL (ref 30.0–36.0)
MCV: 97.9 fL (ref 78.0–100.0)
Platelets: 256 10*3/uL (ref 150–400)
RBC: 3.79 MIL/uL — AB (ref 3.87–5.11)
RDW: 13.3 % (ref 11.5–15.5)
WBC: 5.7 10*3/uL (ref 4.0–10.5)

## 2017-12-25 LAB — I-STAT TROPONIN, ED: Troponin i, poc: 0.07 ng/mL (ref 0.00–0.08)

## 2017-12-25 LAB — TROPONIN I
TROPONIN I: 0.29 ng/mL — AB (ref <0.03)
TROPONIN I: 1.23 ng/mL — AB (ref <0.03)

## 2017-12-25 IMAGING — DX DG CHEST 2V
2 series · 2 of 2 positions shown · non-contrast
Comparison: No recent.

CLINICAL DATA: Shortness of breath.  Chest pain.

EXAM:
CHEST - 2 VIEW

[w chest lat]
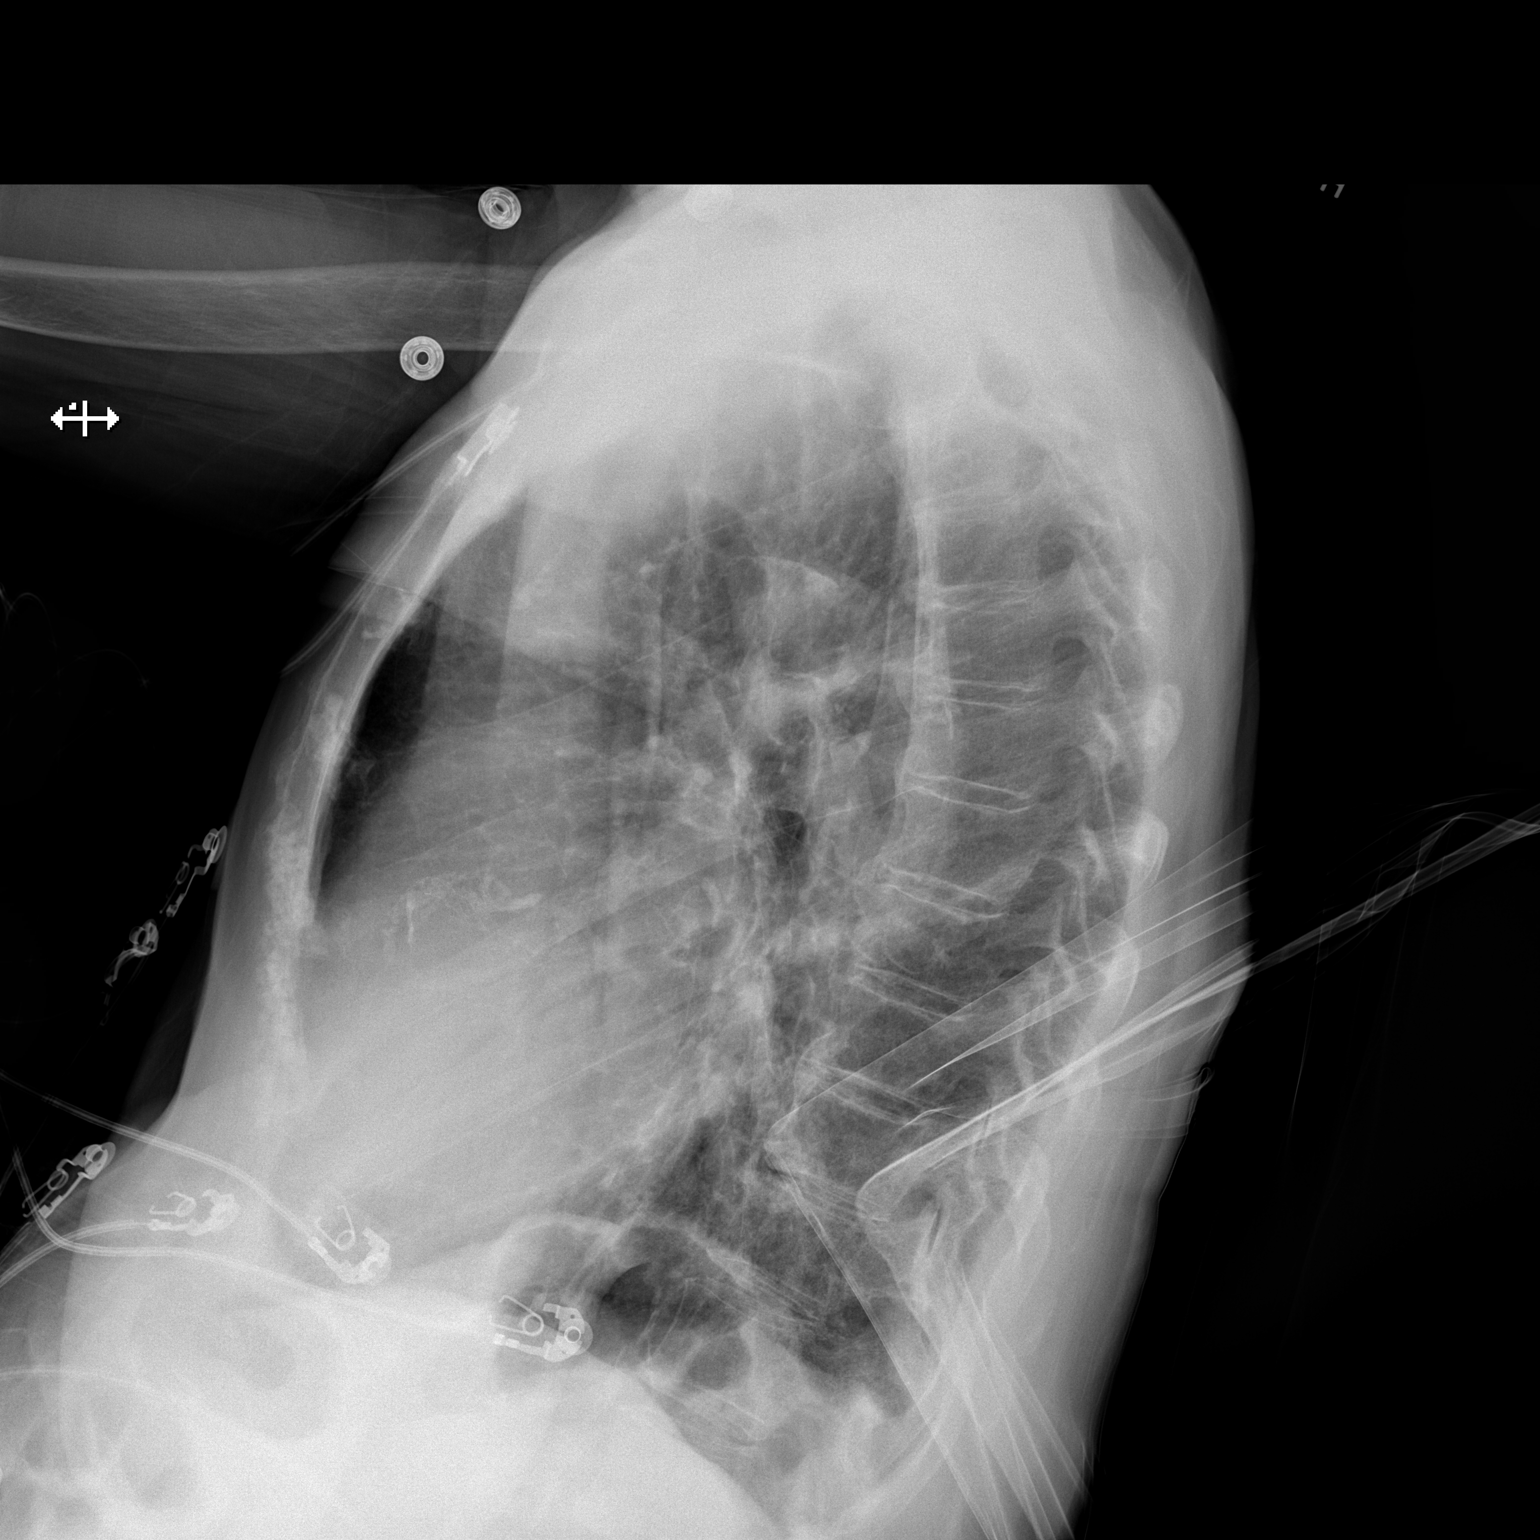

[w chest pa]
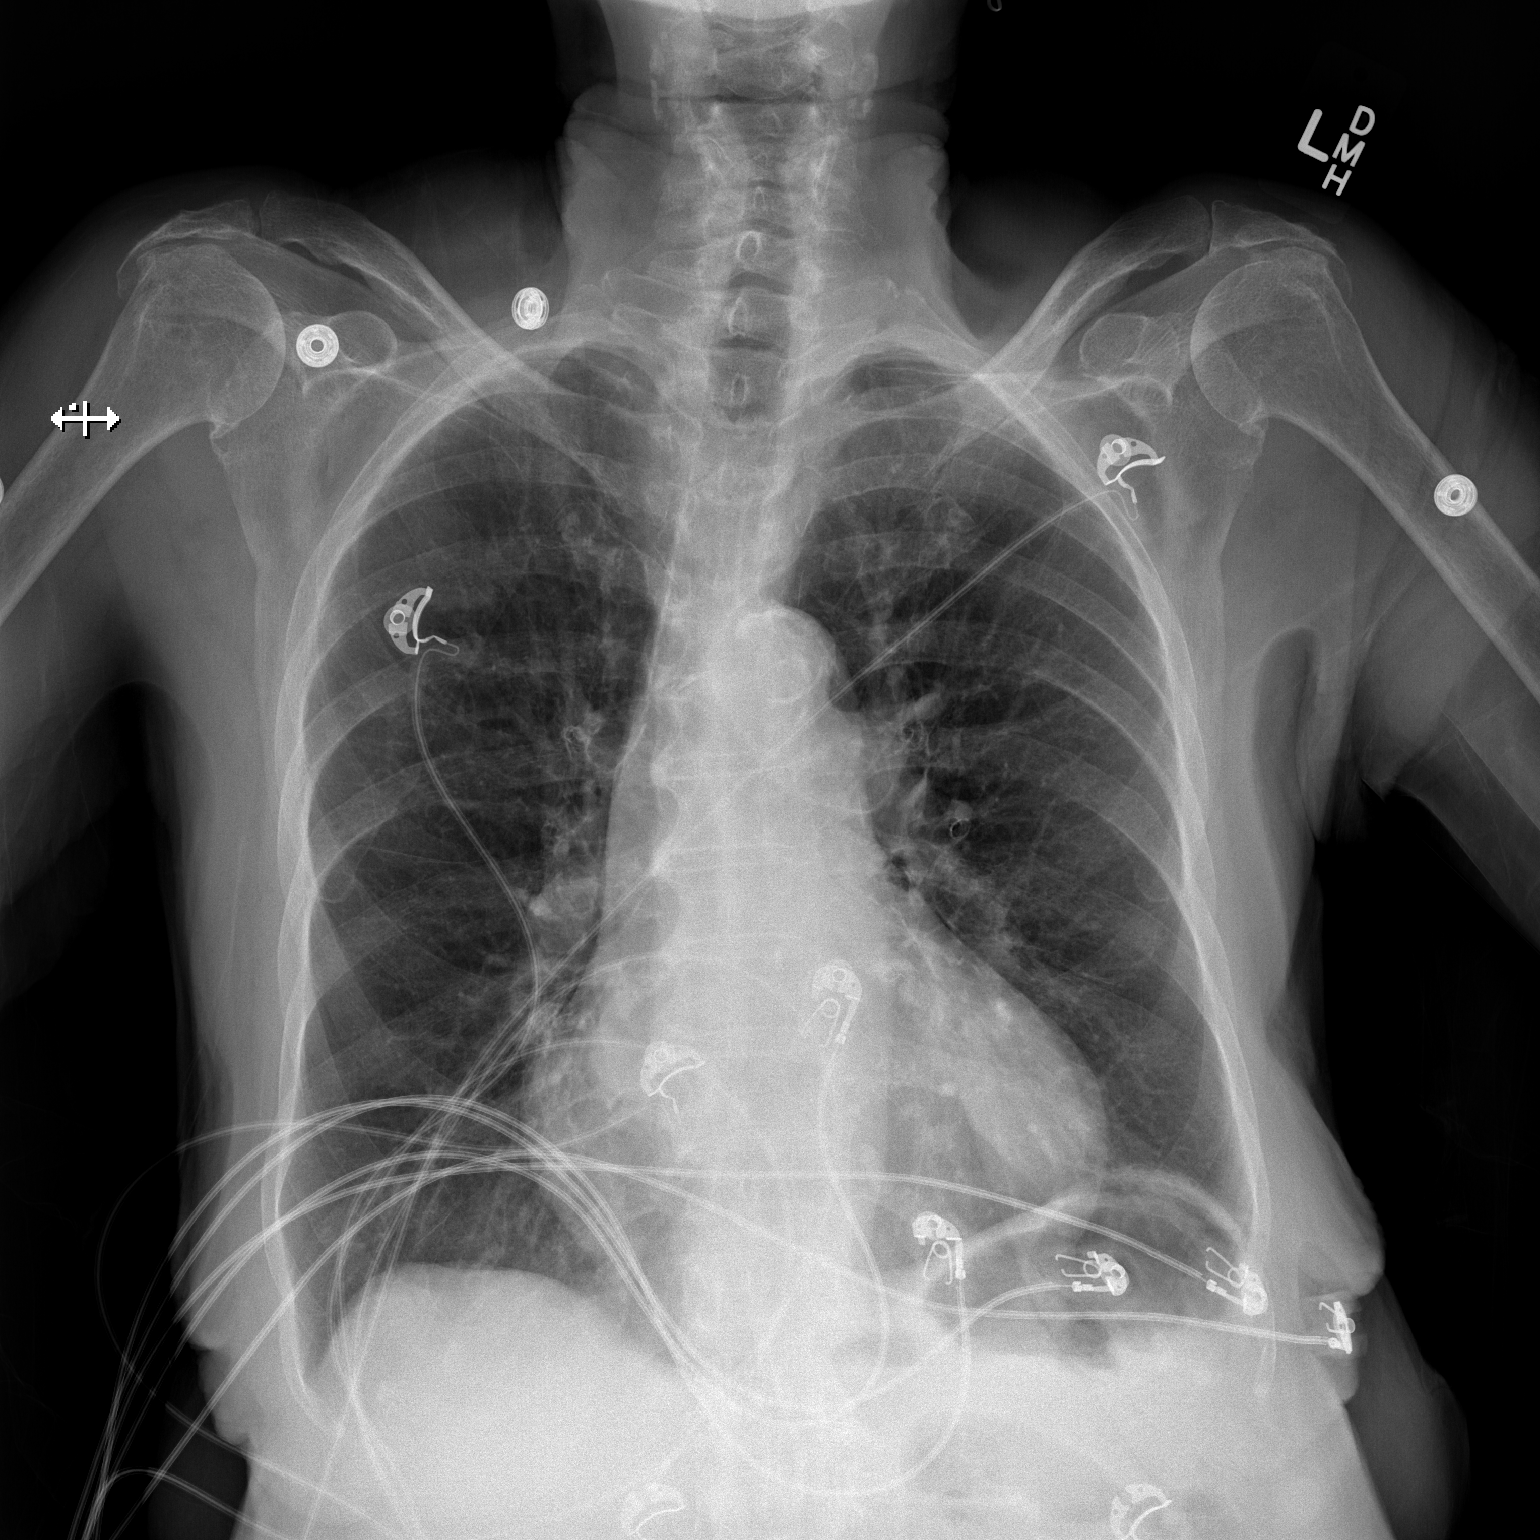

[2 of 2 positions shown; findings below may reference images not displayed]

FINDINGS: Mediastinum and hilar structures normal. Cardiomegaly with normal
pulmonary vascularity. No focal infiltrate. No pleural effusion or
pneumothorax. Degenerative changes thoracic spine. Thoracic spine
scoliosis.
IMPRESSION: No acute cardiopulmonary disease.

## 2017-12-25 SURGERY — LEFT HEART CATH AND CORONARY ANGIOGRAPHY
Anesthesia: LOCAL

## 2017-12-25 MED ORDER — PANTOPRAZOLE SODIUM 40 MG PO TBEC
40.0000 mg | DELAYED_RELEASE_TABLET | Freq: Every day | ORAL | Status: DC
  Administered 2017-12-26 – 2017-12-27 (×2): 40 mg via ORAL
  Filled 2017-12-25 (×2): qty 1

## 2017-12-25 MED ORDER — ACETAMINOPHEN 325 MG PO TABS: 650.0000 mg | ORAL_TABLET | ORAL | Status: DC | PRN

## 2017-12-25 MED ORDER — SODIUM CHLORIDE 0.9% FLUSH
3.0000 mL | Freq: Two times a day (BID) | INTRAVENOUS | Status: DC
  Administered 2017-12-26 – 2017-12-27 (×2): 3 mL via INTRAVENOUS

## 2017-12-25 MED ORDER — BRIMONIDINE TARTRATE 0.2 % OP SOLN
1.0000 [drp] | Freq: Three times a day (TID) | OPHTHALMIC | Status: DC
  Administered 2017-12-25 – 2017-12-27 (×6): 1 [drp] via OPHTHALMIC
  Filled 2017-12-25: qty 5

## 2017-12-25 MED ORDER — ONDANSETRON HCL 4 MG/2ML IJ SOLN: 4.0000 mg | Freq: Four times a day (QID) | INTRAMUSCULAR | Status: DC | PRN

## 2017-12-25 MED ORDER — VERAPAMIL HCL 2.5 MG/ML IV SOLN
INTRAVENOUS | Status: AC
  Filled 2017-12-25: qty 2

## 2017-12-25 MED ORDER — HEPARIN SODIUM (PORCINE) 1000 UNIT/ML IJ SOLN
INTRAMUSCULAR | Status: AC
  Filled 2017-12-25: qty 1

## 2017-12-25 MED ORDER — HEPARIN (PORCINE) IN NACL 100-0.45 UNIT/ML-% IJ SOLN
700.0000 [IU]/h | INTRAMUSCULAR | Status: DC
  Administered 2017-12-25: 700 [IU]/h via INTRAVENOUS
  Filled 2017-12-25: qty 250

## 2017-12-25 MED ORDER — FENTANYL CITRATE (PF) 100 MCG/2ML IJ SOLN
INTRAMUSCULAR | Status: DC | PRN
  Administered 2017-12-25: 25 ug via INTRAVENOUS

## 2017-12-25 MED ORDER — SODIUM CHLORIDE 0.9 % IV SOLN: 250.0000 mL | INTRAVENOUS | Status: DC | PRN

## 2017-12-25 MED ORDER — VERAPAMIL HCL 2.5 MG/ML IV SOLN
INTRAVENOUS | Status: DC | PRN
  Administered 2017-12-25: 10 mL via INTRA_ARTERIAL

## 2017-12-25 MED ORDER — SUCRALFATE 1 G PO TABS
1.0000 g | ORAL_TABLET | Freq: Three times a day (TID) | ORAL | Status: DC
  Administered 2017-12-25 – 2017-12-27 (×7): 1 g via ORAL
  Filled 2017-12-25 (×7): qty 1

## 2017-12-25 MED ORDER — HEPARIN (PORCINE) IN NACL 2-0.9 UNITS/ML
INTRAMUSCULAR | Status: AC | PRN
  Administered 2017-12-25: 500 mL

## 2017-12-25 MED ORDER — CLOPIDOGREL BISULFATE 75 MG PO TABS
75.0000 mg | ORAL_TABLET | Freq: Every day | ORAL | Status: DC
  Administered 2017-12-26 – 2017-12-27 (×2): 75 mg via ORAL
  Filled 2017-12-25 (×2): qty 1

## 2017-12-25 MED ORDER — ATORVASTATIN CALCIUM 10 MG PO TABS
10.0000 mg | ORAL_TABLET | Freq: Every day | ORAL | Status: DC
  Administered 2017-12-25 – 2017-12-26 (×2): 10 mg via ORAL
  Filled 2017-12-25 (×2): qty 1

## 2017-12-25 MED ORDER — ENOXAPARIN SODIUM 40 MG/0.4ML ~~LOC~~ SOLN: 40.0000 mg | SUBCUTANEOUS | Status: DC

## 2017-12-25 MED ORDER — ACETAMINOPHEN 500 MG PO TABS: 1000.0000 mg | ORAL_TABLET | Freq: Four times a day (QID) | ORAL | Status: DC | PRN

## 2017-12-25 MED ORDER — LOSARTAN POTASSIUM 50 MG PO TABS
100.0000 mg | ORAL_TABLET | Freq: Every day | ORAL | Status: DC
  Administered 2017-12-26 – 2017-12-27 (×2): 100 mg via ORAL
  Filled 2017-12-25 (×2): qty 2

## 2017-12-25 MED ORDER — FENTANYL CITRATE (PF) 100 MCG/2ML IJ SOLN
INTRAMUSCULAR | Status: AC
  Filled 2017-12-25: qty 2

## 2017-12-25 MED ORDER — SODIUM CHLORIDE 0.9% FLUSH: 3.0000 mL | INTRAVENOUS | Status: DC | PRN

## 2017-12-25 MED ORDER — LIDOCAINE HCL (PF) 1 % IJ SOLN
INTRAMUSCULAR | Status: AC
  Filled 2017-12-25: qty 30

## 2017-12-25 MED ORDER — HEPARIN SODIUM (PORCINE) 1000 UNIT/ML IJ SOLN
INTRAMUSCULAR | Status: DC | PRN
  Administered 2017-12-25: 3000 [IU] via INTRAVENOUS

## 2017-12-25 MED ORDER — GI COCKTAIL ~~LOC~~
30.0000 mL | Freq: Four times a day (QID) | ORAL | Status: DC | PRN
  Administered 2017-12-26: 30 mL via ORAL
  Filled 2017-12-25: qty 30

## 2017-12-25 MED ORDER — SODIUM CHLORIDE 0.9% FLUSH
3.0000 mL | Freq: Two times a day (BID) | INTRAVENOUS | Status: DC
  Administered 2017-12-26 – 2017-12-27 (×4): 3 mL via INTRAVENOUS

## 2017-12-25 MED ORDER — ASPIRIN EC 81 MG PO TBEC
81.0000 mg | DELAYED_RELEASE_TABLET | Freq: Every day | ORAL | Status: DC
  Administered 2017-12-26 – 2017-12-27 (×2): 81 mg via ORAL
  Filled 2017-12-25 (×2): qty 1

## 2017-12-25 MED ORDER — LIDOCAINE HCL (PF) 1 % IJ SOLN
INTRAMUSCULAR | Status: DC | PRN
  Administered 2017-12-25: 3 mL

## 2017-12-25 MED ORDER — MIDAZOLAM HCL 2 MG/2ML IJ SOLN
INTRAMUSCULAR | Status: DC | PRN
  Administered 2017-12-25: 1 mg via INTRAVENOUS

## 2017-12-25 MED ORDER — DORZOLAMIDE HCL-TIMOLOL MAL 2-0.5 % OP SOLN
1.0000 [drp] | Freq: Two times a day (BID) | OPHTHALMIC | Status: DC
  Administered 2017-12-25 – 2017-12-27 (×4): 1 [drp] via OPHTHALMIC
  Filled 2017-12-25: qty 10

## 2017-12-25 MED ORDER — HEPARIN SODIUM (PORCINE) 5000 UNIT/ML IJ SOLN
5000.0000 [IU] | Freq: Three times a day (TID) | INTRAMUSCULAR | Status: DC
  Administered 2017-12-26 – 2017-12-27 (×4): 5000 [IU] via SUBCUTANEOUS
  Filled 2017-12-25 (×4): qty 1

## 2017-12-25 MED ORDER — HEPARIN BOLUS VIA INFUSION
3500.0000 [IU] | Freq: Once | INTRAVENOUS | Status: AC
  Administered 2017-12-25: 3500 [IU] via INTRAVENOUS
  Filled 2017-12-25: qty 3500

## 2017-12-25 MED ORDER — LATANOPROST 0.005 % OP SOLN
1.0000 [drp] | Freq: Every day | OPHTHALMIC | Status: DC
  Administered 2017-12-25 – 2017-12-26 (×2): 1 [drp] via OPHTHALMIC
  Filled 2017-12-25: qty 2.5

## 2017-12-25 MED ORDER — MORPHINE SULFATE (PF) 2 MG/ML IV SOLN: 2.0000 mg | INTRAVENOUS | Status: DC | PRN

## 2017-12-25 MED ORDER — NITROGLYCERIN IN D5W 200-5 MCG/ML-% IV SOLN
0.0000 ug/min | INTRAVENOUS | Status: DC
  Filled 2017-12-25: qty 250

## 2017-12-25 MED ORDER — ASPIRIN 81 MG PO CHEW: 81.0000 mg | CHEWABLE_TABLET | ORAL | Status: DC

## 2017-12-25 MED ORDER — HYDROCHLOROTHIAZIDE 12.5 MG PO CAPS
12.5000 mg | ORAL_CAPSULE | Freq: Every day | ORAL | Status: DC
  Administered 2017-12-26 – 2017-12-27 (×2): 12.5 mg via ORAL
  Filled 2017-12-25 (×2): qty 1

## 2017-12-25 MED ORDER — LOSARTAN POTASSIUM-HCTZ 100-12.5 MG PO TABS: 1.0000 | ORAL_TABLET | Freq: Every day | ORAL | Status: DC

## 2017-12-25 MED ORDER — IOHEXOL 350 MG/ML SOLN
INTRAVENOUS | Status: DC | PRN
  Administered 2017-12-25: 60 mL via INTRA_ARTERIAL

## 2017-12-25 MED ORDER — SODIUM CHLORIDE 0.9 % IV SOLN
INTRAVENOUS | Status: DC
  Administered 2017-12-25: 17:00:00 via INTRAVENOUS

## 2017-12-25 MED ORDER — HEPARIN (PORCINE) IN NACL 1000-0.9 UT/500ML-% IV SOLN
INTRAVENOUS | Status: AC
  Filled 2017-12-25: qty 1000

## 2017-12-25 MED ORDER — SODIUM CHLORIDE 0.9 % WEIGHT BASED INFUSION: 1.0000 mL/kg/h | INTRAVENOUS | Status: AC

## 2017-12-25 MED ORDER — MIDAZOLAM HCL 2 MG/2ML IJ SOLN
INTRAMUSCULAR | Status: AC
  Filled 2017-12-25: qty 2

## 2017-12-25 SURGICAL SUPPLY — 14 items
CATH INFINITI 5 FR JL3.5 (CATHETERS) ×1 IMPLANT
CATH INFINITI 5FR ANG PIGTAIL (CATHETERS) ×1 IMPLANT
CATH INFINITI JR4 5F (CATHETERS) ×1 IMPLANT
DEVICE RAD COMP TR BAND LRG (VASCULAR PRODUCTS) ×1 IMPLANT
GUIDEWIRE INQWIRE 1.5J.035X260 (WIRE) IMPLANT
INQWIRE 1.5J .035X260CM (WIRE) ×2
KIT HEART LEFT (KITS) ×2 IMPLANT
NDL PERC 21GX4CM (NEEDLE) IMPLANT
NEEDLE PERC 21GX4CM (NEEDLE) ×2 IMPLANT
PACK CARDIAC CATHETERIZATION (CUSTOM PROCEDURE TRAY) ×2 IMPLANT
SHEATH RAIN RADIAL 21G 6FR (SHEATH) ×1 IMPLANT
SYR MEDRAD MARK V 150ML (SYRINGE) ×2 IMPLANT
TRANSDUCER W/STOPCOCK (MISCELLANEOUS) ×2 IMPLANT
TUBING CIL FLEX 10 FLL-RA (TUBING) ×2 IMPLANT

## 2017-12-25 NOTE — Progress Notes (Signed)
ANTICOAGULATION CONSULT NOTE - Initial Consult  Pharmacy Consult for heparin Indication: chest pain/ACS  No Known Allergies  Patient Measurements: Height: 5\' 3"  (160 cm) Weight: 128 lb (58.1 kg) IBW/kg (Calculated) : 52.4 Heparin Dosing Weight: 58 kg  Vital Signs: Temp: 98.2 F (36.8 C) (04/19 1026) Temp Source: Oral (04/19 1026) BP: 169/65 (04/19 1415) Pulse Rate: 49 (04/19 1415)  Labs: Recent Labs    12/25/17 1036 12/25/17 1246  HGB 12.3  --   HCT 37.1  --   PLT 256  --   CREATININE 0.94  --   TROPONINI  --  0.29*    Estimated Creatinine Clearance: 34.9 mL/min (by C-G formula based on SCr of 0.94 mg/dL).   Medical History: Past Medical History:  Diagnosis Date  . Antral ulcer    chronic atrophic gastritis with intestinal metaplasia and surface erosion  . CAD (coronary artery disease)    Pt. develpoed chest burning w/raiation to her L arm /exertion in 08. She went to ER, found to have NSTEMI, LHC showed long 75-80% proximal to mid LAD stenosis w/99% ostial D2 stenosis and diffuse mild to moderate RCA disease. She had 2 Cypher stents to the proximal to mid LAD. Echo (5/11): EF 60-65%, grade 1 diastolic dysfunction, normal wall motion, no significant valvular abnormalities.   . Glaucoma   . H/O: hysterectomy   . HTN (hypertension)   . Hyperlipidemia   . Iron deficiency anemia    with normal colonoscopy last year per her report.   . Osteoarthritis of hip   . S/P subtotal thyroidectomy      Assessment: 84 yo female with known CAD. Admitted with chest pain. Trop is positive. To start heparin gtt for rule out ACS. SCr 0.9, cbc ok.  Goal of Therapy:  Heparin level 0.3-0.7 units/ml Monitor platelets by anticoagulation protocol: Yes   Plan:  Heparin bolus 3500 units x1 then 700 units/hr Daily HL, CBC Check level in 8 hours   Baldemar FridayMasters, Marilyne Haseley M 12/25/2017,2:31 PM

## 2017-12-25 NOTE — Interval H&P Note (Signed)
  Cath Lab Visit (complete for each Cath Lab visit)  Clinical Evaluation Leading to the Procedure:   ACS: Yes.    Non-ACS:    Anginal Classification: CCS IV  Anti-ischemic medical therapy: Minimal Therapy (1 class of medications)  Non-Invasive Test Results: No non-invasive testing performed  Prior CABG: No previous CABG      History and Physical Interval Note:  12/25/2017 3:58 PM  Bonnie Morton  has presented today for surgery, with the diagnosis of cp  The various methods of treatment have been discussed with the patient and family. After consideration of risks, benefits and other options for treatment, the patient has consented to  Procedure(s): LEFT HEART CATH AND CORONARY ANGIOGRAPHY (N/A) as a surgical intervention .  The patient's history has been reviewed, patient examined, no change in status, stable for surgery.  I have reviewed the patient's chart and labs.  Questions were answered to the patient's satisfaction.     Tonny BollmanMichael Floyd Wade

## 2017-12-25 NOTE — ED Triage Notes (Signed)
Pt arrives from home with substernal chest pain that started at 0800 while doing chores in kitchen. Pt was given 2 sl nitro tablets with ems arrives pain free to ED. Pt took 1 81 mg ASA this morning and ems gave 3 81 mg ASA.

## 2017-12-25 NOTE — ED Notes (Signed)
Attempted report 

## 2017-12-25 NOTE — H&P (View-Only) (Signed)
Cardiology Consultation:   Patient ID: Bonnie Morton; [id number]; 08-13-1928   Admit date: 12/25/2017 Date of Consult: 12/25/2017  Primary Care Provider: Andi Devon, MD Primary Cardiologist: remotely Dr. Bing Matter  Primary Electrophysiologist:  NA   Patient Profile:   Bonnie Morton is a 84 y.o. female with a hx of CAD hx NSTEMI 2008 with PCI prox to mLAD, HTN, HLD, subtotal thyroidectomy who is being seen today for the evaluation of chest pain at the request of Dr. Darnelle Catalan.  History of Present Illness:   [name] has a a hx of CAD hx NSTEMI 2008 with PCI prox to mLAD, HTN, HLD, subtotal thyroidectomy last seen by Dr. Shirlee Latch in 2012.  Now admitted today with chest pain that began when she was doing cleaning in kitchen.  She took ASA, and 2 SL NTG were given by EMS.  Pain resolved with NTG.   EKG I personally reviewed.  SB at 54 with PACs she was noted to have HR to 49.  No acute EKG changes  Troponin 0.29 up from poc of 0.07 Na  140, K+ 3.5, Cr 0.95   H/H 12.3/37.1  2V CXR No acute cardiopulmonary disease.  Currently no pain but her HR still drops to 46, BP is elevated --she took 150 mg of plavix this AM and her metoprolol tartrate 25 mg.  She is also on Benicar - she was on losartan chtz but stopped due to re-call per pt.    Past Medical History:  Diagnosis Date  . Antral ulcer    chronic atrophic gastritis with intestinal metaplasia and surface erosion  . CAD (coronary artery disease)    Pt. develpoed chest burning w/raiation to her L arm /exertion in 08. She went to ER, found to have NSTEMI, LHC showed long 75-80% proximal to mid LAD stenosis w/99% ostial D2 stenosis and diffuse mild to moderate RCA disease. She had 2 Cypher stents to the proximal to mid LAD. Echo (5/11): EF 60-65%, grade 1 diastolic dysfunction, normal wall motion, no significant valvular abnormalities.   . Glaucoma   . H/O: hysterectomy   . HTN (hypertension)   . Hyperlipidemia   . Iron deficiency  anemia    with normal colonoscopy last year per her report.   . Osteoarthritis of hip   . S/P subtotal thyroidectomy     Past Surgical History:  Procedure Laterality Date  . ABDOMINAL HYSTERECTOMY    . ESOPHAGOGASTRODUODENOSCOPY  08/03/2011   Procedure: ESOPHAGOGASTRODUODENOSCOPY (EGD);  Surgeon: Hart Carwin, MD;  Location: [facility];  Service: Endoscopy;  Laterality: N/A;  . EYE SURGERY     R eye cataract surgery  . THYROIDECTOMY     subtotal     Home Medications:  Prior to Admission medications   Medication Sig Start Date End Date Taking? Authorizing Provider  acetaminophen (TYLENOL) 500 MG tablet Take 1,000 mg by mouth as needed for mild pain or headache.   Yes [provider]  aspirin 81 MG tablet Take 81 mg by mouth daily.    Yes [provider]  atorvastatin (LIPITOR) 10 MG tablet Take 10 mg by mouth daily. 10/26/17  Yes [provider]  brimonidine (ALPHAGAN) 0.2 % ophthalmic solution Place 1 drop into both eyes every 8 (eight) hours.    Yes [provider]  clopidogrel (PLAVIX) 75 MG tablet Take 1 tablet (75 mg total) by mouth daily. 08/05/11  Yes Rodolph Bong, MD  DM-APAP-CPM (CORICIDIN HBP FLU PO) Take 1 tablet by mouth  as needed (flu symptoms).   Yes [provider]  dorzolamide-timolol (COSOPT) 22.3-6.8 MG/ML ophthalmic solution Place 1 drop into both eyes 2 (two) times daily.    Yes [provider]  latanoprost (XALATAN) 0.005 % ophthalmic solution Place 1 drop into both eyes Daily. 02/27/11  Yes [provider]  losartan-hydrochlorothiazide (HYZAAR) 100-12.5 MG tablet Take 1 tablet by mouth daily. 10/26/17  Yes [provider]  metoprolol tartrate (LOPRESSOR) 25 MG tablet Take 25 mg by mouth daily.    Yes [provider]  Sennosides (SENNA-EX PO) Take 1 tablet by mouth daily as needed (constipation).   Yes [provider]  docusate sodium (COLACE) 100 MG capsule 200mg  2 times  daily Patient not taking: Reported on 12/25/2017 08/05/11   Rodolph Bong, MD  olmesartan (BENICAR) 40 MG tablet Take 1 tablet (40 mg total) by mouth daily. 08/05/11 08/04/12  Rodolph Bong, MD  pantoprazole (PROTONIX) 40 MG tablet Take 1 tablet (40 mg total) by mouth daily at 6 (six) AM. 08/05/11 08/04/12  Rodolph Bong, MD  sucralfate (CARAFATE) 1 G tablet Take 1 tablet (1 g total) by mouth 4 (four) times daily -  with meals and at bedtime. 08/05/11 08/04/12  Rodolph Bong, MD    Inpatient Medications: Scheduled Meds:  Continuous Infusions:  PRN Meds:   Allergies:   No Known Allergies  Social History:   Social History   Socioeconomic History  . Marital status: Divorced    Spouse name: Not on file  . Number of children: Not on file  . Years of education: Not on file  . Highest education level: Not on file  Occupational History  . Not on file  Social Needs  . Financial resource strain: Not on file  . Food insecurity:    Worry: Not on file    Inability: Not on file  . Transportation needs:    Medical: Not on file    Non-medical: Not on file  Tobacco Use  . Smoking status: Never Smoker  Substance and Sexual Activity  . Alcohol use: No  . Drug use: Not on file  . Sexual activity: Not Currently  Lifestyle  . Physical activity:    Days per week: Not on file    Minutes per session: Not on file  . Stress: Not on file  Relationships  . Social connections:    Talks on phone: Not on file    Gets together: Not on file    Attends religious service: Not on file    Active member of club or organization: Not on file    Attends meetings of clubs or organizations: Not on file    Relationship status: Not on file  . Intimate partner violence:    Fear of current or ex partner: Not on file    Emotionally abused: Not on file    Physically abused: Not on file    Forced sexual activity: Not on file  Other Topics Concern  . 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 Moved from [place] to [place] in late 1990's- father was from [place] area)    Family History:    Family History  Problem Relation Age of Onset  . Hypertension Mother   . Lung cancer Father      ROS:  Please see the history of present illness.  General:no colds or fevers, + weight loss, initially she was trying to lose but now she has  had wt loss without trying  Skin:no rashes or ulcers HEENT:no blurred vision, no congestion CV:see HPI PUL:see HPI GI:no diarrhea constipation or melena, no indigestion GU:no hematuria, no dysuria MS:no joint pain, no claudication Neuro:no syncope, no lightheadedness Endo:no diabetes, no thyroid disease  All other ROS reviewed and negative.     Physical Exam/Data:   Vitals:   12/25/17 1026 12/25/17 1130 12/25/17 1145 12/25/17 1315  BP:  (!) 151/87 (!) 150/71 (!) 158/62  Pulse:  (!) 51 (!) 49 (!) 45  Resp:  14 13 11   Temp: 98.2 F (36.8 C)     TempSrc: Oral     SpO2:  100% 100% 100%   No intake or output data in the 24 hours ending 12/25/17 1349 There were no vitals filed for this visit. There is no height or weight on file to calculate BMI.  General:  Well nourished, thin, female in no acute distress HEENT: normal Lymph: no adenopathy Neck: mild JVD Endocrine:  No thryomegaly Vascular: No carotid bruits; pedal pulses 1+ bilaterally  Cardiac:  normal S1, S2; RRR; no murmur gallup rub or click, slow and with premature beats Lungs:  clear to auscultation bilaterally, no wheezing, rhonchi or rales  Abd: soft, nontender, no hepatomegaly  Ext: no to tr edema of ankles Musculoskeletal:  No deformities, BUE and BLE strength normal and equal Skin: warm and dry  Neuro:  Alert and oriented, MAE follows commands, no focal abnormalities noted Psych:  Normal affect    Relevant CV Studies: No recent , cath as above.  Laboratory Data:  Chemistry Recent Labs  Lab 12/25/17 1036  NA 140  K 3.5  CL 105  CO2 23    GLUCOSE 125*  BUN 32*  CREATININE 0.94  CALCIUM 11.0*  GFRNONAA 53*  GFRAA >60  ANIONGAP 12    No results for input(s): PROT, ALBUMIN, AST, ALT, ALKPHOS, BILITOT in the last 168 hours. Hematology Recent Labs  Lab 12/25/17 1036  WBC 5.7  RBC 3.79*  HGB 12.3  HCT 37.1  MCV 97.9  MCH 32.5  MCHC 33.2  RDW 13.3  PLT 256   Cardiac EnzymesNo results for input(s): TROPONINI in the last 168 hours.  Recent Labs  Lab 12/25/17 1040  TROPIPOC 0.07    BNPNo results for input(s): BNP, PROBNP in the last 168 hours.  DDimer No results for input(s): DDIMER in the last 168 hours.  Radiology/Studies:  Dg Chest 2 View  Result Date: 12/25/2017 CLINICAL DATA:  Shortness of breath.  Chest pain. EXAM: CHEST - 2 VIEW COMPARISON:  No recent. FINDINGS: Mediastinum and hilar structures normal. Cardiomegaly with normal pulmonary vascularity. No focal infiltrate. No pleural effusion or pneumothorax. Degenerative changes thoracic spine. Thoracic spine scoliosis. IMPRESSION: No acute cardiopulmonary disease. Electronically Signed   By: Maisie Fus  Register   On: 12/25/2017 12:53    Assessment and Plan:   1. NSTEMI troponin 0.29 - IV heparin, serial troponins  Possible cath today--Dr. Delton See to see  Will order echo agree with admit. 2. CAD with stents to LAD in 2008  3. HTN elevated today just changed fro lisinopril hctz to benicar may need amlodipine  4. HLD on statin agree with checking lipids  5. Sinus brady to 46 - she did take BB today agree with holding   For questions or updates, please contact [facility] Please consult [email or web address] for contact info under Cardiology/STEMI.   Signed, Nada Boozer, NP  12/25/2017 1:49 PM   The patient was seen, examined and  discussed with Nada BoozerLaura Ingold, NP and I agree with the above.   A very sweet 84 year old female with h/o CAD, h/o NSTEMI in 2008, s/p PCI/stents to ostial and mid LAD by Dr Elsie LincolnGamble, last followed by Dr Shirlee LatchMcLean in 2012, doing great, very  independent compliant, followed by her PCP< presented with a restrosternal CP, ECG unchanged, troponin 0.29. CP has resolved with NTG, currently asymptomatic. BP elevated on arrival > 200. On physical exam nop JVD, no murmurs, no signs of CHF.  Plan: LHC this pm, Crea normal Start Heparin drip, NTG drip If no significant stenosis on cath, aggressive BP management.  Tobias AlexanderKatarina Kassady Laboy, MD. 12/25/2017

## 2017-12-25 NOTE — Consult Note (Addendum)
Cardiology Consultation:   Patient ID: [name]; [id number]; 08-13-1928   Admit date: 12/25/2017 Date of Consult: 12/25/2017  Primary Care Provider: Andi Devon, MD Primary Cardiologist: remotely Dr. Bing Matter  Primary Electrophysiologist:  NA   Patient Profile:   Ahsley Attwood is a 84 y.o. female with a hx of CAD hx NSTEMI 2008 with PCI prox to mLAD, HTN, HLD, subtotal thyroidectomy who is being seen today for the evaluation of chest pain at the request of Dr. Darnelle Catalan.  History of Present Illness:   [name] has a a hx of CAD hx NSTEMI 2008 with PCI prox to mLAD, HTN, HLD, subtotal thyroidectomy last seen by Dr. Shirlee Latch in 2012.  Now admitted today with chest pain that began when she was doing cleaning in kitchen.  She took ASA, and 2 SL NTG were given by EMS.  Pain resolved with NTG.   EKG I personally reviewed.  SB at 54 with PACs she was noted to have HR to 49.  No acute EKG changes  Troponin 0.29 up from poc of 0.07 Na  140, K+ 3.5, Cr 0.95   H/H 12.3/37.1  2V CXR No acute cardiopulmonary disease.  Currently no pain but her HR still drops to 46, BP is elevated --she took 150 mg of plavix this AM and her metoprolol tartrate 25 mg.  She is also on Benicar - she was on losartan chtz but stopped due to re-call per pt.    Past Medical History:  Diagnosis Date  . Antral ulcer    chronic atrophic gastritis with intestinal metaplasia and surface erosion  . CAD (coronary artery disease)    Pt. develpoed chest burning w/raiation to her L arm /exertion in 08. She went to ER, found to have NSTEMI, LHC showed long 75-80% proximal to mid LAD stenosis w/99% ostial D2 stenosis and diffuse mild to moderate RCA disease. She had 2 Cypher stents to the proximal to mid LAD. Echo (5/11): EF 60-65%, grade 1 diastolic dysfunction, normal wall motion, no significant valvular abnormalities.   . Glaucoma   . H/O: hysterectomy   . HTN (hypertension)   . Hyperlipidemia   . Iron deficiency  anemia    with normal colonoscopy last year per her report.   . Osteoarthritis of hip   . S/P subtotal thyroidectomy     Past Surgical History:  Procedure Laterality Date  . ABDOMINAL HYSTERECTOMY    . ESOPHAGOGASTRODUODENOSCOPY  08/03/2011   Procedure: ESOPHAGOGASTRODUODENOSCOPY (EGD);  Surgeon: Hart Carwin, MD;  Location: [facility];  Service: Endoscopy;  Laterality: N/A;  . EYE SURGERY     R eye cataract surgery  . THYROIDECTOMY     subtotal     Home Medications:  Prior to Admission medications   Medication Sig Start Date End Date Taking? Authorizing Provider  acetaminophen (TYLENOL) 500 MG tablet Take 1,000 mg by mouth as needed for mild pain or headache.   Yes [provider]  aspirin 81 MG tablet Take 81 mg by mouth daily.    Yes [provider]  atorvastatin (LIPITOR) 10 MG tablet Take 10 mg by mouth daily. 10/26/17  Yes [provider]  brimonidine (ALPHAGAN) 0.2 % ophthalmic solution Place 1 drop into both eyes every 8 (eight) hours.    Yes [provider]  clopidogrel (PLAVIX) 75 MG tablet Take 1 tablet (75 mg total) by mouth daily. 08/05/11  Yes Rodolph Bong, MD  DM-APAP-CPM (CORICIDIN HBP FLU PO) Take 1 tablet by mouth  as needed (flu symptoms).   Yes [provider]  dorzolamide-timolol (COSOPT) 22.3-6.8 MG/ML ophthalmic solution Place 1 drop into both eyes 2 (two) times daily.    Yes [provider]  latanoprost (XALATAN) 0.005 % ophthalmic solution Place 1 drop into both eyes Daily. 02/27/11  Yes [provider]  losartan-hydrochlorothiazide (HYZAAR) 100-12.5 MG tablet Take 1 tablet by mouth daily. 10/26/17  Yes [provider]  metoprolol tartrate (LOPRESSOR) 25 MG tablet Take 25 mg by mouth daily.    Yes [provider]  Sennosides (SENNA-EX PO) Take 1 tablet by mouth daily as needed (constipation).   Yes [provider]  docusate sodium (COLACE) 100 MG capsule 200mg  2 times  daily Patient not taking: Reported on 12/25/2017 08/05/11   Rodolph Bong, MD  olmesartan (BENICAR) 40 MG tablet Take 1 tablet (40 mg total) by mouth daily. 08/05/11 08/04/12  Rodolph Bong, MD  pantoprazole (PROTONIX) 40 MG tablet Take 1 tablet (40 mg total) by mouth daily at 6 (six) AM. 08/05/11 08/04/12  Rodolph Bong, MD  sucralfate (CARAFATE) 1 G tablet Take 1 tablet (1 g total) by mouth 4 (four) times daily -  with meals and at bedtime. 08/05/11 08/04/12  Rodolph Bong, MD    Inpatient Medications: Scheduled Meds:  Continuous Infusions:  PRN Meds:   Allergies:   No Known Allergies  Social History:   Social History   Socioeconomic History  . Marital status: Divorced    Spouse name: Not on file  . Number of children: Not on file  . Years of education: Not on file  . Highest education level: Not on file  Occupational History  . Not on file  Social Needs  . Financial resource strain: Not on file  . Food insecurity:    Worry: Not on file    Inability: Not on file  . Transportation needs:    Medical: Not on file    Non-medical: Not on file  Tobacco Use  . Smoking status: Never Smoker  Substance and Sexual Activity  . Alcohol use: No  . Drug use: Not on file  . Sexual activity: Not Currently  Lifestyle  . Physical activity:    Days per week: Not on file    Minutes per session: Not on file  . Stress: Not on file  Relationships  . Social connections:    Talks on phone: Not on file    Gets together: Not on file    Attends religious service: Not on file    Active member of club or organization: Not on file    Attends meetings of clubs or organizations: Not on file    Relationship status: Not on file  . Intimate partner violence:    Fear of current or ex partner: Not on file    Emotionally abused: Not on file    Physically abused: Not on file    Forced sexual activity: Not on file  Other Topics Concern  . 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 Moved from [place] to [place] in late 1990's- father was from [place] area)    Family History:    Family History  Problem Relation Age of Onset  . Hypertension Mother   . Lung cancer Father      ROS:  Please see the history of present illness.  General:no colds or fevers, + weight loss, initially she was trying to lose but now she has  had wt loss without trying  Skin:no rashes or ulcers HEENT:no blurred vision, no congestion CV:see HPI PUL:see HPI GI:no diarrhea constipation or melena, no indigestion GU:no hematuria, no dysuria MS:no joint pain, no claudication Neuro:no syncope, no lightheadedness Endo:no diabetes, no thyroid disease  All other ROS reviewed and negative.     Physical Exam/Data:   Vitals:   12/25/17 1026 12/25/17 1130 12/25/17 1145 12/25/17 1315  BP:  (!) 151/87 (!) 150/71 (!) 158/62  Pulse:  (!) 51 (!) 49 (!) 45  Resp:  14 13 11   Temp: 98.2 F (36.8 C)     TempSrc: Oral     SpO2:  100% 100% 100%   No intake or output data in the 24 hours ending 12/25/17 1349 There were no vitals filed for this visit. There is no height or weight on file to calculate BMI.  General:  Well nourished, thin, female in no acute distress HEENT: normal Lymph: no adenopathy Neck: mild JVD Endocrine:  No thryomegaly Vascular: No carotid bruits; pedal pulses 1+ bilaterally  Cardiac:  normal S1, S2; RRR; no murmur gallup rub or click, slow and with premature beats Lungs:  clear to auscultation bilaterally, no wheezing, rhonchi or rales  Abd: soft, nontender, no hepatomegaly  Ext: no to tr edema of ankles Musculoskeletal:  No deformities, BUE and BLE strength normal and equal Skin: warm and dry  Neuro:  Alert and oriented, MAE follows commands, no focal abnormalities noted Psych:  Normal affect    Relevant CV Studies: No recent , cath as above.  Laboratory Data:  Chemistry Recent Labs  Lab 12/25/17 1036  NA 140  K 3.5  CL 105  CO2 23    GLUCOSE 125*  BUN 32*  CREATININE 0.94  CALCIUM 11.0*  GFRNONAA 53*  GFRAA >60  ANIONGAP 12    No results for input(s): PROT, ALBUMIN, AST, ALT, ALKPHOS, BILITOT in the last 168 hours. Hematology Recent Labs  Lab 12/25/17 1036  WBC 5.7  RBC 3.79*  HGB 12.3  HCT 37.1  MCV 97.9  MCH 32.5  MCHC 33.2  RDW 13.3  PLT 256   Cardiac EnzymesNo results for input(s): TROPONINI in the last 168 hours.  Recent Labs  Lab 12/25/17 1040  TROPIPOC 0.07    BNPNo results for input(s): BNP, PROBNP in the last 168 hours.  DDimer No results for input(s): DDIMER in the last 168 hours.  Radiology/Studies:  Dg Chest 2 View  Result Date: 12/25/2017 CLINICAL DATA:  Shortness of breath.  Chest pain. EXAM: CHEST - 2 VIEW COMPARISON:  No recent. FINDINGS: Mediastinum and hilar structures normal. Cardiomegaly with normal pulmonary vascularity. No focal infiltrate. No pleural effusion or pneumothorax. Degenerative changes thoracic spine. Thoracic spine scoliosis. IMPRESSION: No acute cardiopulmonary disease. Electronically Signed   By: Maisie Fus  Register   On: 12/25/2017 12:53    Assessment and Plan:   1. NSTEMI troponin 0.29 - IV heparin, serial troponins  Possible cath today--Dr. Delton See to see  Will order echo agree with admit. 2. CAD with stents to LAD in 2008  3. HTN elevated today just changed fro lisinopril hctz to benicar may need amlodipine  4. HLD on statin agree with checking lipids  5. Sinus brady to 46 - she did take BB today agree with holding   For questions or updates, please contact [facility] Please consult [email or web address] for contact info under Cardiology/STEMI.   Signed, Nada Boozer, NP  12/25/2017 1:49 PM   The patient was seen, examined and  discussed with Nada BoozerLaura Ingold, NP and I agree with the above.   A very sweet 84 year old female with h/o CAD, h/o NSTEMI in 2008, s/p PCI/stents to ostial and mid LAD by Dr Elsie LincolnGamble, last followed by Dr Shirlee LatchMcLean in 2012, doing great, very  independent compliant, followed by her PCP< presented with a restrosternal CP, ECG unchanged, troponin 0.29. CP has resolved with NTG, currently asymptomatic. BP elevated on arrival > 200. On physical exam nop JVD, no murmurs, no signs of CHF.  Plan: LHC this pm, Crea normal Start Heparin drip, NTG drip If no significant stenosis on cath, aggressive BP management.  Tobias AlexanderKatarina Grettell Ransdell, MD. 12/25/2017

## 2017-12-25 NOTE — H&P (Signed)
History and Physical:    Bonnie Morton   BJY:[id number] DOB: 05/31/1928 DOA: 12/25/2017  Referring MD/provider: Alvira MondaySchlossman, Erin, MD PCP: Andi DevonShelton, Kimberly, MD   Patient coming from: Home.  Chief Complaint: chest pain  History of Present Illness:   Bonnie Morton is an 84 y.o. female with a PMH of CAD status post stents in 2008, hyperlipidemia, hypertension who presents with a chief complaint of 10/10 chest pain that began when she was doing chores in her kitchen. She took aspirin and when EMS arrived, she was given 2 sublingual nitroglycerin tablets and an additional aspirin and reported relief with the nitroglycerin. There was no associated nausea, vomiting, diaphoresis or palpitations. Patient states that she has never had a pain similar to this. She has not been under the regular care of a cardiologist, but does take a daily aspirin.  ED Course:  The patient was mildly bradycardic with heart rates as low as 49, but otherwise hemodynamically stable. Initial troponin was 0.07.chest x-ray showed cardiomegaly but no infiltrates, effusions, or other acute findings.EKG showed an incomplete right bundle branch block and a left anterior fascicular block with premature atrial complexes but is unchanged from prior.  ROS:   Review of Systems  Constitutional: Positive for weight loss.  HENT: Negative.   Eyes: Negative.   Respiratory: Negative.   Cardiovascular: Positive for chest pain.  Gastrointestinal: Positive for constipation.  Genitourinary: Negative.   Musculoskeletal: Negative.   Skin: Negative.   Neurological: Negative.   Endo/Heme/Allergies: Negative.   Psychiatric/Behavioral: Negative.     Past Medical History:   Past Medical History:  Diagnosis Date  . Antral ulcer    chronic atrophic gastritis with intestinal metaplasia and surface erosion  . CAD (coronary artery disease)    Pt. develpoed chest burning w/raiation to her L arm /exertion in 08. She went to ER, found  to have NSTEMI, LHC showed long 75-80% proximal to mid LAD stenosis w/99% ostial D2 stenosis and diffuse mild to moderate RCA disease. She had 2 Cypher stents to the proximal to mid LAD. Echo (5/11): EF 60-65%, grade 1 diastolic dysfunction, normal wall motion, no significant valvular abnormalities.   . Glaucoma   . H/O: hysterectomy   . HTN (hypertension)   . Hyperlipidemia   . Iron deficiency anemia    with normal colonoscopy last year per her report.   . Osteoarthritis of hip   . S/P subtotal thyroidectomy     Past Surgical History:   Past Surgical History:  Procedure Laterality Date  . ABDOMINAL HYSTERECTOMY    . ESOPHAGOGASTRODUODENOSCOPY  08/03/2011   Procedure: ESOPHAGOGASTRODUODENOSCOPY (EGD);  Surgeon: Hart Carwinora M Brodie, MD;  Location: [facility];  Service: Endoscopy;  Laterality: N/A;  . EYE SURGERY     R eye cataract surgery  . THYROIDECTOMY     subtotal    Social History:   Social History   Socioeconomic History  . Marital status: Divorced    Spouse name: Not on file  . Number of children: Not on file  . Years of education: Not on file  . Highest education level: Not on file  Occupational History  . Not on file  Social Needs  . Financial resource strain: Not on file  . Food insecurity:    Worry: Not on file    Inability: Not on file  . Transportation needs:    Medical: Not on file    Non-medical: Not on file  Tobacco Use  . Smoking status: Never Smoker  Substance  and Sexual Activity  . Alcohol use: No  . Drug use: Not on file  . Sexual activity: Not Currently  Lifestyle  . Physical activity:    Days per week: Not on file    Minutes per session: Not on file  . Stress: Not on file  Relationships  . Social connections:    Talks on phone: Not on file    Gets together: Not on file    Attends religious service: Not on file    Active member of club or organization: Not on file    Attends meetings of clubs or organizations: Not on file    Relationship  status: Not on file  . Intimate partner violence:    Fear of current or ex partner: Not on file    Emotionally abused: Not on file    Physically abused: Not on file    Forced sexual activity: Not on file  Other Topics Concern  . 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 Moved from [place] to [place] in late 1990's- father was from [place] area)    Allergies   Patient has no known allergies.  Family history:   Family History  Problem Relation Age of Onset  . Hypertension Mother   . Lung cancer Father     Current Medications:   Prior to Admission medications   Medication Sig Start Date End Date Taking? Authorizing Provider  acetaminophen (TYLENOL) 500 MG tablet Take 1,000 mg by mouth as needed for mild pain or headache.   Yes [provider]  aspirin 81 MG tablet Take 81 mg by mouth daily.    Yes [provider]  atorvastatin (LIPITOR) 10 MG tablet Take 10 mg by mouth daily. 10/26/17  Yes [provider]  brimonidine (ALPHAGAN) 0.2 % ophthalmic solution Place 1 drop into both eyes every 8 (eight) hours.    Yes [provider]  clopidogrel (PLAVIX) 75 MG tablet Take 1 tablet (75 mg total) by mouth daily. 08/05/11  Yes Rodolph Bong, MD  DM-APAP-CPM (CORICIDIN HBP FLU PO) Take 1 tablet by mouth as needed (flu symptoms).   Yes [provider]  dorzolamide-timolol (COSOPT) 22.3-6.8 MG/ML ophthalmic solution Place 1 drop into both eyes 2 (two) times daily.    Yes [provider]  latanoprost (XALATAN) 0.005 % ophthalmic solution Place 1 drop into both eyes Daily. 02/27/11  Yes [provider]  losartan-hydrochlorothiazide (HYZAAR) 100-12.5 MG tablet Take 1 tablet by mouth daily. 10/26/17  Yes [provider]  metoprolol tartrate (LOPRESSOR) 25 MG tablet Take 25 mg by mouth daily.    Yes [provider]  Sennosides (SENNA-EX PO) Take 1 tablet by mouth daily as needed (constipation).   Yes  [provider]  docusate sodium (COLACE) 100 MG capsule 200mg  2 times daily Patient not taking: Reported on 12/25/2017 08/05/11   Rodolph Bong, MD  olmesartan (BENICAR) 40 MG tablet Take 1 tablet (40 mg total) by mouth daily. 08/05/11 08/04/12  Rodolph Bong, MD  pantoprazole (PROTONIX) 40 MG tablet Take 1 tablet (40 mg total) by mouth daily at 6 (six) AM. 08/05/11 08/04/12  Rodolph Bong, MD  sucralfate (CARAFATE) 1 G tablet Take 1 tablet (1 g total) by mouth 4 (four) times daily -  with meals and at bedtime. 08/05/11 08/04/12  Rodolph Bong, MD    Physical Exam:   Vitals:   12/25/17 1025 12/25/17 1026 12/25/17 1130 12/25/17 1145  BP:   (!) 151/87 (!) 150/71  Pulse: (!) 52  (!) 51 (!) 49  Resp: 14  14 13   Temp:  98.2 F (36.8 C)    TempSrc:  Oral    SpO2: 96%  100% 100%     Physical Exam: Blood pressure (!) 150/71, pulse (!) 49, temperature 98.2 F (36.8 C), temperature source Oral, resp. rate 13, SpO2 100 %. Gen: No acute distress. Head: Normocephalic, atraumatic. Eyes: Pupils equal, round and reactive to light. Extraocular movements intact.  Sclerae nonicteric. No lid lag. Mouth: Oropharynx reveals moist mucous membranes. Dentition is good. Neck: Supple, no thyromegaly, no lymphadenopathy, no jugular venous distention. Chest: Lungs are clear to auscultation with good air movement. No rales, rhonchi or wheezes.  CV: Heart sounds are regular/bradycardic with an S1, S2. No murmurs, rubs, clicks, or gallops.  Abdomen: Soft, nontender, nondistended with normal active bowel sounds. No hepatosplenomegaly or palpable masses. Extremities: Extremities are without clubbing, or cyanosis. No edema. Pedal pulses 2+.  Skin: Warm and dry. No rashes, lesions or wounds. Neuro: Alert and oriented times 3; grossly nonfocal.  Psych: Insight is good and judgment is appropriate. Mood and affect normal.   Data Review:    Labs: Basic Metabolic Panel: Recent Labs    Lab 12/25/17 1036  NA 140  K 3.5  CL 105  CO2 23  GLUCOSE 125*  BUN 32*  CREATININE 0.94  CALCIUM 11.0*   CBC: Recent Labs  Lab 12/25/17 1036  WBC 5.7  HGB 12.3  HCT 37.1  MCV 97.9  PLT 256    Urinalysis    Component Value Date/Time   COLORURINE YELLOW 08/02/2011 1420   APPEARANCEUR CLEAR 08/02/2011 1420   LABSPEC 1.008 08/02/2011 1420   PHURINE 7.5 08/02/2011 1420   GLUCOSEU NEGATIVE 08/02/2011 1420   HGBUR NEGATIVE 08/02/2011 1420   BILIRUBINUR NEGATIVE 08/02/2011 1420   KETONESUR NEGATIVE 08/02/2011 1420   PROTEINUR NEGATIVE 08/02/2011 1420   UROBILINOGEN 0.2 08/02/2011 1420   NITRITE NEGATIVE 08/02/2011 1420   LEUKOCYTESUR NEGATIVE 08/02/2011 1420      Radiographic Studies: Dg Chest 2 View  Result Date: 12/25/2017 CLINICAL DATA:  Shortness of breath.  Chest pain. EXAM: CHEST - 2 VIEW COMPARISON:  No recent. FINDINGS: Mediastinum and hilar structures normal. Cardiomegaly with normal pulmonary vascularity. No focal infiltrate. No pleural effusion or pneumothorax. Degenerative changes thoracic spine. Thoracic spine scoliosis. IMPRESSION: No acute cardiopulmonary disease. Electronically Signed   By: Maisie Fus  Register   On: 12/25/2017 12:53    EKG: Independently reviewed. Sinus rhythm at 54 bpm with premature atrial complexes, RBBB and LAFB.   Assessment/Plan:   Principal Problem:   Chest pain in a patient with known CAD status post stent in 2008/bradycardia Admit to telemetry, cycle cardiac markers, check fasting lipid panel to ensure lipids are well-controlled. We'll notify cardiology of the patient's admission to consider stress test.  Continue aspirin/Plavix, statin. Hold beta blocker given bradycardia.  Active Problems:   Essential hypertension Continue losartan/HCTZ. Also on Benicar. Not sure why she would be on 2 different ARBs so will hold the Benicar.old beta blocker secondary to bradycardia.    Hyperlipidemia Continue statin. Check fasting lipid  panel.   Other information:   DVT prophylaxis: Lovenox ordered. Code Status: DO NOT RESUSCITATE. Family Communication: No family present at bedside. Disposition Plan: Home tomorrow if workup negative and no inpatient workup recommended by cardiology. Consults called: Cardiology consultation requested through Epic. Admission status: Observation.   Trula Ore Rama [facility] Pager [phone])  [phone] Cell: [phone]   If 7PM-7AM, please contact night-coverage [email or web address] Password [email or web address] 12/25/2017, 1:20 PM

## 2017-12-25 NOTE — ED Notes (Signed)
CRITICAL VALUE ALERT  Critical Value:  0.29 TROPONIN  Date & Time Notied:  04/199/2018, 14:06  Provider Notified:Schlossman

## 2017-12-25 NOTE — ED Provider Notes (Signed)
[facility] [place] [facility] Provider Note   CSN: [id number] Arrival date & time: 12/25/17  1023     History   Chief Complaint Chief Complaint  Patient presents with  . Chest Pain    HPI Bonnie Morton is a 84 y.o. female.  HPI   85 year old female with a history of coronary artery disease, hypertension, hyperlipidemia presents with concern for chest pain that began suddenly at 8 AM.  Patient reports that when she woke up at 6 in the morning, she was feeling normal, at her baseline, and while she was doing chores in the kitchen at 8 AM she suddenly developed crushing substernal chest pressure.  Reports the pain is severe, 10 out of 10, worse with exertion.  Reports that she laid down in bed, but the pain did not improved and she called EMS.  Reports that she took her aspirin at home, as well as Plavix.  Her chest pain resolved completely after nitroglycerin given by EMS.  Her last catheterization was 11 years ago, and she has not been regularly seeing cardiology since then.  Past Medical History:  Diagnosis Date  . Antral ulcer    chronic atrophic gastritis with intestinal metaplasia and surface erosion  . CAD (coronary artery disease)    Pt. develpoed chest burning w/raiation to her L arm /exertion in 08. She went to ER, found to have NSTEMI, LHC showed long 75-80% proximal to mid LAD stenosis w/99% ostial D2 stenosis and diffuse mild to moderate RCA disease. She had 2 Cypher stents to the proximal to mid LAD. Echo (5/11): EF 60-65%, grade 1 diastolic dysfunction, normal wall motion, no significant valvular abnormalities.   . Glaucoma   . H/O: hysterectomy   . HTN (hypertension)   . Hyperlipidemia   . Iron deficiency anemia    with normal colonoscopy last year per her report.   . Osteoarthritis of hip   . S/P subtotal thyroidectomy     Patient Active Problem List   Diagnosis Date Noted  . Chest pain 12/25/2017  . Non-ST elevation (NSTEMI) myocardial infarction (HCC)   .  Edema of lower extremity 08/05/2011  . Ulcer, peptic, acute or chronic 08/03/2011  . Anemia 08/01/2011  . Constipation 08/01/2011  . Hyperlipidemia 03/30/2011  . Essential hypertension 01/10/2010  . CORONARY ATHEROSCLEROSIS NATIVE CORONARY ARTERY 01/10/2010    Past Surgical History:  Procedure Laterality Date  . ABDOMINAL HYSTERECTOMY    . ESOPHAGOGASTRODUODENOSCOPY  08/03/2011   Procedure: ESOPHAGOGASTRODUODENOSCOPY (EGD);  Surgeon: Hart Carwin, MD;  Location: [facility];  Service: Endoscopy;  Laterality: N/A;  . EYE SURGERY     R eye cataract surgery  . THYROIDECTOMY     subtotal     OB History   None      Home Medications    Prior to Admission medications   Medication Sig Start Date End Date Taking? Authorizing Provider  acetaminophen (TYLENOL) 500 MG tablet Take 1,000 mg by mouth as needed for mild pain or headache.   Yes [provider]  aspirin 81 MG tablet Take 81 mg by mouth daily.    Yes [provider]  atorvastatin (LIPITOR) 10 MG tablet Take 10 mg by mouth daily. 10/26/17  Yes [provider]  brimonidine (ALPHAGAN) 0.2 % ophthalmic solution Place 1 drop into both eyes every 8 (eight) hours.    Yes [provider]  clopidogrel (PLAVIX) 75 MG tablet Take 1 tablet (75 mg total) by mouth daily. 08/05/11  Yes Ramiro Harvest  V, MD  DM-APAP-CPM (CORICIDIN HBP FLU PO) Take 1 tablet by mouth as needed (flu symptoms).   Yes [provider]  dorzolamide-timolol (COSOPT) 22.3-6.8 MG/ML ophthalmic solution Place 1 drop into both eyes 2 (two) times daily.    Yes [provider]  latanoprost (XALATAN) 0.005 % ophthalmic solution Place 1 drop into both eyes Daily. 02/27/11  Yes [provider]  losartan-hydrochlorothiazide (HYZAAR) 100-12.5 MG tablet Take 1 tablet by mouth daily. 10/26/17  Yes [provider]  metoprolol tartrate (LOPRESSOR) 25 MG tablet Take 25 mg by mouth daily.    Yes [provider]  Sennosides (SENNA-EX PO) Take 1 tablet by mouth daily as needed (constipation).   Yes [provider]  docusate sodium (COLACE) 100 MG capsule 200mg  2 times daily Patient not taking: Reported on 12/25/2017 08/05/11   Rodolph Bong, MD  olmesartan (BENICAR) 40 MG tablet Take 1 tablet (40 mg total) by mouth daily. 08/05/11 08/04/12  Rodolph Bong, MD  pantoprazole (PROTONIX) 40 MG tablet Take 1 tablet (40 mg total) by mouth daily at 6 (six) AM. 08/05/11 08/04/12  Rodolph Bong, MD  sucralfate (CARAFATE) 1 G tablet Take 1 tablet (1 g total) by mouth 4 (four) times daily -  with meals and at bedtime. 08/05/11 08/04/12  Rodolph Bong, MD    Family History Family History  Problem Relation Age of Onset  . Hypertension Mother   . Lung cancer Father     Social History Social History   Tobacco Use  . Smoking status: Never Smoker  Substance Use Topics  . Alcohol use: No  . Drug use: Not on file     Allergies   Patient has no known allergies.   Review of Systems Review of Systems  Constitutional: Negative for fever.  HENT: Negative for sore throat.   Eyes: Negative for visual disturbance.  Respiratory: Negative for cough and shortness of breath.   Cardiovascular: Positive for chest pain.  Gastrointestinal: Negative for abdominal pain, nausea and vomiting.  Genitourinary: Negative for difficulty urinating.  Musculoskeletal: Negative for back pain and neck pain.  Skin: Negative for rash.  Neurological: Negative for syncope and headaches.     Physical Exam Updated Vital Signs BP (!) 166/56   Pulse (!) 46   Temp 98 F (36.7 C) (Oral)   Resp 15   Ht 5\' 3"  (1.6 m)   Wt 58.1 kg (128 lb)   SpO2 100%   BMI 22.67 kg/m   Physical Exam  Constitutional: She is oriented to person, place, and time. She appears well-developed and well-nourished. No distress.  HENT:  Head: Normocephalic and atraumatic.  Eyes: Conjunctivae and EOM are  normal.  Neck: Normal range of motion.  Cardiovascular: Normal rate, regular rhythm, normal heart sounds and intact distal pulses. Exam reveals no gallop and no friction rub.  No murmur heard. Pulmonary/Chest: Effort normal and breath sounds normal. No respiratory distress. She has no wheezes. She has no rales.  Abdominal: Soft. She exhibits no distension. There is no tenderness. There is no guarding.  Musculoskeletal: She exhibits no edema or tenderness.  Neurological: She is alert and oriented to person, place, and time.  Skin: Skin is warm and dry. No rash noted. She is not diaphoretic. No erythema.  Nursing note and vitals reviewed.    ED Treatments / Results  Labs (all labs ordered are listed, but only abnormal results are displayed) Labs Reviewed  BASIC METABOLIC PANEL - Abnormal; Notable for  the following components:      Result Value   Glucose, Bld 125 (*)    BUN 32 (*)    Calcium 11.0 (*)    GFR calc non Af Amer 53 (*)    All other components within normal limits  CBC - Abnormal; Notable for the following components:   RBC 3.79 (*)    All other components within normal limits  TROPONIN I - Abnormal; Notable for the following components:   Troponin I 0.29 (*)    All other components within normal limits  TROPONIN I  TROPONIN I  LIPID PANEL  I-STAT TROPONIN, ED    EKG EKG Interpretation  Date/Time:  Friday December 25 2017 10:24:44 EDT Ventricular Rate:  54 PR Interval:    QRS Duration: 106 QT Interval:  407 QTC Calculation: 386 R Axis:   -53 Text Interpretation:  Sinus rhythm Atrial premature complex Probable left atrial enlargement Incomplete RBBB and LAFB No significant change since last tracing Confirmed by Alvira MondaySchlossman, Malkia Nippert ([id number]) on 12/25/2017 11:10:00 AM   Radiology Dg Chest 2 View  Result Date: 12/25/2017 CLINICAL DATA:  Shortness of breath.  Chest pain. EXAM: CHEST - 2 VIEW COMPARISON:  No recent. FINDINGS: Mediastinum and hilar structures normal.  Cardiomegaly with normal pulmonary vascularity. No focal infiltrate. No pleural effusion or pneumothorax. Degenerative changes thoracic spine. Thoracic spine scoliosis. IMPRESSION: No acute cardiopulmonary disease. Electronically Signed   By: Maisie Fushomas  Register   On: 12/25/2017 12:53    Procedures .Critical Care Performed by: Alvira MondaySchlossman, Yader Criger, MD Authorized by: Alvira MondaySchlossman, Vennesa Bastedo, MD   Critical care provider statement:    Critical care time (minutes):  30   Critical care was time spent personally by me on the following activities:  Obtaining history from patient or surrogate, ordering and review of laboratory studies, ordering and review of radiographic studies, pulse oximetry and review of old charts   (including critical care time)  Medications Ordered in ED Medications  aspirin EC tablet 81 mg (81 mg Oral Not Given 12/25/17 1713)  atorvastatin (LIPITOR) tablet 10 mg (has no administration in time range)  brimonidine (ALPHAGAN) 0.2 % ophthalmic solution 1 drop (has no administration in time range)  clopidogrel (PLAVIX) tablet 75 mg (has no administration in time range)  dorzolamide-timolol (COSOPT) 22.3-6.8 MG/ML ophthalmic solution 1 drop (has no administration in time range)  latanoprost (XALATAN) 0.005 % ophthalmic solution 1 drop (has no administration in time range)  pantoprazole (PROTONIX) EC tablet 40 mg (has no administration in time range)  sucralfate (CARAFATE) tablet 1 g (has no administration in time range)  ondansetron (ZOFRAN) injection 4 mg (has no administration in time range)  enoxaparin (LOVENOX) injection 40 mg (has no administration in time range)  morphine 2 MG/ML injection 2 mg (has no administration in time range)  gi cocktail (Maalox,Lidocaine,Donnatal) (has no administration in time range)  sodium chloride flush (NS) 0.9 % injection 3 mL (has no administration in time range)  sodium chloride flush (NS) 0.9 % injection 3 mL (has no administration in time range)  0.9  %  sodium chloride infusion (has no administration in time range)  0.9 %  sodium chloride infusion ( Intravenous New Bag/Given 12/25/17 1712)  acetaminophen (TYLENOL) tablet 650 mg (has no administration in time range)  heparin injection 5,000 Units (has no administration in time range)  0.9% sodium chloride infusion (1 mL/kg/hr  58.1 kg Intravenous Not Given 12/25/17 1712)  sodium chloride flush (NS) 0.9 % injection 3 mL (has no administration in  time range)  sodium chloride flush (NS) 0.9 % injection 3 mL (has no administration in time range)  0.9 %  sodium chloride infusion (has no administration in time range)  losartan (COZAAR) tablet 100 mg (has no administration in time range)    And  hydrochlorothiazide (MICROZIDE) capsule 12.5 mg (has no administration in time range)  heparin bolus via infusion 3,500 Units (3,500 Units Intravenous Bolus from Bag 12/25/17 1439)  heparin infusion 2 units/mL in 0.9 % sodium chloride (500 mLs Other New Bag/Given 12/25/17 1621)  heparin infusion 2 units/mL in 0.9 % sodium chloride (500 mLs Other New Bag/Given 12/25/17 1621)     Initial Impression / Assessment and Plan / ED Course  I have reviewed the triage vital signs and the nursing notes.  Pertinent labs & imaging results that were available during my care of the patient were reviewed by me and considered in my medical decision making (see chart for details).     84 year old female with a history of coronary artery disease, hypertension, hyperlipidemia presents with concern for chest pain that began suddenly at 8 AM.  EKG shows no significant changes in comparison to prior, no signs of pericarditis.  Chest x-ray shows no signs of pneumonia, pneumothorax, or pulmonary edema.  Patient has a history of coronary artery disease, with typical sounding symptoms, complete resolution of her pain following nitroglycerin.  Given history and exam, have low suspicion for aortic dissection, pulmonary embolus, and  suspect most likely etiology of her pain is cardiac in nature.  Initial troponin is 0.07.  Patient had received aspirin at home and with EMS, remains chest pain-free at this time.  Consulted hospitalist for admission, and ordered additional troponin.  Additional troponin returned at 0.29, and started patient on a heparin drip.  Hospitalist notified of change and consulted Cardiology.   Final Clinical Impressions(s) / ED Diagnoses   Final diagnoses:  NSTEMI (non-ST elevated myocardial infarction) [facility])    ED Discharge Orders    None       Alvira Monday, MD 12/25/17 1836

## 2017-12-25 NOTE — ED Notes (Signed)
Attempted report x 2 

## 2017-12-26 DIAGNOSIS — I10 Essential (primary) hypertension: Secondary | ICD-10-CM

## 2017-12-26 DIAGNOSIS — I452 Bifascicular block: Secondary | ICD-10-CM | POA: Diagnosis present

## 2017-12-26 DIAGNOSIS — I252 Old myocardial infarction: Secondary | ICD-10-CM | POA: Diagnosis not present

## 2017-12-26 DIAGNOSIS — Z7982 Long term (current) use of aspirin: Secondary | ICD-10-CM | POA: Diagnosis not present

## 2017-12-26 DIAGNOSIS — E782 Mixed hyperlipidemia: Secondary | ICD-10-CM | POA: Diagnosis present

## 2017-12-26 DIAGNOSIS — Z7902 Long term (current) use of antithrombotics/antiplatelets: Secondary | ICD-10-CM | POA: Diagnosis not present

## 2017-12-26 DIAGNOSIS — Z955 Presence of coronary angioplasty implant and graft: Secondary | ICD-10-CM | POA: Diagnosis not present

## 2017-12-26 DIAGNOSIS — I25119 Atherosclerotic heart disease of native coronary artery with unspecified angina pectoris: Secondary | ICD-10-CM | POA: Diagnosis present

## 2017-12-26 DIAGNOSIS — R001 Bradycardia, unspecified: Secondary | ICD-10-CM | POA: Diagnosis present

## 2017-12-26 DIAGNOSIS — I214 Non-ST elevation (NSTEMI) myocardial infarction: Secondary | ICD-10-CM | POA: Diagnosis present

## 2017-12-26 DIAGNOSIS — I491 Atrial premature depolarization: Secondary | ICD-10-CM | POA: Diagnosis present

## 2017-12-26 DIAGNOSIS — R079 Chest pain, unspecified: Secondary | ICD-10-CM | POA: Diagnosis not present

## 2017-12-26 DIAGNOSIS — Z79899 Other long term (current) drug therapy: Secondary | ICD-10-CM | POA: Diagnosis not present

## 2017-12-26 DIAGNOSIS — H409 Unspecified glaucoma: Secondary | ICD-10-CM | POA: Diagnosis present

## 2017-12-26 DIAGNOSIS — Z66 Do not resuscitate: Secondary | ICD-10-CM | POA: Diagnosis present

## 2017-12-26 LAB — LIPID PANEL
Cholesterol: 121 mg/dL (ref 0–200)
HDL: 48 mg/dL (ref >40)
LDL CALC: 59 mg/dL (ref 0–99)
TRIGLYCERIDES: 68 mg/dL (ref <150)
Total CHOL/HDL Ratio: 2.5 RATIO
VLDL: 14 mg/dL (ref 0–40)

## 2017-12-26 LAB — TROPONIN I: Troponin I: 2.17 ng/mL (ref <0.03)

## 2017-12-26 MED ORDER — AMLODIPINE BESYLATE 2.5 MG PO TABS
2.5000 mg | ORAL_TABLET | Freq: Every day | ORAL | Status: DC
  Administered 2017-12-26 – 2017-12-27 (×2): 2.5 mg via ORAL
  Filled 2017-12-26 (×2): qty 1

## 2017-12-26 NOTE — Plan of Care (Signed)
  Problem: Pain Managment: Goal: General experience of comfort will improve Outcome: Completed/Met

## 2017-12-26 NOTE — Progress Notes (Addendum)
Patient ID: Bonnie Morton, female   DOB: 05/03/1928, 84 y.o.   MRN: [id number]  PROGRESS NOTE    Bonnie Billetovella Hoogland  OJJ:[id number] DOB: 11/14/1927 DOA: 12/25/2017  PCP: Andi DevonShelton, Kimberly, MD   Brief Narrative:  84 y.o. female with history of CAD status post previous stent intervention to the proximal mid LAD in 2008, hypertension, hyperlipidemia, and thyroid disease, admitted with NSTEMI with cardiac catheterization showing occluded distal LAD suggestive of possible embolic event and otherwise moderate RCA disease. Plan for medical therapy at this time.   Assessment & Plan:   Principal Problem:   Chest pain /NSTEMI / CAD native artery with angina pectoris - Cardiac cath done 4/19 - it showed continued patency of the stented segment in the mid LAD. Moderate diffuse RCA stenosis. Widely patent left main and left circumflex. Total occlusion of the apical LAD, consider embolic event - Continue medical therapy with aspirin and clopidogrel, secondary risk reduction measures. - May need outpt event monitor   Active Problems:   Essential hypertension - Continue Norvasc, losartan, hctz    Dyslipidemia - Continue atorvastatin   DVT prophylaxis: Heparin subQ Code Status:DNR/DNI Family Communication: no family at the bedside  Disposition Plan: home in am   Consultants:   Cardio  Procedures:   Cardiac cath 4/19  Antimicrobials:   None   Subjective: Very mild chest pain or almost no pain rather tingling.  Objective: Vitals:   12/25/17 2054 12/25/17 2054 12/26/17 0630 12/26/17 1030  BP:   (!) 160/72 118/62  Pulse: (!) 55  (!) 54   Resp: 16  18   Temp: 97.8 F (36.6 C) 97.8 F (36.6 C) 97.7 F (36.5 C)   TempSrc: Oral Oral Oral   SpO2: 100%  100%   Weight:   56.1 kg (123 lb 9.6 oz)   Height:        Intake/Output Summary (Last 24 hours) at 12/26/2017 1111 Last data filed at 12/26/2017 1000 Gross per 24 hour  Intake 1029.17 ml  Output -  Net 1029.17 ml   Filed Weights     12/25/17 1415 12/26/17 0630  Weight: 58.1 kg (128 lb) 56.1 kg (123 lb 9.6 oz)    Examination:  General exam: Appears calm and comfortable  Respiratory system: Clear to auscultation. Respiratory effort normal. Cardiovascular system: S1 & S2 heard, RRR.  Gastrointestinal system: Abdomen is nondistended, soft and nontender. No organomegaly or masses felt. Normal bowel sounds heard. Central nervous system: Alert and oriented. No focal neurological deficits. Extremities: Symmetric 5 x 5 power. Skin: No rashes, lesions or ulcers Psychiatry: Judgement and insight appear normal. Mood & affect appropriate.   Data Reviewed: I have personally reviewed following labs and imaging studies  CBC: Recent Labs  Lab 12/25/17 1036  WBC 5.7  HGB 12.3  HCT 37.1  MCV 97.9  PLT 256   Basic Metabolic Panel: Recent Labs  Lab 12/25/17 1036  NA 140  K 3.5  CL 105  CO2 23  GLUCOSE 125*  BUN 32*  CREATININE 0.94  CALCIUM 11.0*   GFR: Estimated Creatinine Clearance: 34.9 mL/min (by C-G formula based on SCr of 0.94 mg/dL). Liver Function Tests: No results for input(s): AST, ALT, ALKPHOS, BILITOT, PROT, ALBUMIN in the last 168 hours. No results for input(s): LIPASE, AMYLASE in the last 168 hours. No results for input(s): AMMONIA in the last 168 hours. Coagulation Profile: No results for input(s): INR, PROTIME in the last 168 hours. Cardiac Enzymes: Recent Labs  Lab 12/25/17 1246 12/25/17 1817  12/26/17 0043  TROPONINI 0.29* 1.23* 2.17*   BNP (last 3 results) No results for input(s): PROBNP in the last 8760 hours. HbA1C: No results for input(s): HGBA1C in the last 72 hours. CBG: No results for input(s): GLUCAP in the last 168 hours. Lipid Profile: Recent Labs    12/26/17 0043  CHOL 121  HDL 48  LDLCALC 59  TRIG 68  CHOLHDL 2.5   Thyroid Function Tests: No results for input(s): TSH, T4TOTAL, FREET4, T3FREE, THYROIDAB in the last 72 hours. Anemia Panel: No results for  input(s): VITAMINB12, FOLATE, FERRITIN, TIBC, IRON, RETICCTPCT in the last 72 hours. Urine analysis:    Component Value Date/Time   COLORURINE YELLOW 08/02/2011 1420   APPEARANCEUR CLEAR 08/02/2011 1420   LABSPEC 1.008 08/02/2011 1420   PHURINE 7.5 08/02/2011 1420   GLUCOSEU NEGATIVE 08/02/2011 1420   HGBUR NEGATIVE 08/02/2011 1420   BILIRUBINUR NEGATIVE 08/02/2011 1420   KETONESUR NEGATIVE 08/02/2011 1420   PROTEINUR NEGATIVE 08/02/2011 1420   UROBILINOGEN 0.2 08/02/2011 1420   NITRITE NEGATIVE 08/02/2011 1420   LEUKOCYTESUR NEGATIVE 08/02/2011 1420   Sepsis Labs: @LABRCNTIP (procalcitonin:4,lacticidven:4)   )No results found for this or any previous visit (from the past 240 hour(s)).    Radiology Studies: Dg Chest 2 View Result Date: 12/25/2017 No acute cardiopulmonary disease.    Scheduled Meds: . amLODipine  2.5 mg Oral Daily  . aspirin EC  81 mg Oral Daily  . atorvastatin  10 mg Oral q1800  . clopidogrel  75 mg Oral Daily  . heparin  5,000 Units Subcutaneous Q8H  . losartan  100 mg Oral Daily  . hydrochlorothiazide  12.5 mg Oral Daily  . pantoprazole  40 mg Oral Q0600  . sucralfate  1 g Oral TID WC & HS   Continuous Infusions: . sodium chloride 50 mL/hr at 12/26/17 0147     LOS: 0 days    Time spent: 25 minutes  Greater than 50% of the time spent on counseling and coordinating the care.   Manson Passey, MD [facility] Pager [phone]  If 7PM-7AM, please contact night-coverage [email or web address] Password [email or web address] 12/26/2017, 11:11 AM

## 2017-12-26 NOTE — Plan of Care (Signed)
  Problem: Coping: Goal: Level of anxiety will decrease Outcome: Progressing Pt is calm and relaxed.

## 2017-12-26 NOTE — Progress Notes (Signed)
Progress Note  Patient Name: Bonnie Morton Date of Encounter: 12/26/2017  Primary Cardiologist: Dr. Tobias Alexander  Subjective   No chest pain or shortness of breath at rest.  No palpitations or abdominal pain.  Inpatient Medications    Scheduled Meds: . aspirin EC  81 mg Oral Daily  . atorvastatin  10 mg Oral q1800  . brimonidine  1 drop Both Eyes Q8H  . clopidogrel  75 mg Oral Daily  . dorzolamide-timolol  1 drop Both Eyes BID  . heparin  5,000 Units Subcutaneous Q8H  . losartan  100 mg Oral Daily   And  . hydrochlorothiazide  12.5 mg Oral Daily  . latanoprost  1 drop Both Eyes QHS  . pantoprazole  40 mg Oral Q0600  . sodium chloride flush  3 mL Intravenous Q12H  . sodium chloride flush  3 mL Intravenous Q12H  . sucralfate  1 g Oral TID WC & HS   Continuous Infusions: . sodium chloride    . sodium chloride 50 mL/hr at 12/26/17 0147  . sodium chloride     PRN Meds: sodium chloride, sodium chloride, acetaminophen, gi cocktail, morphine injection, ondansetron (ZOFRAN) IV, sodium chloride flush, sodium chloride flush   Vital Signs    Vitals:   12/25/17 2038 12/25/17 2054 12/25/17 2054 12/26/17 0630  BP: 140/62   (!) 160/72  Pulse:  (!) 55  (!) 54  Resp:  16  18  Temp:  97.8 F (36.6 C) 97.8 F (36.6 C) 97.7 F (36.5 C)  TempSrc:  Oral Oral Oral  SpO2:  100%  100%  Weight:    123 lb 9.6 oz (56.1 kg)  Height:        Intake/Output Summary (Last 24 hours) at 12/26/2017 0802 Last data filed at 12/26/2017 0147 Gross per 24 hour  Intake 789.17 ml  Output -  Net 789.17 ml   Filed Weights   12/25/17 1415 12/26/17 0630  Weight: 128 lb (58.1 kg) 123 lb 9.6 oz (56.1 kg)    Telemetry    Sinus rhythm and sinus bradycardia.  Personally reviewed.  ECG    Sinus bradycardia with leftward axis, IVCD, T wave abnormalities suggestive of anterolateral ischemia.  Personally reviewed.  Physical Exam   GEN:  Elderly woman.  No acute distress.   Neck: No  JVD. Cardiac: RRR, soft systolic murmur, no gallop.  Respiratory: Nonlabored. Clear to auscultation bilaterally. GI: Soft, nontender, bowel sounds present. MS: No edema; No deformity. Neuro:  Nonfocal. Psych: Alert and oriented x 3. Normal affect.  Labs    Chemistry Recent Labs  Lab 12/25/17 1036  NA 140  K 3.5  CL 105  CO2 23  GLUCOSE 125*  BUN 32*  CREATININE 0.94  CALCIUM 11.0*  GFRNONAA 53*  GFRAA >60  ANIONGAP 12     Hematology Recent Labs  Lab 12/25/17 1036  WBC 5.7  RBC 3.79*  HGB 12.3  HCT 37.1  MCV 97.9  MCH 32.5  MCHC 33.2  RDW 13.3  PLT 256    Cardiac Enzymes Recent Labs  Lab 12/25/17 1246 12/25/17 1817 12/26/17 0043  TROPONINI 0.29* 1.23* 2.17*    Recent Labs  Lab 12/25/17 1040  TROPIPOC 0.07     Radiology    Dg Chest 2 View  Result Date: 12/25/2017 CLINICAL DATA:  Shortness of breath.  Chest pain. EXAM: CHEST - 2 VIEW COMPARISON:  No recent. FINDINGS: Mediastinum and hilar structures normal. Cardiomegaly with normal pulmonary vascularity. No focal infiltrate. No  pleural effusion or pneumothorax. Degenerative changes thoracic spine. Thoracic spine scoliosis. IMPRESSION: No acute cardiopulmonary disease. Electronically Signed   By: Maisie Fushomas  Register   On: 12/25/2017 12:53    Cardiac Studies   Cardiac catheterization 12/25/2017:  Dist LAD lesion is 100% stenosed.  Prox LAD to Mid LAD lesion is 25% stenosed.  Prox LAD lesion is 30% stenosed.  Prox RCA to Mid RCA lesion is 60% stenosed.  The left ventricular ejection fraction is greater than 65% by visual estimate.  LV end diastolic pressure is normal.   1.  Continued patency of the stented segment in the mid LAD 2.  Moderate diffuse RCA stenosis involving a relatively small caliber vessel with stable appearance compared to the 2008 catheterization study 3.  Widely patent left main and left circumflex 4.  Total occlusion of the apical LAD, consider embolic  event  Recommendations: Continue medical therapy with aspirin and clopidogrel, secondary risk reduction measures.  Monitor on telemetry and consider an outpatient event monitor to exclude atrial fibrillation/coronary embolic event.  Patient Profile     84 y.o. female with history of CAD status post previous stent intervention to the proximal mid LAD in 2008, hypertension, hyperlipidemia, and thyroid disease.  She is now admitted with NSTEMI with cardiac catheterization showing occluded distal LAD suggestive of possible embolic event and otherwise moderate RCA disease.  Plan is for medical therapy at this time.  No evidence of atrial arrhythmias.  Assessment & Plan    1.  NSTEMI, troponin I up to 2.17.  Currently chest pain free.  2.  CAD with previous stent intervention to the proximal and mid LAD, patent by cardiac catheterization but now with evidence of distal LAD occlusion suggestive of possible embolic event and moderate RCA disease.  Plan is medical therapy at this time.  3.  Essential hypertension, blood pressure control not optimal.  4.  Mixed hyperlipidemia, current LDL 59.  She is on Lipitor.  Continue aspirin, Plavix, Lipitor, losartan, and HCTZ.  Add Norvasc 2.5 mg daily.  Would ambulate today, follow telemetry, possible discharge within the next 24 hours.  If no atrial arrhythmias documented during hospital stay, can consider further outpatient monitoring at follow-up.  Signed, Bonnie DellSamuel Pearse Shiffler, MD  12/26/2017, 8:02 AM

## 2017-12-27 ENCOUNTER — Encounter (HOSPITAL_COMMUNITY): Payer: Self-pay | Admitting: Nurse Practitioner

## 2017-12-27 LAB — BASIC METABOLIC PANEL
ANION GAP: 10 (ref 5–15)
BUN: 21 mg/dL — AB (ref 6–20)
CHLORIDE: 102 mmol/L (ref 101–111)
CO2: 26 mmol/L (ref 22–32)
Calcium: 10.1 mg/dL (ref 8.9–10.3)
Creatinine, Ser: 0.78 mg/dL (ref 0.44–1.00)
GFR calc Af Amer: >60 mL/min (ref >60)
GFR calc non Af Amer: >60 mL/min (ref >60)
Glucose, Bld: 100 mg/dL — ABNORMAL HIGH (ref 65–99)
POTASSIUM: 3.5 mmol/L (ref 3.5–5.1)
SODIUM: 138 mmol/L (ref 135–145)

## 2017-12-27 MED ORDER — AMLODIPINE BESYLATE 2.5 MG PO TABS: 2.5000 mg | ORAL_TABLET | Freq: Every day | ORAL | 0 refills | Status: DC

## 2017-12-27 MED ORDER — CLOPIDOGREL BISULFATE 75 MG PO TABS: 75.0000 mg | ORAL_TABLET | Freq: Every day | ORAL | 0 refills | Status: DC

## 2017-12-27 MED ORDER — CLOPIDOGREL BISULFATE 75 MG PO TABS: 75.0000 mg | ORAL_TABLET | Freq: Every day | ORAL | 0 refills | Status: AC

## 2017-12-27 NOTE — Discharge Instructions (Addendum)
Amlodipine tablets °What is this medicine? °AMLODIPINE (am LOE di peen) is a calcium-channel blocker. It affects the amount of calcium found in your heart and muscle cells. This relaxes your blood vessels, which can reduce the amount of work the heart has to do. This medicine is used to lower high blood pressure. It is also used to prevent chest pain. °This medicine may be used for other purposes; ask your health care provider or pharmacist if you have questions. °COMMON BRAND NAME(S): Norvasc °What should I tell my health care provider before I take this medicine? °They need to know if you have any of these conditions: °-heart problems like heart failure or aortic stenosis °-liver disease °-an unusual or allergic reaction to amlodipine, other medicines, foods, dyes, or preservatives °-pregnant or trying to get pregnant °-breast-feeding °How should I use this medicine? °Take this medicine by mouth with a glass of water. Follow the directions on the prescription label. Take your medicine at regular intervals. Do not take more medicine than directed. °Talk to your pediatrician regarding the use of this medicine in children. Special care may be needed. This medicine has been used in children as young as 6. °Persons over 65 years old may have a stronger reaction to this medicine and need smaller doses. °Overdosage: If you think you have taken too much of this medicine contact a poison control center or emergency room at once. °NOTE: This medicine is only for you. Do not share this medicine with others. °What if I miss a dose? °If you miss a dose, take it as soon as you can. If it is almost time for your next dose, take only that dose. Do not take double or extra doses. °What may interact with this medicine? °-herbal or dietary supplements °-local or general anesthetics °-medicines for high blood pressure °-medicines for prostate problems °-rifampin °This list may not describe all possible interactions. Give your health  care provider a list of all the medicines, herbs, non-prescription drugs, or dietary supplements you use. Also tell them if you smoke, drink alcohol, or use illegal drugs. Some items may interact with your medicine. °What should I watch for while using this medicine? °Visit your doctor or health care professional for regular check ups. Check your blood pressure and pulse rate regularly. Ask your health care professional what your blood pressure and pulse rate should be, and when you should contact him or her. °This medicine may make you feel confused, dizzy or lightheaded. Do not drive, use machinery, or do anything that needs mental alertness until you know how this medicine affects you. To reduce the risk of dizzy or fainting spells, do not sit or stand up quickly, especially if you are an older patient. Avoid alcoholic drinks; they can make you more dizzy. °Do not suddenly stop taking amlodipine. Ask your doctor or health care professional how you can gradually reduce the dose. °What side effects may I notice from receiving this medicine? °Side effects that you should report to your doctor or health care professional as soon as possible: °-allergic reactions like skin rash, itching or hives, swelling of the face, lips, or tongue °-breathing problems °-changes in vision or hearing °-chest pain °-fast, irregular heartbeat °-swelling of legs or ankles °Side effects that usually do not require medical attention (report to your doctor or health care professional if they continue or are bothersome): °-dry mouth °-facial flushing °-nausea, vomiting °-stomach gas, pain °-tired, weak °-trouble sleeping °This list may not describe all possible side   effects. Call your doctor for medical advice about side effects. You may report side effects to FDA at 1-800-FDA-1088. °Where should I keep my medicine? °Keep out of the reach of children. °Store at room temperature between 59 and 86 degrees F (15 and 30 degrees C). Protect from  light. Keep container tightly closed. Throw away any unused medicine after the expiration date. °NOTE: This sheet is a summary. It may not cover all possible information. If you have questions about this medicine, talk to your doctor, pharmacist, or health care provider. °© 2018 [employer] (2012-07-23 11:40:58) ° °

## 2017-12-27 NOTE — Progress Notes (Signed)
Progress Note  Patient Name: Bonnie Morton Date of Encounter: 12/27/2017  Primary Cardiologist: Dr. Tobias Alexander  Subjective   Feels better.  No recurrent chest pain or shortness of breath.  No palpitations.  Inpatient Medications    Scheduled Meds: . amLODipine  2.5 mg Oral Daily  . aspirin EC  81 mg Oral Daily  . atorvastatin  10 mg Oral q1800  . brimonidine  1 drop Both Eyes Q8H  . clopidogrel  75 mg Oral Daily  . dorzolamide-timolol  1 drop Both Eyes BID  . heparin  5,000 Units Subcutaneous Q8H  . losartan  100 mg Oral Daily   And  . hydrochlorothiazide  12.5 mg Oral Daily  . latanoprost  1 drop Both Eyes QHS  . pantoprazole  40 mg Oral Q0600  . sodium chloride flush  3 mL Intravenous Q12H  . sodium chloride flush  3 mL Intravenous Q12H  . sucralfate  1 g Oral TID WC & HS   Continuous Infusions: . sodium chloride    . sodium chloride 50 mL/hr at 12/26/17 0147  . sodium chloride     PRN Meds: sodium chloride, sodium chloride, acetaminophen, gi cocktail, morphine injection, ondansetron (ZOFRAN) IV, sodium chloride flush, sodium chloride flush   Vital Signs    Vitals:   12/26/17 1358 12/26/17 2104 12/27/17 0329 12/27/17 0334  BP: (!) 150/65 (!) 143/67  (!) 152/73  Pulse: 60 (!) 58  (!) 55  Resp:  18  18  Temp: 97.6 F (36.4 C) 98.1 F (36.7 C)  97.8 F (36.6 C)  TempSrc: Oral Oral  Oral  SpO2: 100% 99%  100%  Weight:   123 lb 8 oz (56 kg)   Height:        Intake/Output Summary (Last 24 hours) at 12/27/2017 0744 Last data filed at 12/27/2017 0330 Gross per 24 hour  Intake 730 ml  Output 600 ml  Net 130 ml   Filed Weights   12/25/17 1415 12/26/17 0630 12/27/17 0329  Weight: 128 lb (58.1 kg) 123 lb 9.6 oz (56.1 kg) 123 lb 8 oz (56 kg)    Telemetry    Sinus rhythm and sinus bradycardia.  Personally reviewed.  ECG    Sinus bradycardia with leftward axis and anterolateral ST-T wave abnormalities consistent with ischemic heart disease.   Personally reviewed.  Physical Exam   GEN:  Elderly woman.  No acute distress.   Neck: No JVD. Cardiac: RRR, soft systolic murmur, no gallop.  Respiratory: Nonlabored. Clear to auscultation bilaterally. GI: Soft, nontender, bowel sounds present. MS: No edema; No deformity. Neuro:  Nonfocal. Psych: Alert and oriented x 3. Normal affect.  Labs    Chemistry Recent Labs  Lab 12/25/17 1036 12/27/17 0402  NA 140 138  K 3.5 3.5  CL 105 102  CO2 23 26  GLUCOSE 125* 100*  BUN 32* 21*  CREATININE 0.94 0.78  CALCIUM 11.0* 10.1  GFRNONAA 53* >60  GFRAA >60 >60  ANIONGAP 12 10     Hematology Recent Labs  Lab 12/25/17 1036  WBC 5.7  RBC 3.79*  HGB 12.3  HCT 37.1  MCV 97.9  MCH 32.5  MCHC 33.2  RDW 13.3  PLT 256    Cardiac Enzymes Recent Labs  Lab 12/25/17 1246 12/25/17 1817 12/26/17 0043  TROPONINI 0.29* 1.23* 2.17*    Recent Labs  Lab 12/25/17 1040  TROPIPOC 0.07     Radiology    Dg Chest 2 View  Result Date: 12/25/2017  CLINICAL DATA:  Shortness of breath.  Chest pain. EXAM: CHEST - 2 VIEW COMPARISON:  No recent. FINDINGS: Mediastinum and hilar structures normal. Cardiomegaly with normal pulmonary vascularity. No focal infiltrate. No pleural effusion or pneumothorax. Degenerative changes thoracic spine. Thoracic spine scoliosis. IMPRESSION: No acute cardiopulmonary disease. Electronically Signed   By: Maisie Fushomas  Register   On: 12/25/2017 12:53    Cardiac Studies   Cardiac catheterization 12/25/2017:  Dist LAD lesion is 100% stenosed.  Prox LAD to Mid LAD lesion is 25% stenosed.  Prox LAD lesion is 30% stenosed.  Prox RCA to Mid RCA lesion is 60% stenosed.  The left ventricular ejection fraction is greater than 65% by visual estimate.  LV end diastolic pressure is normal.  1. Continued patency of the stented segment in the mid LAD 2. Moderate diffuse RCA stenosis involving a relatively small caliber vessel with stable appearance compared to the  2008 catheterization study 3. Widely patent left main and left circumflex 4. Total occlusion of the apical LAD, consider embolic event  Recommendations: Continue medical therapy with aspirin and clopidogrel, secondary risk reduction measures. Monitor on telemetry and consider an outpatient event monitor to exclude atrial fibrillation/coronary embolic event.  Patient Profile     84 y.o. female with history of CAD status post previous stent intervention to the proximal mid LAD in 2008, hypertension, hyperlipidemia, and thyroid disease.  She is now admitted with NSTEMI with cardiac catheterization showing occluded distal LAD suggestive of possible embolic event and otherwise moderate RCA disease.  Plan is for medical therapy at this time.  No evidence of atrial arrhythmias.  Assessment & Plan    1.  NSTEMI, troponin I up to 2.17.  No recurrent chest pain or shortness of breath on medical therapy.  2.  CAD with history of stent interventions to the proximal and mid LAD, found to be patent at cardiac catheterization this admission but now with evidence of distal LAD occlusion suggestive of possible embolic event as well as moderate RCA disease.  She has had no atrial arrhythmias.  Medical therapy is planned at this point.  3.  Essential hypertension.  Norvasc was added yesterday.  4.  Mixed hyperlipidemia, LDL 59.  She is on Lipitor.  Anticipate discharge home today.  Would continue aspirin, Plavix, Lipitor, losartan, HCTZ, and newly added Norvasc which could be further uptitrated as an outpatient.  She should be scheduled to follow-up with Dr. Delton SeeNelson or APP in about 7 days and can have consideration made at that time for placing an outpatient cardiac monitor to exclude any potential atrial arrhythmias.  Signed, Nona DellSamuel McDowell, MD  12/27/2017, 7:44 AM

## 2017-12-27 NOTE — Discharge Summary (Signed)
Physician Discharge Summary  Bonnie Morton WUJ:[id number] DOB: 08/25/1928 DOA: 12/25/2017  PCP: Andi DevonShelton, Kimberly, MD  Admit date: 12/25/2017 Discharge date: 12/27/2017  Recommendations for Outpatient Follow-up:  Continue aspirin, Lipitor, losartan-hctz Added Norvasc which can be titrated to higher doses based on blood pressure Follow up with Dr. Delton SeeNelson in 7 days  Discharge Diagnoses:  Principal Problem:   Chest pain Active Problems:   Essential hypertension   CORONARY ATHEROSCLEROSIS NATIVE CORONARY ARTERY   Hyperlipidemia   Non-ST elevation (NSTEMI) myocardial infarction [facility](HCC)    Discharge Condition: stable   Diet recommendation: as tolerated   History of present illness:  84 y.o.femalewith history of CAD status post previous stent intervention to the proximal mid LAD in 2008, hypertension, hyperlipidemia, and thyroid disease, admitted with NSTEMI with cardiac catheterization showing occluded distal LAD suggestive of possible embolic event and otherwise moderate RCA disease. Plan for medical therapy at this time.   Hospital Course:  Principal Problem:   Chest pain /NSTEMI / CAD native artery with angina pectoris - Cardiac cath done 4/19 - it showed continued patency of the stented segment in the mid LAD. Moderate diffuse RCA stenosis. Widely patent left main and left circumflex. Total occlusion of the apical LAD, consider embolic event - Continue medical therapy with aspirin and clopidogrel, secondary risk reduction measures. - May need outpt event monitor - Follow up with Dr. Suzie PortelaNeslon in 7 days on discharge    Active Problems:   Essential hypertension - Continue Norvasc, losartan, hctz    Dyslipidemia - Continue atorvastatin   DVT prophylaxis: Heparin subQ Code Status:DNR/DNI Family Communication: no family at the bedside     Consultants:   Cardio  Procedures:   Cardiac cath 4/19  Antimicrobials:   None      Signed:  Manson PasseyAlma Georgia Delsignore,  MD  [facility] 12/27/2017, 2:15 PM  Pager #: [phone]  Time spent in minutes: more than 30 minutes    Discharge Exam: Vitals:   12/27/17 0334 12/27/17 0924  BP: (!) 152/73 140/67  Pulse: (!) 55   Resp: 18   Temp: 97.8 F (36.6 C)   SpO2: 100%    Vitals:   12/26/17 2104 12/27/17 0329 12/27/17 0334 12/27/17 0924  BP: (!) 143/67  (!) 152/73 140/67  Pulse: (!) 58  (!) 55   Resp: 18  18   Temp: 98.1 F (36.7 C)  97.8 F (36.6 C)   TempSrc: Oral  Oral   SpO2: 99%  100%   Weight:  56 kg (123 lb 8 oz)    Height:        General: Pt is alert, follows commands appropriately, not in acute distress Cardiovascular: Regular rate and rhythm, S1/S2 + Respiratory: Clear to auscultation bilaterally, no wheezing, no crackles, no rhonchi Abdominal: Soft, non tender, non distended, bowel sounds +, no guarding Extremities: no edema, no cyanosis, pulses palpable bilaterally DP and PT Neuro: Grossly nonfocal  Discharge Instructions  Discharge Instructions    Call MD for:  persistant nausea and vomiting   Complete by:  As directed    Call MD for:  redness, tenderness, or signs of infection (pain, swelling, redness, odor or green/yellow discharge around incision site)   Complete by:  As directed    Call MD for:  severe uncontrolled pain   Complete by:  As directed    Diet - low sodium heart healthy   Complete by:  As directed    Discharge instructions   Complete by:  As directed  Continue aspirin, Lipitor, losartan-hctz Added Norvasc which can be titrated to higher doses based on blood pressure Follow up with Dr. Delton See in 7 days   Increase activity slowly   Complete by:  As directed      Allergies as of 12/27/2017   No Known Allergies     Medication List    STOP taking these medications   docusate sodium 100 MG capsule Commonly known as:  COLACE   olmesartan 40 MG tablet Commonly known as:  BENICAR     TAKE these medications   acetaminophen 500 MG  tablet Commonly known as:  TYLENOL Take 1,000 mg by mouth as needed for mild pain or headache.   amLODipine 2.5 MG tablet Commonly known as:  NORVASC Take 1 tablet (2.5 mg total) by mouth daily. Start taking on:  12/28/2017   aspirin 81 MG tablet Take 81 mg by mouth daily.   atorvastatin 10 MG tablet Commonly known as:  LIPITOR Take 10 mg by mouth daily.   brimonidine 0.2 % ophthalmic solution Commonly known as:  ALPHAGAN Place 1 drop into both eyes every 8 (eight) hours.   clopidogrel 75 MG tablet Commonly known as:  PLAVIX Take 1 tablet (75 mg total) by mouth daily.   CORICIDIN HBP FLU PO Take 1 tablet by mouth as needed (flu symptoms).   dorzolamide-timolol 22.3-6.8 MG/ML ophthalmic solution Commonly known as:  COSOPT Place 1 drop into both eyes 2 (two) times daily.   latanoprost 0.005 % ophthalmic solution Commonly known as:  XALATAN Place 1 drop into both eyes Daily.   losartan-hydrochlorothiazide 100-12.5 MG tablet Commonly known as:  HYZAAR Take 1 tablet by mouth daily.   metoprolol tartrate 25 MG tablet Commonly known as:  LOPRESSOR Take 25 mg by mouth daily.   pantoprazole 40 MG tablet Commonly known as:  PROTONIX Take 1 tablet (40 mg total) by mouth daily at 6 (six) AM.   SENNA-EX PO Take 1 tablet by mouth daily as needed (constipation).   sucralfate 1 g tablet Commonly known as:  CARAFATE Take 1 tablet (1 g total) by mouth 4 (four) times daily -  with meals and at bedtime.      Follow-up Information    Andi Devon, MD. Schedule an appointment as soon as possible for a visit in 2 week(s).   Specialty:  Internal Medicine Contact information: [address] [place] [address] [phone]        Lars Masson, MD. Schedule an appointment as soon as possible for a visit in 1 week(s).   Specialty:  Cardiology Contact information: [address] [place] [address] [phone]             The results of significant diagnostics from this hospitalization (including imaging, microbiology, ancillary and laboratory) are listed below for reference.    Significant Diagnostic Studies: Dg Chest 2 View  Result Date: 12/25/2017 CLINICAL DATA:  Shortness of breath.  Chest pain. EXAM: CHEST - 2 VIEW COMPARISON:  No recent. FINDINGS: Mediastinum and hilar structures normal. Cardiomegaly with normal pulmonary vascularity. No focal infiltrate. No pleural effusion or pneumothorax. Degenerative changes thoracic spine. Thoracic spine scoliosis. IMPRESSION: No acute cardiopulmonary disease. Electronically Signed   By: Maisie Fus  Register   On: 12/25/2017 12:53    Microbiology: No results found for this or any previous visit (from the past 240 hour(s)).   Labs: Basic Metabolic Panel: Recent Labs  Lab 12/25/17 1036 12/27/17 0402  NA 140 138  K 3.5 3.5  CL 105 102  CO2 23 26  GLUCOSE 125* 100*  BUN 32* 21*  CREATININE 0.94 0.78  CALCIUM 11.0* 10.1   Liver Function Tests: No results for input(s): AST, ALT, ALKPHOS, BILITOT, PROT, ALBUMIN in the last 168 hours. No results for input(s): LIPASE, AMYLASE in the last 168 hours. No results for input(s): AMMONIA in the last 168 hours. CBC: Recent Labs  Lab 12/25/17 1036  WBC 5.7  HGB 12.3  HCT 37.1  MCV 97.9  PLT 256   Cardiac Enzymes: Recent Labs  Lab 12/25/17 1246 12/25/17 1817 12/26/17 0043  TROPONINI 0.29* 1.23* 2.17*   BNP: BNP (last 3 results) No results for input(s): BNP in the last 8760 hours.  ProBNP (last 3 results) No results for input(s): PROBNP in the last 8760 hours.  CBG: No results for input(s): GLUCAP in the last 168 hours.

## 2017-12-28 ENCOUNTER — Encounter (HOSPITAL_COMMUNITY): Payer: Self-pay | Admitting: Cardiovascular Disease

## 2018-02-17 ENCOUNTER — Ambulatory Visit: Payer: Self-pay | Admitting: Cardiology

## 2018-03-26 DIAGNOSIS — I2510 Atherosclerotic heart disease of native coronary artery without angina pectoris: Secondary | ICD-10-CM | POA: Diagnosis present

## 2019-06-28 ENCOUNTER — Encounter (HOSPITAL_COMMUNITY): Payer: Self-pay | Admitting: Emergency Medicine

## 2019-06-28 ENCOUNTER — Observation Stay (HOSPITAL_COMMUNITY)
Admission: EM | Admit: 2019-06-28 | Discharge: 2019-06-29 | Disposition: A | Discharge status: Home / Self Care | Payer: Medicare Other | Attending: Internal Medicine | Admitting: Internal Medicine

## 2019-06-28 ENCOUNTER — Other Ambulatory Visit: Payer: Self-pay

## 2019-06-28 ENCOUNTER — Emergency Department (HOSPITAL_COMMUNITY): Payer: Medicare Other

## 2019-06-28 DIAGNOSIS — M25572 Pain in left ankle and joints of left foot: Secondary | ICD-10-CM | POA: Insufficient documentation

## 2019-06-28 DIAGNOSIS — M1610 Unilateral primary osteoarthritis, unspecified hip: Secondary | ICD-10-CM | POA: Diagnosis not present

## 2019-06-28 DIAGNOSIS — I252 Old myocardial infarction: Secondary | ICD-10-CM | POA: Diagnosis not present

## 2019-06-28 DIAGNOSIS — E860 Dehydration: Secondary | ICD-10-CM | POA: Diagnosis not present

## 2019-06-28 DIAGNOSIS — Z20828 Contact with and (suspected) exposure to other viral communicable diseases: Secondary | ICD-10-CM | POA: Diagnosis not present

## 2019-06-28 DIAGNOSIS — I110 Hypertensive heart disease with heart failure: Secondary | ICD-10-CM | POA: Diagnosis not present

## 2019-06-28 DIAGNOSIS — I959 Hypotension, unspecified: Secondary | ICD-10-CM | POA: Diagnosis present

## 2019-06-28 DIAGNOSIS — E861 Hypovolemia: Secondary | ICD-10-CM

## 2019-06-28 DIAGNOSIS — Z955 Presence of coronary angioplasty implant and graft: Secondary | ICD-10-CM | POA: Diagnosis not present

## 2019-06-28 DIAGNOSIS — I9589 Other hypotension: Secondary | ICD-10-CM | POA: Diagnosis not present

## 2019-06-28 DIAGNOSIS — Z8711 Personal history of peptic ulcer disease: Secondary | ICD-10-CM | POA: Diagnosis not present

## 2019-06-28 DIAGNOSIS — I2510 Atherosclerotic heart disease of native coronary artery without angina pectoris: Secondary | ICD-10-CM | POA: Diagnosis not present

## 2019-06-28 DIAGNOSIS — Z66 Do not resuscitate: Secondary | ICD-10-CM | POA: Diagnosis not present

## 2019-06-28 DIAGNOSIS — H409 Unspecified glaucoma: Secondary | ICD-10-CM | POA: Diagnosis not present

## 2019-06-28 DIAGNOSIS — E785 Hyperlipidemia, unspecified: Secondary | ICD-10-CM | POA: Insufficient documentation

## 2019-06-28 DIAGNOSIS — Z79899 Other long term (current) drug therapy: Secondary | ICD-10-CM | POA: Insufficient documentation

## 2019-06-28 DIAGNOSIS — Z7982 Long term (current) use of aspirin: Secondary | ICD-10-CM | POA: Insufficient documentation

## 2019-06-28 DIAGNOSIS — M79672 Pain in left foot: Secondary | ICD-10-CM | POA: Diagnosis not present

## 2019-06-28 DIAGNOSIS — R55 Syncope and collapse: Secondary | ICD-10-CM | POA: Diagnosis present

## 2019-06-28 DIAGNOSIS — Z8249 Family history of ischemic heart disease and other diseases of the circulatory system: Secondary | ICD-10-CM | POA: Diagnosis not present

## 2019-06-28 DIAGNOSIS — I25119 Atherosclerotic heart disease of native coronary artery with unspecified angina pectoris: Secondary | ICD-10-CM | POA: Diagnosis not present

## 2019-06-28 DIAGNOSIS — Z7902 Long term (current) use of antithrombotics/antiplatelets: Secondary | ICD-10-CM | POA: Insufficient documentation

## 2019-06-28 DIAGNOSIS — I5032 Chronic diastolic (congestive) heart failure: Secondary | ICD-10-CM | POA: Insufficient documentation

## 2019-06-28 LAB — CBC WITH DIFFERENTIAL/PLATELET
Abs Immature Granulocytes: 0.03 10*3/uL (ref 0.00–0.07)
Basophils Absolute: 0.1 10*3/uL (ref 0.0–0.1)
Basophils Relative: 1 %
Eosinophils Absolute: 0.1 10*3/uL (ref 0.0–0.5)
Eosinophils Relative: 2 %
HCT: 34.2 % — ABNORMAL LOW (ref 36.0–46.0)
Hemoglobin: 10.7 g/dL — ABNORMAL LOW (ref 12.0–15.0)
Immature Granulocytes: 0 %
Lymphocytes Relative: 12 %
Lymphs Abs: 1 10*3/uL (ref 0.7–4.0)
MCH: 32.5 pg (ref 26.0–34.0)
MCHC: 31.3 g/dL (ref 30.0–36.0)
MCV: 104 fL — ABNORMAL HIGH (ref 80.0–100.0)
Monocytes Absolute: 0.5 10*3/uL (ref 0.1–1.0)
Monocytes Relative: 6 %
Neutro Abs: 6.8 10*3/uL (ref 1.7–7.7)
Neutrophils Relative %: 79 %
Platelets: 235 10*3/uL (ref 150–400)
RBC: 3.29 MIL/uL — ABNORMAL LOW (ref 3.87–5.11)
RDW: 12.6 % (ref 11.5–15.5)
WBC: 8.5 10*3/uL (ref 4.0–10.5)
nRBC: 0 % (ref 0.0–0.2)

## 2019-06-28 LAB — COMPREHENSIVE METABOLIC PANEL
ALT: 18 U/L (ref 0–44)
AST: 31 U/L (ref 15–41)
Albumin: 3.4 g/dL — ABNORMAL LOW (ref 3.5–5.0)
Alkaline Phosphatase: 63 U/L (ref 38–126)
Anion gap: 13 (ref 5–15)
BUN: 47 mg/dL — ABNORMAL HIGH (ref 8–23)
CO2: 20 mmol/L — ABNORMAL LOW (ref 22–32)
Calcium: 10.3 mg/dL (ref 8.9–10.3)
Chloride: 106 mmol/L (ref 98–111)
Creatinine, Ser: 1.04 mg/dL — ABNORMAL HIGH (ref 0.44–1.00)
GFR calc Af Amer: 55 mL/min — ABNORMAL LOW (ref >60)
GFR calc non Af Amer: 48 mL/min — ABNORMAL LOW (ref >60)
Glucose, Bld: 169 mg/dL — ABNORMAL HIGH (ref 70–99)
Potassium: 4.1 mmol/L (ref 3.5–5.1)
Sodium: 139 mmol/L (ref 135–145)
Total Bilirubin: 1 mg/dL (ref 0.3–1.2)
Total Protein: 7.5 g/dL (ref 6.5–8.1)

## 2019-06-28 LAB — TROPONIN I (HIGH SENSITIVITY)
Troponin I (High Sensitivity): 36 ng/L — ABNORMAL HIGH (ref <18)
Troponin I (High Sensitivity): 37 ng/L — ABNORMAL HIGH (ref <18)

## 2019-06-28 LAB — SARS CORONAVIRUS 2 (TAT 6-24 HRS): SARS Coronavirus 2: NEGATIVE

## 2019-06-28 LAB — TSH: TSH: 1.558 u[IU]/mL (ref 0.350–4.500)

## 2019-06-28 IMAGING — DX DG CHEST 2V
2 series · 2 of 2 positions shown · non-contrast
Comparison: 12/25/2017 and 08/01/2011

CLINICAL DATA: Weakness and hypotension.

EXAM:
CHEST - 2 VIEW

[chest lat]
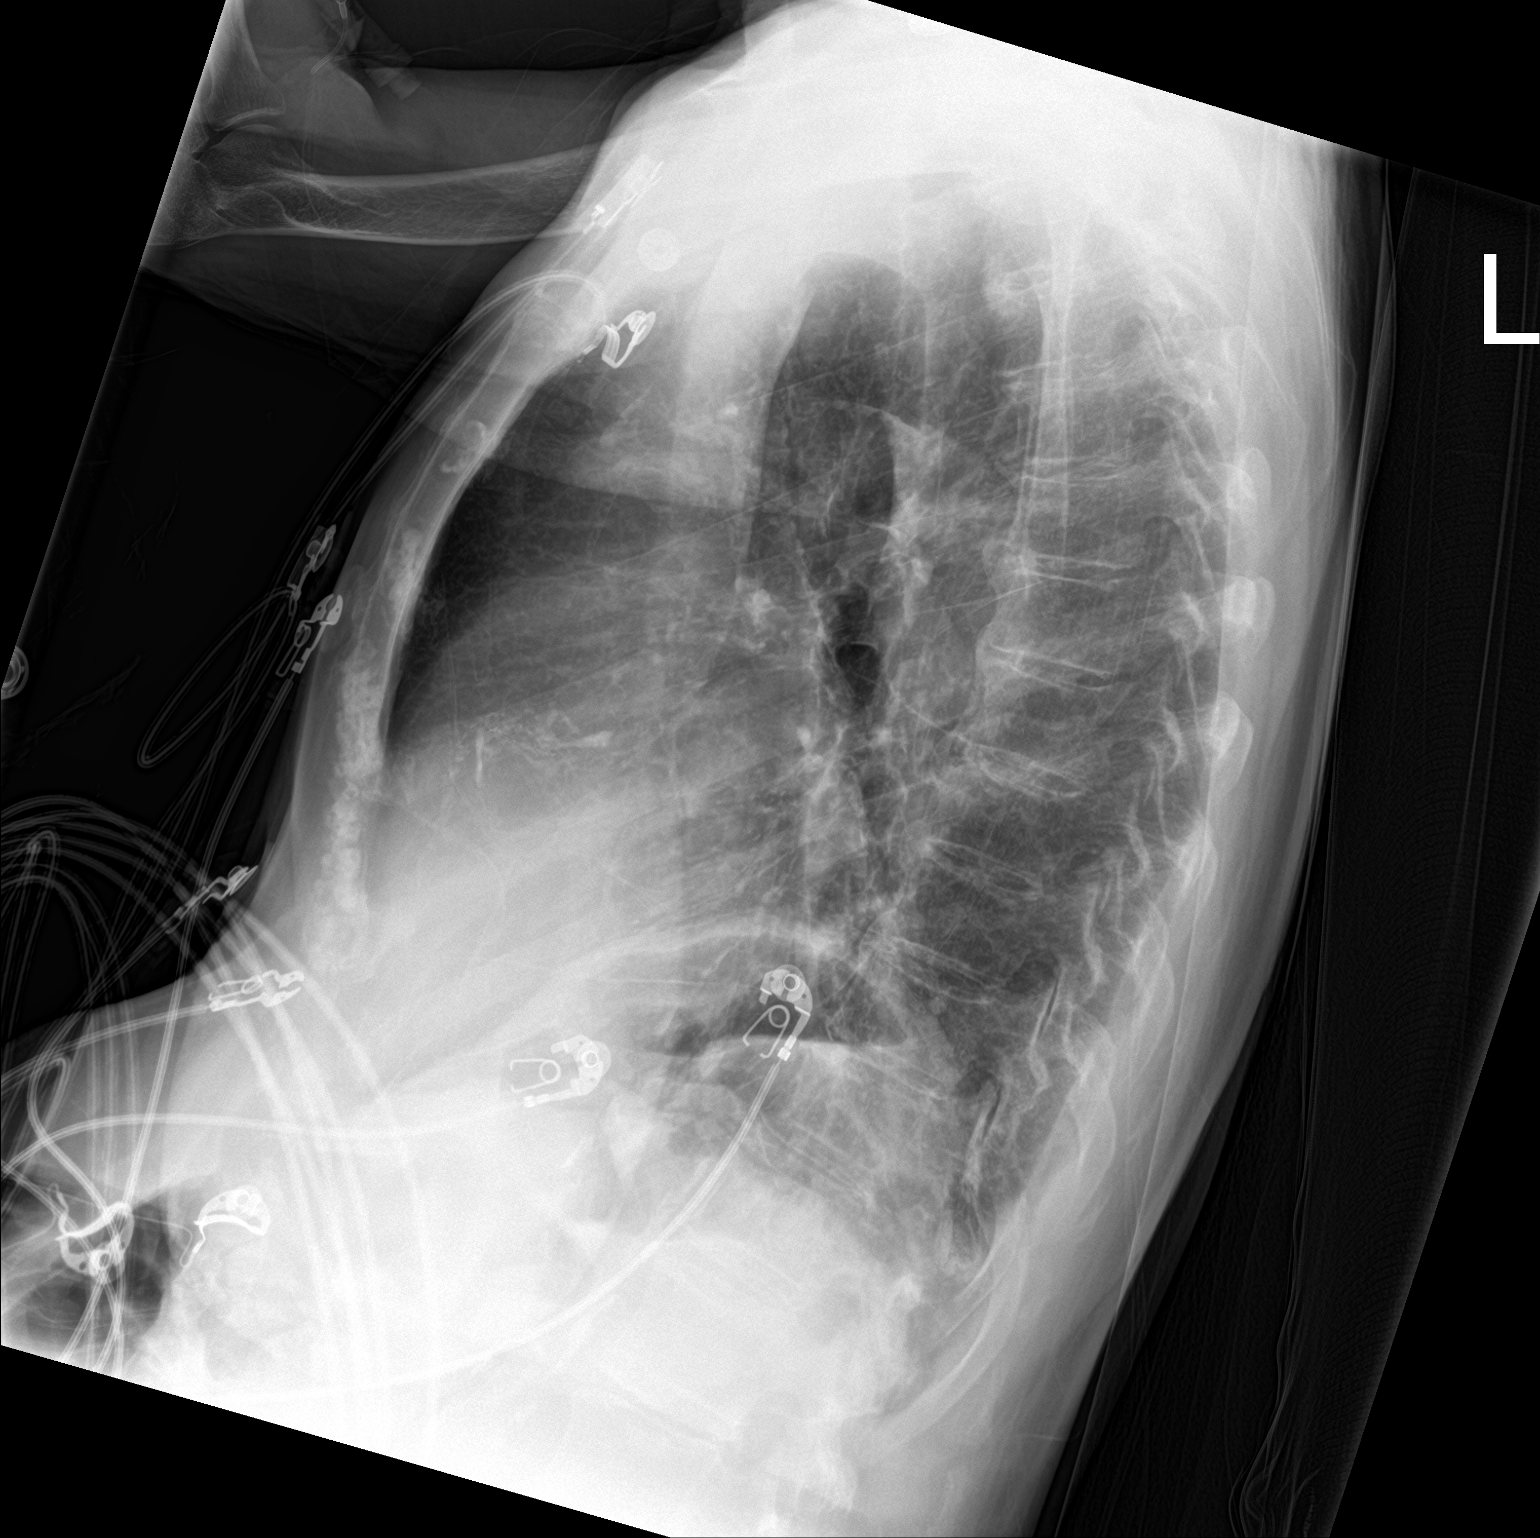

[chest ap]
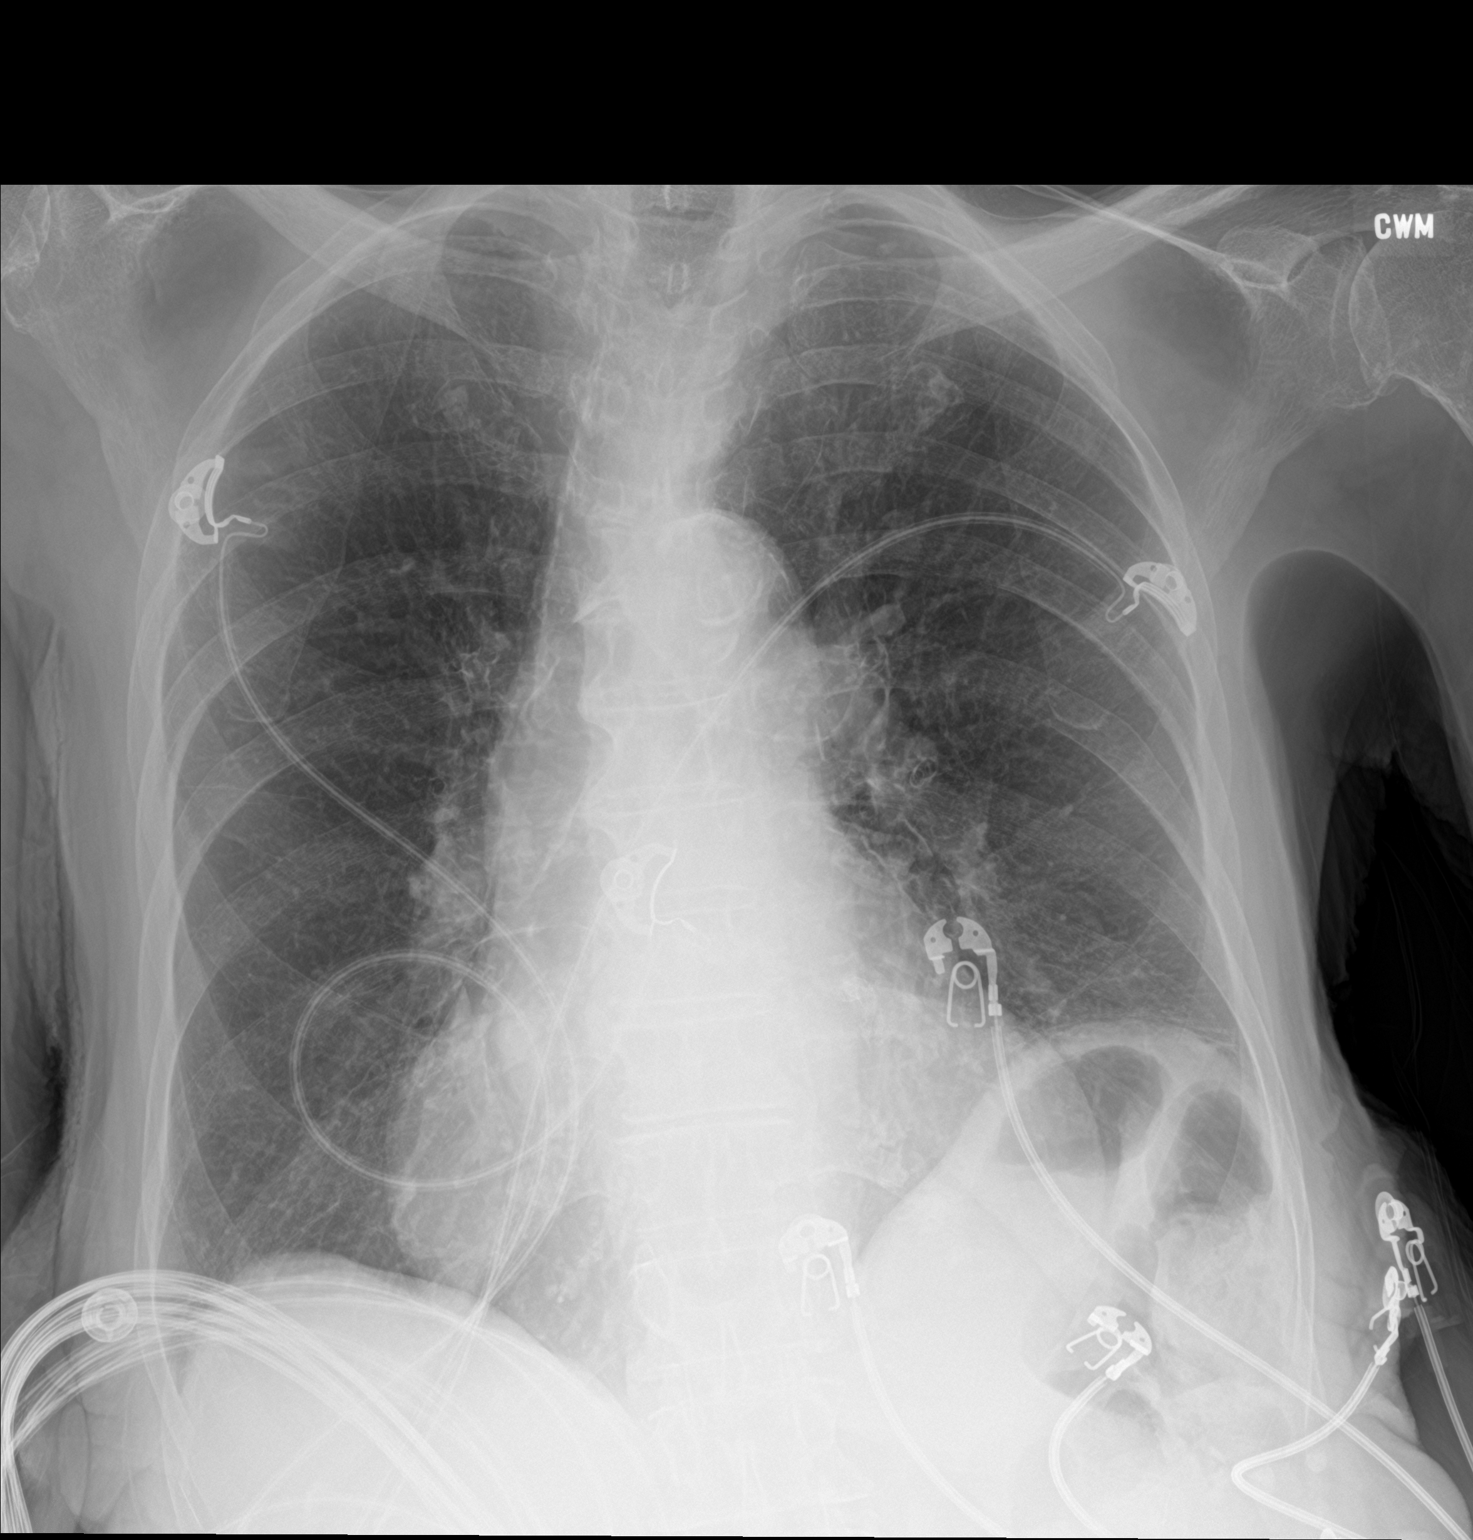

[2 of 2 positions shown; findings below may reference images not displayed]

FINDINGS: Chronic cardiomegaly. Pulmonary vascularity is normal. Aortic
atherosclerosis. No infiltrates or effusions. Chronic elevation of
the right hemidiaphragm. No acute bone abnormality.
IMPRESSION: 1. No acute abnormalities.
2. Chronic cardiomegaly.
3. Aortic atherosclerosis.

## 2019-06-28 MED ORDER — LATANOPROST 0.005 % OP SOLN: 1.0000 [drp] | Freq: Every day | OPHTHALMIC | Status: DC

## 2019-06-28 MED ORDER — BRIMONIDINE TARTRATE 0.2 % OP SOLN
1.0000 [drp] | Freq: Three times a day (TID) | OPHTHALMIC | Status: DC
  Administered 2019-06-28 – 2019-06-29 (×3): 1 [drp] via OPHTHALMIC
  Filled 2019-06-28 (×2): qty 5

## 2019-06-28 MED ORDER — ONDANSETRON HCL 4 MG/2ML IJ SOLN: 4.0000 mg | Freq: Four times a day (QID) | INTRAMUSCULAR | Status: DC | PRN

## 2019-06-28 MED ORDER — DORZOLAMIDE HCL-TIMOLOL MAL 2-0.5 % OP SOLN
1.0000 [drp] | Freq: Two times a day (BID) | OPHTHALMIC | Status: DC
  Administered 2019-06-28 – 2019-06-29 (×2): 1 [drp] via OPHTHALMIC
  Filled 2019-06-28: qty 10

## 2019-06-28 MED ORDER — PANTOPRAZOLE SODIUM 40 MG PO TBEC
40.0000 mg | DELAYED_RELEASE_TABLET | Freq: Every day | ORAL | Status: DC
  Administered 2019-06-29: 40 mg via ORAL
  Filled 2019-06-28: qty 1

## 2019-06-28 MED ORDER — CLOPIDOGREL BISULFATE 75 MG PO TABS
75.0000 mg | ORAL_TABLET | Freq: Every day | ORAL | Status: DC
  Administered 2019-06-29: 75 mg via ORAL
  Filled 2019-06-28: qty 1

## 2019-06-28 MED ORDER — ACETAMINOPHEN 500 MG PO TABS
1000.0000 mg | ORAL_TABLET | ORAL | Status: DC | PRN
  Administered 2019-06-28: 1000 mg via ORAL
  Filled 2019-06-28: qty 2

## 2019-06-28 MED ORDER — MUSCLE RUB 10-15 % EX CREA
TOPICAL_CREAM | CUTANEOUS | Status: DC | PRN
  Filled 2019-06-28: qty 85

## 2019-06-28 MED ORDER — ATORVASTATIN CALCIUM 10 MG PO TABS
10.0000 mg | ORAL_TABLET | Freq: Every day | ORAL | Status: DC
  Administered 2019-06-29: 10 mg via ORAL
  Filled 2019-06-28: qty 1

## 2019-06-28 MED ORDER — ENOXAPARIN SODIUM 40 MG/0.4ML ~~LOC~~ SOLN
40.0000 mg | SUBCUTANEOUS | Status: DC
  Administered 2019-06-28: 40 mg via SUBCUTANEOUS

## 2019-06-28 MED ORDER — ONDANSETRON HCL 4 MG PO TABS: 4.0000 mg | ORAL_TABLET | Freq: Four times a day (QID) | ORAL | Status: DC | PRN

## 2019-06-28 MED ORDER — ENOXAPARIN SODIUM 30 MG/0.3ML ~~LOC~~ SOLN
30.0000 mg | SUBCUTANEOUS | Status: DC
  Administered 2019-06-29: 30 mg via SUBCUTANEOUS
  Filled 2019-06-28: qty 0.3

## 2019-06-28 MED ORDER — POLYETHYLENE GLYCOL 3350 17 G PO PACK
17.0000 g | PACK | Freq: Every day | ORAL | Status: DC
  Administered 2019-06-28 – 2019-06-29 (×2): 17 g via ORAL
  Filled 2019-06-28 (×2): qty 1

## 2019-06-28 MED ORDER — METOPROLOL TARTRATE 25 MG PO TABS
25.0000 mg | ORAL_TABLET | Freq: Every day | ORAL | Status: DC
  Administered 2019-06-29: 25 mg via ORAL
  Filled 2019-06-28: qty 1

## 2019-06-28 MED ORDER — SUCRALFATE 1 G PO TABS
1.0000 g | ORAL_TABLET | Freq: Three times a day (TID) | ORAL | Status: DC
  Administered 2019-06-28 – 2019-06-29 (×5): 1 g via ORAL
  Filled 2019-06-28 (×5): qty 1

## 2019-06-28 MED ORDER — ASPIRIN 81 MG PO CHEW
81.0000 mg | CHEWABLE_TABLET | Freq: Every day | ORAL | Status: DC
  Administered 2019-06-29: 81 mg via ORAL
  Filled 2019-06-28 (×2): qty 1

## 2019-06-28 MED ORDER — SODIUM CHLORIDE 0.9 % IV SOLN
INTRAVENOUS | Status: DC
  Administered 2019-06-28 – 2019-06-29 (×2): via INTRAVENOUS

## 2019-06-28 NOTE — ED Notes (Signed)
Pt to x-ray with transporter .

## 2019-06-28 NOTE — ED Provider Notes (Signed)
[facility]ENC[facility] Provider Note   CSN: [id number] Arrival date & time: 06/28/19  1147     History   Chief Complaint Chief Complaint  Patient presents with  . Weakness    HPI Bonnie Morton is a 85 y.o. female.     Patient with history of coronary artery disease, last with NSTEMI in 2019, treated medically, 2 previous stents, history of diastolic heart failure in 2011, history of gastric ulcer and iron deficiency anemia, thyroidectomy --presents today with episode of hypotension, lightheadedness without full syncope.  Patient awoke in her normal state of health.  No recent illnesses.  Patient began to feel faint and had weakness.  She was attended to by family.  Apparently she did not look well and EMS was called.  Per EMS report, blood pressure was 58/32 at time of arrival.  Blood sugar was 192.  Patient was given small bolus of normal saline and pressures increased to 104/52 on recheck.  Patient denies any recent fevers or other symptoms of illness.  She denies any nausea, vomiting, or diarrhea recently.  No urine symptoms.  No known sick contacts including anyone with coronavirus.     Past Medical History:  Diagnosis Date  . Antral ulcer    chronic atrophic gastritis with intestinal metaplasia and surface erosion  . CAD (coronary artery disease)    Pt. develpoed chest burning w/raiation to her L arm /exertion in 08. She went to ER, found to have NSTEMI, LHC showed long 75-80% proximal to mid LAD stenosis w/99% ostial D2 stenosis and diffuse mild to moderate RCA disease. She had 2 Cypher stents to the proximal to mid LAD. Echo (5/11): EF 60-65%, grade 1 diastolic dysfunction, normal wall motion, no significant valvular abnormalities.   . Glaucoma   . H/O: hysterectomy   . HTN (hypertension)   . Hyperlipidemia   . Iron deficiency anemia    with normal colonoscopy last year per her report.   . Osteoarthritis of hip   . S/P subtotal thyroidectomy      Patient Active Problem List   Diagnosis Date Noted  . CAD (coronary artery disease)   . Chest pain 12/25/2017  . Non-ST elevation (NSTEMI) myocardial infarction (HCC)   . Edema of lower extremity 08/05/2011  . Ulcer, peptic, acute or chronic 08/03/2011  . Anemia 08/01/2011  . Constipation 08/01/2011  . Hyperlipidemia 03/30/2011  . Essential hypertension 01/10/2010  . CORONARY ATHEROSCLEROSIS NATIVE CORONARY ARTERY 01/10/2010    Past Surgical History:  Procedure Laterality Date  . ABDOMINAL HYSTERECTOMY    . ESOPHAGOGASTRODUODENOSCOPY  08/03/2011   Procedure: ESOPHAGOGASTRODUODENOSCOPY (EGD);  Surgeon: Hart Carwinora M Brodie, MD;  [facility];  Service: Endoscopy;  Laterality: N/A;  . EYE SURGERY     R eye cataract surgery  . LEFT HEART CATH AND CORONARY ANGIOGRAPHY N/A 12/25/2017   Procedure: LEFT HEART CATH AND CORONARY ANGIOGRAPHY;  Surgeon: Tonny Bollmanooper, Michael, MD;  [facility];  Service: Cardiovascular;  Laterality: N/A;  . THYROIDECTOMY     subtotal     OB History   No obstetric history on file.      Home Medications    Prior to Admission medications   Medication Sig Start Date End Date Taking? Authorizing Provider  acetaminophen (TYLENOL) 500 MG tablet Take 1,000 mg by mouth as needed for mild pain or headache.    [provider]  amLODipine (NORVASC) 2.5 MG tablet Take 1 tablet (2.5 mg total) by mouth daily. 12/28/17  Alison Murray, MD  aspirin 81 MG tablet Take 81 mg by mouth daily.     [provider]  atorvastatin (LIPITOR) 10 MG tablet Take 10 mg by mouth daily. 10/26/17   [provider]  brimonidine (ALPHAGAN) 0.2 % ophthalmic solution Place 1 drop into both eyes every 8 (eight) hours.     [provider]  clopidogrel (PLAVIX) 75 MG tablet Take 1 tablet (75 mg total) by mouth daily. 12/27/17   Alison Murray, MD  DM-APAP-CPM (CORICIDIN HBP FLU PO) Take 1 tablet by mouth as needed (flu symptoms).     [provider]  dorzolamide-timolol (COSOPT) 22.3-6.8 MG/ML ophthalmic solution Place 1 drop into both eyes 2 (two) times daily.     [provider]  latanoprost (XALATAN) 0.005 % ophthalmic solution Place 1 drop into both eyes Daily. 02/27/11   [provider]  losartan-hydrochlorothiazide (HYZAAR) 100-12.5 MG tablet Take 1 tablet by mouth daily. 10/26/17   [provider]  metoprolol tartrate (LOPRESSOR) 25 MG tablet Take 25 mg by mouth daily.     [provider]  pantoprazole (PROTONIX) 40 MG tablet Take 1 tablet (40 mg total) by mouth daily at 6 (six) AM. 08/05/11 08/04/12  Rodolph Bong, MD  Sennosides (SENNA-EX PO) Take 1 tablet by mouth daily as needed (constipation).    [provider]  sucralfate (CARAFATE) 1 G tablet Take 1 tablet (1 g total) by mouth 4 (four) times daily -  with meals and at bedtime. 08/05/11 08/04/12  Rodolph Bong, MD    Family History Family History  Problem Relation Age of Onset  . Hypertension Mother   . Lung cancer Father     Social History Social History   Tobacco Use  . Smoking status: Never Smoker  . Smokeless tobacco: Never Used  Substance Use Topics  . Alcohol use: No  . Drug use: Not on file     Allergies   Patient has no known allergies.   Review of Systems Review of Systems  Constitutional: Positive for diaphoresis. Negative for fever.  HENT: Negative for rhinorrhea and sore throat.   Eyes: Negative for redness.  Respiratory: Negative for cough.   Cardiovascular: Negative for chest pain.  Gastrointestinal: Positive for nausea. Negative for abdominal pain, diarrhea and vomiting.  Genitourinary: Negative for dysuria.  Musculoskeletal: Negative for myalgias.  Skin: Negative for rash.  Neurological: Positive for weakness (generalized) and light-headedness. Negative for dizziness, syncope, facial asymmetry, speech difficulty and headaches.     Physical Exam Updated  Vital Signs BP 129/68 (BP Location: Right Arm)   Pulse (!) 50   Temp 98 F (36.7 C) (Oral)   Resp 16   Ht  (1.6 m)   Wt 53.5 kg   SpO2 100%   BMI 20.90 kg/m   Physical Exam Vitals signs and nursing note reviewed.  Constitutional:      Appearance: She is well-developed. She is not diaphoretic.  HENT:     Head: Normocephalic and atraumatic.     Mouth/Throat:     Mouth: Mucous membranes are not dry.  Eyes:     Conjunctiva/sclera: Conjunctivae normal.  Neck:     Musculoskeletal: Normal range of motion and neck supple. No muscular tenderness.     Vascular: Normal carotid pulses. No carotid bruit or JVD.     Trachea: Trachea normal. No tracheal deviation.  Cardiovascular:     Rate and Rhythm: Regular rhythm. Bradycardia present.     Pulses:  No decreased pulses.     Heart sounds: Normal heart sounds, S1 normal and S2 normal. No murmur.  Pulmonary:     Effort: Pulmonary effort is normal. No respiratory distress.     Breath sounds: No wheezing.  Chest:     Chest wall: No tenderness.  Abdominal:     General: Bowel sounds are normal.     Palpations: Abdomen is soft.     Tenderness: There is no abdominal tenderness. There is no guarding or rebound.  Musculoskeletal: Normal range of motion.  Skin:    General: Skin is warm and dry.     Coloration: Skin is not pale.  Neurological:     General: No focal deficit present.     Mental Status: She is alert and oriented to person, place, and time.     Comments: No gross neuro deficits.       ED Treatments / Results  Labs (all labs ordered are listed, but only abnormal results are displayed) Labs Reviewed  CBC WITH DIFFERENTIAL/PLATELET - Abnormal; Notable for the following components:      Result Value   RBC 3.29 (*)    Hemoglobin 10.7 (*)    HCT 34.2 (*)    MCV 104.0 (*)    All other components within normal limits  COMPREHENSIVE METABOLIC PANEL - Abnormal; Notable for the following components:   CO2 20 (*)    Glucose,  Bld 169 (*)    BUN 47 (*)    Creatinine, Ser 1.04 (*)    Albumin 3.4 (*)    GFR calc non Af Amer 48 (*)    GFR calc Af Amer 55 (*)    All other components within normal limits  TROPONIN I (HIGH SENSITIVITY) - Abnormal; Notable for the following components:   Troponin I (High Sensitivity) 36 (*)    All other components within normal limits  SARS CORONAVIRUS 2 (TAT 6-24 HRS)  TSH  URINALYSIS, ROUTINE W REFLEX MICROSCOPIC  CBC  CREATININE, SERUM  TROPONIN I (HIGH SENSITIVITY)    ED ECG REPORT   Date: 06/28/2019  Rate: 60  Rhythm: normal sinus rhythm  QRS Axis: left  Intervals: normal  ST/T Wave abnormalities: nonspecific T wave changes  Conduction Disutrbances:right bundle branch block and left anterior fascicular block  Narrative Interpretation:   Old EKG Reviewed: changes noted, lateral t-waves improved today  I have personally reviewed the EKG tracing and agree with the computerized printout as noted.  Radiology Dg Chest 2 View  Result Date: 06/28/2019 CLINICAL DATA:  Weakness and hypotension. EXAM: CHEST - 2 VIEW COMPARISON:  12/25/2017 and 08/01/2011 FINDINGS: Chronic cardiomegaly. Pulmonary vascularity is normal. Aortic atherosclerosis. No infiltrates or effusions. Chronic elevation of the right hemidiaphragm. No acute bone abnormality. IMPRESSION: 1. No acute abnormalities. 2. Chronic cardiomegaly. 3. Aortic atherosclerosis. Electronically Signed   By: Francene Boyers M.D.   On: 06/28/2019 12:55    Procedures Procedures (including critical care time)  Medications Ordered in ED Medications - No data to display   Initial Impression / Assessment and Plan / ED Course  I have reviewed the triage vital signs and the nursing notes.  Pertinent labs & imaging results that were available during my care of the patient were reviewed by me and considered in my medical decision making (see chart for details).        Patient seen and examined. No gross neuro deficits.  She  does not appear to be appreciably dehydrated.  She seems and reports she  is back at her baseline on arrival. Work-up pending. Will likely need admit for observation 2/2 risk factors with hypotension/near syncope.  She has somewhat bradycardic however is on a beta-blocker.  Vital signs reviewed and are as follows: BP 129/68 (BP Location: Right Arm)   Pulse (!) 50   Temp 98 F (36.7 C) (Oral)   Resp 16   Ht 5\' 3"  (1.6 m)   Wt 53.5 kg   SpO2 100%   BMI 20.90 kg/m   3:42 PM work-up shows mildly elevated troponin.  Patient is otherwise stable.  Not orthostatic however she is unable to stand at baseline.  Discussed results with patient.  She is amenable to staying in the hospital overnight for observation.  Patient does request evaluation of her right knee while in the hospital because of her chronic osteoarthritis.  I spoke with Dr. Doristine Bosworth who will see.   Final Clinical Impressions(s) / ED Diagnoses   Final diagnoses:  Near syncope  Hypotension, unspecified hypotension type   Admit.    ED Discharge Orders    None       Carlisle Cater, Hershal Coria 06/28/19 1545    Davonna Belling, MD 06/28/19 1623

## 2019-06-28 NOTE — ED Triage Notes (Signed)
Pt arrives via EMS from home with generalized weakness. Hypotensive for EMS 58/32. 12 lead SB, rate of 56, 99% RA, cbg 192. Alert, oriented x4. Repeat bp 104/52. 18g LAC. 171ml NS.

## 2019-06-28 NOTE — ED Notes (Signed)
ED TO INPATIENT HANDOFF REPORT  ED Nurse Name and Phone #: Magnus Ivan, RN [phone]  S Name/Age/Gender Bonnie Morton 85 y.o. female Room/Bed: 047C/047C  Code Status   Code Status: DNR  Home/SNF/Other Home Patient oriented to: self, place, time and situation Is this baseline? Yes   Triage Complete: Triage complete  Chief Complaint weakness  Triage Note Pt arrives via EMS from home with generalized weakness. Hypotensive for EMS 58/32. 12 lead SB, rate of 56, 99% RA, cbg 192. Alert, oriented x4. Repeat bp 104/52. 18g LAC. NS.   Allergies No Known Allergies  Level of Care/Admitting Diagnosis ED Disposition    ED Disposition Condition Comment   Admit  Hospital Area: [facility] [[id number]]  Level of Care: Telemetry Medical [104]  I expect the patient will be discharged within 24 hours: Yes  LOW acuity---Tx typically complete <24 hrs---ACUTE conditions typically can be evaluated <24 hours---LABS likely to return to acceptable levels <24 hours---IS near functional baseline---EXPECTED to return to current living arrangement---NOT newly hypoxic: Meets criteria for 5C-Observation unit  Covid Evaluation: Asymptomatic Screening Protocol (No Symptoms)  Diagnosis: Near syncope [093235]  Admitting Physician: Ollen Bowl [[id number]]  Attending Physician: Ollen Bowl [phone]  PT Class (Do Not Modify): Observation [104]  PT Acc Code (Do Not Modify): Observation [10022]       B Medical/Surgery History Past Medical History:  Diagnosis Date  . Antral ulcer    chronic atrophic gastritis with intestinal metaplasia and surface erosion  . CAD (coronary artery disease)    Pt. develpoed chest burning w/raiation to her L arm /exertion in 08. She went to ER, found to have NSTEMI, LHC showed long 75-80% proximal to mid LAD stenosis w/99% ostial D2 stenosis and diffuse mild to moderate RCA disease. She had 2 Cypher stents to the proximal to mid LAD. Echo (5/11): EF  60-65%, grade 1 diastolic dysfunction, normal wall motion, no significant valvular abnormalities.   . Glaucoma   . H/O: hysterectomy   . HTN (hypertension)   . Hyperlipidemia   . Iron deficiency anemia    with normal colonoscopy last year per her report.   . Osteoarthritis of hip   . S/P subtotal thyroidectomy    Past Surgical History:  Procedure Laterality Date  . ABDOMINAL HYSTERECTOMY    . ESOPHAGOGASTRODUODENOSCOPY  08/03/2011   Procedure: ESOPHAGOGASTRODUODENOSCOPY (EGD);  Surgeon: Hart Carwin, MD;  Location: [facility];  Service: Endoscopy;  Laterality: N/A;  . EYE SURGERY     R eye cataract surgery  . LEFT HEART CATH AND CORONARY ANGIOGRAPHY N/A 12/25/2017   Procedure: LEFT HEART CATH AND CORONARY ANGIOGRAPHY;  Surgeon: Tonny Bollman, MD;  Location: [facility] INVASIVE CV LAB;  Service: Cardiovascular;  Laterality: N/A;  . THYROIDECTOMY     subtotal     A IV Location/Drains/Wounds Patient Lines/Drains/Airways Status   Active Line/Drains/Airways    Name:   Placement date:   Placement time:   Site:   Days:   Peripheral IV 06/28/19 Left Antecubital   06/28/19    -    Antecubital   less than 1          Intake/Output Last 24 hours No intake or output data in the 24 hours ending 06/28/19 1818  Labs/Imaging Results for orders placed or performed during the hospital encounter of 06/28/19 (from the past 48 hour(s))  CBC with Differential/Platelet     Status: Abnormal   Collection Time: 06/28/19 12:59 PM  Result Value Ref Range  WBC 8.5 4.0 - 10.5 K/uL   RBC 3.29 (L) 3.87 - 5.11 MIL/uL   Hemoglobin 10.7 (L) 12.0 - 15.0 g/dL   HCT 34.2 (L) 36.0 - 46.0 %   MCV 104.0 (H) 80.0 - 100.0 fL   MCH 32.5 26.0 - 34.0 pg   MCHC 31.3 30.0 - 36.0 g/dL   RDW 12.6 11.5 - 15.5 %   Platelets 235 150 - 400 K/uL   nRBC 0.0 0.0 - 0.2 %   Neutrophils Relative % 79 %   Neutro Abs 6.8 1.7 - 7.7 K/uL   Lymphocytes Relative 12 %   Lymphs Abs 1.0 0.7 - 4.0 K/uL   Monocytes Relative 6 %    Monocytes Absolute 0.5 0.1 - 1.0 K/uL   Eosinophils Relative 2 %   Eosinophils Absolute 0.1 0.0 - 0.5 K/uL   Basophils Relative 1 %   Basophils Absolute 0.1 0.0 - 0.1 K/uL   Immature Granulocytes 0 %   Abs Immature Granulocytes 0.03 0.00 - 0.07 K/uL    Comment: Performed at [facility] [address] [place], [place] [address]  Comprehensive metabolic panel     Status: Abnormal   Collection Time: 06/28/19 12:59 PM  Result Value Ref Range   Sodium 139 135 - 145 mmol/L   Potassium 4.1 3.5 - 5.1 mmol/L   Chloride 106 98 - 111 mmol/L   CO2 20 (L) 22 - 32 mmol/L   Glucose, Bld 169 (H) 70 - 99 mg/dL   BUN 47 (H) 8 - 23 mg/dL   Creatinine, Ser 1.04 (H) 0.44 - 1.00 mg/dL   Calcium 10.3 8.9 - 10.3 mg/dL   Total Protein 7.5 6.5 - 8.1 g/dL   Albumin 3.4 (L) 3.5 - 5.0 g/dL   AST 31 15 - 41 U/L   ALT 18 0 - 44 U/L   Alkaline Phosphatase 63 38 - 126 U/L   Total Bilirubin 1.0 0.3 - 1.2 mg/dL   GFR calc non Af Amer 48 (L) >60 mL/min   GFR calc Af Amer 55 (L) >60 mL/min   Anion gap 13 5 - 15    Comment: Performed at [facility] [address] [place], [place] [address]  Troponin I (High Sensitivity)     Status: Abnormal   Collection Time: 06/28/19 12:59 PM  Result Value Ref Range   Troponin I (High Sensitivity) 36 (H) <18 ng/L    Comment: (NOTE) Elevated high sensitivity troponin I (hsTnI) values and significant  changes across serial measurements may suggest ACS but many other  chronic and acute conditions are known to elevate hsTnI results.  Refer to the "Links" section for chest pain algorithms and additional  guidance. Performed at [facility], [place] [address], [place], [place] [address]   TSH     Status: None   Collection Time: 06/28/19 12:59 PM  Result Value Ref Range   TSH 1.558 0.350 - 4.500 uIU/mL    Comment: Performed by a 3rd Generation assay with a functional sensitivity of <=0.01 uIU/mL. Performed at [facility], [place] [address]  [place], [place] [address]   Troponin I (High Sensitivity)     Status: Abnormal   Collection Time: 06/28/19  3:05 PM  Result Value Ref Range   Troponin I (High Sensitivity) 37 (H) <18 ng/L    Comment: (NOTE) Elevated high sensitivity troponin I (hsTnI) values and significant  changes across serial measurements may suggest ACS but many other  chronic  and acute conditions are known to elevate hsTnI results.  Refer to the "Links" section for chest pain algorithms and additional  guidance. Performed at [facility], [address] [place], [place] [id number]    Dg Chest 2 View  Result Date: 06/28/2019 CLINICAL DATA:  Weakness and hypotension. EXAM: CHEST - 2 VIEW COMPARISON:  12/25/2017 and 08/01/2011 FINDINGS: Chronic cardiomegaly. Pulmonary vascularity is normal. Aortic atherosclerosis. No infiltrates or effusions. Chronic elevation of the right hemidiaphragm. No acute bone abnormality. IMPRESSION: 1. No acute abnormalities. 2. Chronic cardiomegaly. 3. Aortic atherosclerosis. Electronically Signed   By: Francene BoyersJames  Maxwell M.D.   On: 06/28/2019 12:55    Pending Labs Unresulted Labs (From admission, onward)    Start     Ordered   07/05/19 0500  Creatinine, serum  (enoxaparin (LOVENOX)    CrCl >/= 30 ml/min)  Weekly,   R    Comments: while on enoxaparin therapy    06/28/19 1524   06/29/19 0500  Comprehensive metabolic panel  Tomorrow morning,   R     06/28/19 1524   06/29/19 0500  CBC  Tomorrow morning,   R     06/28/19 1524   06/28/19 1523  CBC  (enoxaparin (LOVENOX)    CrCl >/= 30 ml/min)  Once,   STAT    Comments: Baseline for enoxaparin therapy IF NOT ALREADY DRAWN.  Notify MD if PLT < 100 K.    06/28/19 1524   06/28/19 1523  Creatinine, serum  (enoxaparin (LOVENOX)    CrCl >/= 30 ml/min)  Once,   STAT    Comments: Baseline for enoxaparin therapy IF NOT ALREADY DRAWN.    06/28/19 1524   06/28/19 1458  SARS CORONAVIRUS 2 (TAT 6-24 HRS) Nasopharyngeal Nasopharyngeal Swab   (Asymptomatic/Tier 2 Patients Labs)  Once,   STAT    Question Answer Comment  Is this test for diagnosis or screening Screening   Symptomatic for COVID-19 as defined by CDC No   Hospitalized for COVID-19 No   Admitted to ICU for COVID-19 No   Previously tested for COVID-19 No   Resident in a congregate (group) care setting No   Employed in healthcare setting No   Pregnant No      06/28/19 1457   06/28/19 1207  Urinalysis, Routine w reflex microscopic  Once,   STAT     06/28/19 1207          Vitals/Pain Today's Vitals   06/28/19 1600 06/28/19 1615 06/28/19 1630 06/28/19 1645  BP: (!) 138/56 117/70 (!) 111/22 (!) 105/59  Pulse: 61 62 63 (!) 58  Resp: 19 20 15 17   Temp:      TempSrc:      SpO2: 100% 100% 100% 100%  Weight:      Height:      PainSc:        Isolation Precautions No active isolations  Medications Medications  acetaminophen (TYLENOL) tablet 1,000 mg (has no administration in time range)  aspirin chewable tablet 81 mg (81 mg Oral Refused 06/28/19 1654)  atorvastatin (LIPITOR) tablet 10 mg (has no administration in time range)  metoprolol tartrate (LOPRESSOR) tablet 25 mg (has no administration in time range)  pantoprazole (PROTONIX) EC tablet 40 mg (has no administration in time range)  sucralfate (CARAFATE) tablet 1 g (1 g Oral Given 06/28/19 1655)  clopidogrel (PLAVIX) tablet 75 mg (has no administration in time range)  brimonidine (ALPHAGAN) 0.2 % ophthalmic solution 1 drop (has no administration in time range)  dorzolamide-timolol (COSOPT) 22.3-6.8 MG/ML ophthalmic solution 1 drop (has no administration in time range)  latanoprost (XALATAN) 0.005 % ophthalmic solution 1 drop (has no administration in time range)  enoxaparin (LOVENOX) injection 40 mg (40 mg Subcutaneous Given 06/28/19 1654)  0.9 %  sodium chloride infusion ( Intravenous New Bag/Given 06/28/19 1658)  ondansetron (ZOFRAN) tablet 4 mg (has no administration in time range)    Or   ondansetron (ZOFRAN) injection 4 mg (has no administration in time range)  polyethylene glycol (MIRALAX / GLYCOLAX) packet 17 g (17 g Oral Given 06/28/19 1738)    Mobility walks with person assist Low fall risk   Focused Assessments Neuro Assessment Handoff:  Swallow screen pass? Yes          Neuro Assessment:   Neuro Checks:      Last Documented NIHSS Modified Score:   Has TPA been given? No If patient is a Neuro Trauma and patient is going to OR before floor call report to 4N Charge nurse: [phone] or [phone]     R Recommendations: See Admitting Provider Note  Report given to:   Additional Notes:

## 2019-06-28 NOTE — ED Notes (Signed)
Threasa Beards (niece) contact number [phone]; please contact first for any questions / discharge dispositions and such.

## 2019-06-28 NOTE — H&P (Signed)
History and Physical    Bonnie Morton WFU:[id number] DOB: 11/10/1927 DOA: 06/28/2019  PCP: Willey Blade, MD  Patient coming from: Home  I have personally briefly reviewed patient's old medical records in [place]  Chief Complaint: Dizziness/low blood pressure  HPI: Bonnie Morton is a 85 y.o. female with medical history significant of CAD, hypertension, hyperlipidemia and iron deficiency anemia was brought into the ED due to dizziness and hypotension.  According to patient, she was in her normal state of health when she woke up this morning.  She sat down to have her breakfast around 10 with her family and then she suddenly she did not feel well.  She told her family members, she was noted to have diaphoresis and she felt hot and dizzy.  She did not lose her consciousness during the event..  According to her, her brother-in-law checked her blood pressure and it was " low", she does not remember the numbers.  Since she was not looking good to family members, they called EMS.  When EMS arrived, her blood pressure was 58/32 and her blood sugar was 192.  She was escorted to the emergency department.  She was given IV fluids in route.  Upon arrival to ED, her initial blood pressure was 104/52.  She had no other complaints such as chest pain, shortness of breath or any other complaint.  ED Course: Upon arrival to ED, her blood pressure was 104/52.  Her renal function was at baseline and so was her CBC.  EKG with no changes.  Mild elevation of troponin.  Hospitalist service was called to admit the patient for observation overnight.  Review of Systems: As per HPI otherwise negative.    Past Medical History:  Diagnosis Date  . Antral ulcer    chronic atrophic gastritis with intestinal metaplasia and surface erosion  . CAD (coronary artery disease)    Pt. develpoed chest burning w/raiation to her L arm /exertion in 08. She went to ER, found to have NSTEMI, LHC showed long 75-80% proximal  to mid LAD stenosis w/99% ostial D2 stenosis and diffuse mild to moderate RCA disease. She had 2 Cypher stents to the proximal to mid LAD. Echo (5/11): EF 20-25%, grade 1 diastolic dysfunction, normal wall motion, no significant valvular abnormalities.   . Glaucoma   . H/O: hysterectomy   . HTN (hypertension)   . Hyperlipidemia   . Iron deficiency anemia    with normal colonoscopy last year per her report.   . Osteoarthritis of hip   . S/P subtotal thyroidectomy     Past Surgical History:  Procedure Laterality Date  . ABDOMINAL HYSTERECTOMY    . ESOPHAGOGASTRODUODENOSCOPY  08/03/2011   Procedure: ESOPHAGOGASTRODUODENOSCOPY (EGD);  Surgeon: Lafayette Dragon, MD;  Location: [facility];  Service: Endoscopy;  Laterality: N/A;  . EYE SURGERY     R eye cataract surgery  . LEFT HEART CATH AND CORONARY ANGIOGRAPHY N/A 12/25/2017   Procedure: LEFT HEART CATH AND CORONARY ANGIOGRAPHY;  Surgeon: Sherren Mocha, MD;  Location: [facility];  Service: Cardiovascular;  Laterality: N/A;  . THYROIDECTOMY     subtotal     reports that she has never smoked. She has never used smokeless tobacco. She reports that she does not drink alcohol. No history on file for drug.  No Known Allergies  Family History  Problem Relation Age of Onset  . Hypertension Mother   . Lung cancer Father     Prior to Admission medications  Medication Sig Start Date End Date Taking? Authorizing Provider  acetaminophen (TYLENOL) 500 MG tablet Take 1,000 mg by mouth as needed for mild pain or headache.   Yes [provider]  amLODipine (NORVASC) 2.5 MG tablet Take 1 tablet (2.5 mg total) by mouth daily. 12/28/17  Yes Alison Murray, MD  aspirin 81 MG tablet Take 81 mg by mouth daily.    Yes [provider]  atorvastatin (LIPITOR) 10 MG tablet Take 10 mg by mouth daily. 10/26/17  Yes [provider]  brimonidine (ALPHAGAN) 0.2 % ophthalmic solution Place 1 drop into both eyes every 8 (eight)  hours.    Yes [provider]  clopidogrel (PLAVIX) 75 MG tablet Take 1 tablet (75 mg total) by mouth daily. 12/27/17  Yes Alison Murray, MD  DM-APAP-CPM (CORICIDIN HBP FLU PO) Take 1 tablet by mouth as needed (flu symptoms).   Yes [provider]  dorzolamide-timolol (COSOPT) 22.3-6.8 MG/ML ophthalmic solution Place 1 drop into both eyes 2 (two) times daily.    Yes [provider]  metoprolol tartrate (LOPRESSOR) 25 MG tablet Take 25 mg by mouth daily.    Yes [provider]  nitroGLYCERIN (NITROSTAT) 0.4 MG SL tablet Place 0.4 mg under the tongue every 5 (five) minutes as needed for chest pain.   Yes [provider]  potassium chloride (KLOR-CON) 10 MEQ tablet Take 10 mEq by mouth daily.   Yes [provider]  Sennosides (SENNA-EX PO) Take 1 tablet by mouth daily as needed (constipation).   Yes [provider]  valsartan-hydrochlorothiazide (DIOVAN-HCT) 160-25 MG tablet Take 1 tablet by mouth daily.   Yes [provider]    Physical Exam: Vitals:   06/28/19 1415 06/28/19 1430 06/28/19 1445 06/28/19 1500  BP: (!) 109/58 (!) 105/53 102/65 119/77  Pulse: (!) 55 (!) 50 60 63  Resp: (!) 21 17 (!) 22 16  Temp:      TempSrc:      SpO2: 100% 100% 100% 100%  Weight:      Height:        Constitutional: NAD, calm, comfortable Vitals:   06/28/19 1415 06/28/19 1430 06/28/19 1445 06/28/19 1500  BP: (!) 109/58 (!) 105/53 102/65 119/77  Pulse: (!) 55 (!) 50 60 63  Resp: (!) 21 17 (!) 22 16  Temp:      TempSrc:      SpO2: 100% 100% 100% 100%  Weight:      Height:       Eyes: PERRL, lids and conjunctivae normal ENMT: Mucous membranes are dry. Posterior pharynx clear of any exudate or lesions.Normal dentition.  Neck: normal, supple, no masses, no thyromegaly Respiratory: clear to auscultation bilaterally, no wheezing, no crackles. Normal respiratory effort. No accessory muscle use.  Cardiovascular: Regular rate and rhythm,  no murmurs / rubs / gallops. No extremity edema. 2+ pedal pulses. No carotid bruits.  Abdomen: no tenderness, no masses palpated. No hepatosplenomegaly. Bowel sounds positive.  Musculoskeletal: no clubbing / cyanosis. No joint deformity upper and lower extremities. Good ROM, no contractures. Normal muscle tone.  Skin: no rashes, lesions, ulcers. No induration Neurologic: CN 2-12 grossly intact. Sensation intact, DTR normal. Strength 5/5 in all 4.  Psychiatric: Normal judgment and insight. Alert and oriented x 3. Normal mood.    Labs on Admission: I have personally reviewed following labs and imaging studies  CBC: Recent Labs  Lab 06/28/19 1259  WBC 8.5  NEUTROABS 6.8  HGB 10.7*  HCT 34.2*  MCV 104.0*  PLT 235   Basic Metabolic Panel: Recent Labs  Lab 06/28/19 1259  NA 139  K 4.1  CL 106  CO2 20*  GLUCOSE 169*  BUN 47*  CREATININE 1.04*  CALCIUM 10.3   GFR: Estimated Creatinine Clearance: 30.3 mL/min (A) (by C-G formula based on SCr of 1.04 mg/dL (H)). Liver Function Tests: Recent Labs  Lab 06/28/19 1259  AST 31  ALT 18  ALKPHOS 63  BILITOT 1.0  PROT 7.5  ALBUMIN 3.4*   No results for input(s): LIPASE, AMYLASE in the last 168 hours. No results for input(s): AMMONIA in the last 168 hours. Coagulation Profile: No results for input(s): INR, PROTIME in the last 168 hours. Cardiac Enzymes: No results for input(s): CKTOTAL, CKMB, CKMBINDEX, TROPONINI in the last 168 hours. BNP (last 3 results) No results for input(s): PROBNP in the last 8760 hours. HbA1C: No results for input(s): HGBA1C in the last 72 hours. CBG: No results for input(s): GLUCAP in the last 168 hours. Lipid Profile: No results for input(s): CHOL, HDL, LDLCALC, TRIG, CHOLHDL, LDLDIRECT in the last 72 hours. Thyroid Function Tests: Recent Labs    06/28/19 1259  TSH 1.558   Anemia Panel: No results for input(s): VITAMINB12, FOLATE, FERRITIN, TIBC, IRON, RETICCTPCT in the last 72 hours. Urine  analysis:    Component Value Date/Time   COLORURINE YELLOW 08/02/2011 1420   APPEARANCEUR CLEAR 08/02/2011 1420   LABSPEC 1.008 08/02/2011 1420   PHURINE 7.5 08/02/2011 1420   GLUCOSEU NEGATIVE 08/02/2011 1420   HGBUR NEGATIVE 08/02/2011 1420   BILIRUBINUR NEGATIVE 08/02/2011 1420   KETONESUR NEGATIVE 08/02/2011 1420   PROTEINUR NEGATIVE 08/02/2011 1420   UROBILINOGEN 0.2 08/02/2011 1420   NITRITE NEGATIVE 08/02/2011 1420   LEUKOCYTESUR NEGATIVE 08/02/2011 1420    Radiological Exams on Admission: Dg Chest 2 View  Result Date: 06/28/2019 CLINICAL DATA:  Weakness and hypotension. EXAM: CHEST - 2 VIEW COMPARISON:  12/25/2017 and 08/01/2011 FINDINGS: Chronic cardiomegaly. Pulmonary vascularity is normal. Aortic atherosclerosis. No infiltrates or effusions. Chronic elevation of the right hemidiaphragm. No acute bone abnormality. IMPRESSION: 1. No acute abnormalities. 2. Chronic cardiomegaly. 3. Aortic atherosclerosis. Electronically Signed   By: Francene BoyersJames  Maxwell M.D.   On: 06/28/2019 12:55    EKG: Independently reviewed.  Sinus rhythm with no acute ST-T wave changes  Assessment/Plan Active Problems:   CORONARY ATHEROSCLEROSIS NATIVE CORONARY ARTERY   Near syncope   Dehydration   Hypotension   Near syncope/hypotension/dehydration: According to her, her blood pressure usually runs high.  She drinks about 1.5 L of fluid every day.  On examination, she looks dry and dehydrated.  Her blood pressure was low likely secondary to dehydration and she is on couple of antihypertensives as well.  At this point in time, I will hydrate her with normal saline gently @ 100 cc/h and hold her antihypertensives and monitor her on telemetry.  I do not suspect any valvular heart disease based on her symptoms so I will forego transthoracic echo.  Most likely she will be ready to be discharged home tomorrow unless we find an arrhythmia which may have caused this.  I will resume her beta-blocker  though.  Hyperlipidemia: Resume home dose of statin.  DVT prophylaxis: Lovenox Code Status: DNR Family Communication: None present at bedside.  Plan of care discussed with patient in length and he verbalized understanding and agreed with it. Disposition Plan: Likely home tomorrow Consults called: None Admission status: Observation   Hughie Clossavi Crysta Gulick MD [facility]  06/28/2019,  4:13 PM  To contact the attending provider between 7A-7P or the covering provider during after hours 7P-7A, please log into the web site [email or web address] and use password [email or web address].

## 2019-06-28 NOTE — ED Notes (Addendum)
Attempted blood draw x1 in right AC without success.

## 2019-06-29 DIAGNOSIS — I959 Hypotension, unspecified: Secondary | ICD-10-CM | POA: Diagnosis not present

## 2019-06-29 DIAGNOSIS — R55 Syncope and collapse: Secondary | ICD-10-CM | POA: Diagnosis not present

## 2019-06-29 LAB — COMPREHENSIVE METABOLIC PANEL
ALT: 19 U/L (ref 0–44)
AST: 26 U/L (ref 15–41)
Albumin: 2.8 g/dL — ABNORMAL LOW (ref 3.5–5.0)
Alkaline Phosphatase: 59 U/L (ref 38–126)
Anion gap: 10 (ref 5–15)
BUN: 46 mg/dL — ABNORMAL HIGH (ref 8–23)
CO2: 21 mmol/L — ABNORMAL LOW (ref 22–32)
Calcium: 9.8 mg/dL (ref 8.9–10.3)
Chloride: 108 mmol/L (ref 98–111)
Creatinine, Ser: 1.07 mg/dL — ABNORMAL HIGH (ref 0.44–1.00)
GFR calc Af Amer: 53 mL/min — ABNORMAL LOW (ref >60)
GFR calc non Af Amer: 46 mL/min — ABNORMAL LOW (ref >60)
Glucose, Bld: 109 mg/dL — ABNORMAL HIGH (ref 70–99)
Potassium: 4.6 mmol/L (ref 3.5–5.1)
Sodium: 139 mmol/L (ref 135–145)
Total Bilirubin: 0.4 mg/dL (ref 0.3–1.2)
Total Protein: 6.3 g/dL — ABNORMAL LOW (ref 6.5–8.1)

## 2019-06-29 LAB — CBC
HCT: 28.5 % — ABNORMAL LOW (ref 36.0–46.0)
Hemoglobin: 9 g/dL — ABNORMAL LOW (ref 12.0–15.0)
MCH: 31.9 pg (ref 26.0–34.0)
MCHC: 31.6 g/dL (ref 30.0–36.0)
MCV: 101.1 fL — ABNORMAL HIGH (ref 80.0–100.0)
Platelets: 222 10*3/uL (ref 150–400)
RBC: 2.82 MIL/uL — ABNORMAL LOW (ref 3.87–5.11)
RDW: 12.7 % (ref 11.5–15.5)
WBC: 6.3 10*3/uL (ref 4.0–10.5)
nRBC: 0 % (ref 0.0–0.2)

## 2019-06-29 MED ORDER — METOPROLOL TARTRATE 25 MG PO TABS: 12.5000 mg | ORAL_TABLET | Freq: Two times a day (BID) | ORAL | 0 refills | Status: DC

## 2019-06-29 MED ORDER — METOPROLOL TARTRATE 12.5 MG HALF TABLET: 12.5000 mg | ORAL_TABLET | Freq: Every day | ORAL | Status: DC

## 2019-06-29 NOTE — Discharge Summary (Signed)
Physician Discharge Summary  Bonnie Billetovella Pickrel UEA:[id number] DOB: 05/05/1928 DOA: 06/28/2019  PCP: Andi DevonShelton, Kimberly, MD  Admit date: 06/28/2019 Discharge date: 06/29/2019  Admitted From: home Discharge disposition: home   Recommendations for Outpatient Follow-Up:   1. BP medications adjusted, patient says she has lost weight so may not need as much 2. Home health-- patient declined SNF   Discharge Diagnosis:   Active Problems:   CORONARY ATHEROSCLEROSIS NATIVE CORONARY ARTERY   Near syncope   Dehydration   Hypotension    Discharge Condition: Improved.  Diet recommendation: Low sodium, heart healthy  Wound care: None.  Code status: Full.   History of Present Illness:   Bonnie Morton is a 85 y.o. female with medical history significant of CAD, hypertension, hyperlipidemia and iron deficiency anemia was brought into the ED due to dizziness and hypotension.  According to patient, she was in her normal state of health when she woke up this morning.  She sat down to have her breakfast around 10 with her family and then she suddenly she did not feel well.  She told her family members, she was noted to have diaphoresis and she felt hot and dizzy.  She did not lose her consciousness during the event..  According to her, her brother-in-law checked her blood pressure and it was " low", she does not remember the numbers.  Since she was not looking good to family members, they called EMS.  When EMS arrived, her blood pressure was 58/32 and her blood sugar was 192.  She was escorted to the emergency department.  She was given IV fluids in route.  Upon arrival to ED, her initial blood pressure was 104/52.  She had no other complaints such as chest pain, shortness of breath or any other complaint.   Hospital Course by Problem:     Near syncope/hypotension/dehydration: According to her, her blood pressure usually runs high.  She drinks about 1.5 L of fluid every day.  On  examination, she looks dry and dehydrated.  Her blood pressure was low likely secondary to dehydration and she is on couple of antihypertensives as well.  -BP improved with holding of her BP medications and normal saline  -no valvular heart disease based on her symptoms so no transthoracic echo  Hyperlipidemia: Resume home dose of statin.  Home health PT/OT and RN--- for home BP checks  Medical Consultants:      Discharge Exam:   Vitals:   06/29/19 0503 06/29/19 1239  BP: (!) 116/52 130/72  Pulse: (!) 52 (!) 52  Resp: 18 16  Temp: 98.4 F (36.9 C) 98.2 F (36.8 C)  SpO2: 100% 90%   Vitals:   06/28/19 1842 06/28/19 2137 06/29/19 0503 06/29/19 1239  BP: (!) 133/55 (!) 117/56 (!) 116/52 130/72  Pulse: 65 61 (!) 52 (!) 52  Resp: 18  18 16   Temp: 98.9 F (37.2 C) 98.3 F (36.8 C) 98.4 F (36.9 C) 98.2 F (36.8 C)  TempSrc: Oral Oral Oral Oral  SpO2: 100% 99% 100% 90%  Weight:   53.9 kg   Height:        General exam: Appears calm and comfortable.   The results of significant diagnostics from this hospitalization (including imaging, microbiology, ancillary and laboratory) are listed below for reference.     Procedures and Diagnostic Studies:   Dg Chest 2 View  Result Date: 06/28/2019 CLINICAL DATA:  Weakness and hypotension. EXAM: CHEST - 2 VIEW COMPARISON:  12/25/2017 and 08/01/2011  FINDINGS: Chronic cardiomegaly. Pulmonary vascularity is normal. Aortic atherosclerosis. No infiltrates or effusions. Chronic elevation of the right hemidiaphragm. No acute bone abnormality. IMPRESSION: 1. No acute abnormalities. 2. Chronic cardiomegaly. 3. Aortic atherosclerosis. Electronically Signed   By: Francene Boyers M.D.   On: 06/28/2019 12:55     Labs:   Basic Metabolic Panel: Recent Labs  Lab 06/28/19 1259 06/29/19 0455  NA 139 139  K 4.1 4.6  CL 106 108  CO2 20* 21*  GLUCOSE 169* 109*  BUN 47* 46*  CREATININE 1.04* 1.07*  CALCIUM 10.3 9.8   GFR Estimated  Creatinine Clearance: 29.5 mL/min (A) (by C-G formula based on SCr of 1.07 mg/dL (H)). Liver Function Tests: Recent Labs  Lab 06/28/19 1259 06/29/19 0455  AST 31 26  ALT 18 19  ALKPHOS 63 59  BILITOT 1.0 0.4  PROT 7.5 6.3*  ALBUMIN 3.4* 2.8*   No results for input(s): LIPASE, AMYLASE in the last 168 hours. No results for input(s): AMMONIA in the last 168 hours. Coagulation profile No results for input(s): INR, PROTIME in the last 168 hours.  CBC: Recent Labs  Lab 06/28/19 1259 06/29/19 0455  WBC 8.5 6.3  NEUTROABS 6.8  --   HGB 10.7* 9.0*  HCT 34.2* 28.5*  MCV 104.0* 101.1*  PLT 235 222   Cardiac Enzymes: No results for input(s): CKTOTAL, CKMB, CKMBINDEX, TROPONINI in the last 168 hours. BNP: Invalid input(s): POCBNP CBG: No results for input(s): GLUCAP in the last 168 hours. D-Dimer No results for input(s): DDIMER in the last 72 hours. Hgb A1c No results for input(s): HGBA1C in the last 72 hours. Lipid Profile No results for input(s): CHOL, HDL, LDLCALC, TRIG, CHOLHDL, LDLDIRECT in the last 72 hours. Thyroid function studies Recent Labs    06/28/19 1259  TSH 1.558   Anemia work up No results for input(s): VITAMINB12, FOLATE, FERRITIN, TIBC, IRON, RETICCTPCT in the last 72 hours. Microbiology Recent Results (from the past 240 hour(s))  SARS CORONAVIRUS 2 (TAT 6-24 HRS) Nasopharyngeal Nasopharyngeal Swab     Status: None   Collection Time: 06/28/19  3:03 PM   Specimen: Nasopharyngeal Swab  Result Value Ref Range Status   SARS Coronavirus 2 NEGATIVE NEGATIVE Final    Comment: (NOTE) SARS-CoV-2 target nucleic acids are NOT DETECTED. The SARS-CoV-2 RNA is generally detectable in upper and lower respiratory specimens during the acute phase of infection. Negative results do not preclude SARS-CoV-2 infection, do not rule out co-infections with other pathogens, and should not be used as the sole basis for treatment or other patient management  decisions. Negative results must be combined with clinical observations, patient history, and epidemiological information. The expected result is Negative. Fact Sheet for Patients: HairSlick.no Fact Sheet for Healthcare Providers: quierodirigir.com This test is not yet approved or cleared by the [place] FDA and  has been authorized for detection and/or diagnosis of SARS-CoV-2 by FDA under an Emergency Use Authorization (EUA). This EUA will remain  in effect (meaning this test can be used) for the duration of the COVID-19 declaration under Section 56 4(b)(1) of the Act, 21 U.S.C. section 360bbb-3(b)(1), unless the authorization is terminated or revoked sooner. Performed at [facility], [address], [place], [place] [address]      Discharge Instructions:   Discharge Instructions    Diet - low sodium heart healthy   Complete by: As directed    Discharge instructions   Complete by: As directed    Home health  Increase activity slowly   Complete by: As directed      Allergies as of 06/29/2019   No Known Allergies     Medication List    STOP taking these medications   amLODipine 2.5 MG tablet Commonly known as: NORVASC   nitroGLYCERIN 0.4 MG SL tablet Commonly known as: NITROSTAT   potassium chloride 10 MEQ tablet Commonly known as: KLOR-CON   valsartan-hydrochlorothiazide 160-25 MG tablet Commonly known as: DIOVAN-HCT     TAKE these medications   acetaminophen 500 MG tablet Commonly known as: TYLENOL Take 1,000 mg by mouth as needed for mild pain or headache.   aspirin 81 MG tablet Take 81 mg by mouth daily.   atorvastatin 10 MG tablet Commonly known as: LIPITOR Take 10 mg by mouth daily.   brimonidine 0.2 % ophthalmic solution Commonly known as: ALPHAGAN Place 1 drop into both eyes every 8 (eight) hours.   clopidogrel 75 MG tablet Commonly known as: PLAVIX Take 1 tablet (75 mg  total) by mouth daily.   CORICIDIN HBP FLU PO Take 1 tablet by mouth as needed (flu symptoms).   dorzolamide-timolol 22.3-6.8 MG/ML ophthalmic solution Commonly known as: COSOPT Place 1 drop into both eyes 2 (two) times daily.   metoprolol tartrate 25 MG tablet Commonly known as: LOPRESSOR Take 0.5 tablets (12.5 mg total) by mouth 2 (two) times daily. What changed:   how much to take  when to take this   SENNA-EX PO Take 1 tablet by mouth daily as needed (constipation).            Durable Medical Equipment  (From admission, onward)         Start     Ordered   06/29/19 1327  For home use only DME lightweight manual wheelchair with seat cushion  Once    Comments: Patient suffers from  Near syncope which impairs their ability to perform daily activities like ambulating  in the home.  A cane  will not resolve  issue with performing activities of daily living. A wheelchair will allow patient to safely perform daily activities. Patient is not able to propel themselves in the home using a standard weight wheelchair due to  Near syncope. Patient can self propel in the lightweight wheelchair. Length of need lifetime . Accessories: elevating leg rests (ELRs), wheel locks, extensions and anti-tippers.   Seat and back cushions   06/29/19 1327   06/29/19 1321  For home use only DME lightweight manual wheelchair with seat cushion  Once    Comments: Patient suffers from weakness which impairs their ability to perform daily activities like feeding in the home.  A cane will not resolve  issue with performing activities of daily living. A wheelchair will allow patient to safely perform daily activities. Patient is not able to propel themselves in the home using a standard weight wheelchair due to general weakness. Patient can self propel in the lightweight wheelchair. Length of need Lifetime. Accessories: elevating leg rests (ELRs), wheel locks, extensions and anti-tippers.   06/29/19 1320    06/29/19 1203  For home use only DME Walker rolling  Once    Question:  Patient needs a walker to treat with the following condition  Answer:  Weakness   06/29/19 1203         Follow-up Information    Andi Devon, MD Follow up.   Specialty: Internal Medicine Why: for BP medication management Contact information: [address] [place] [address] [phone]  Care, [facility] Follow up.   Specialty: Home Health Services Contact information: [place] [address] [phone]            Time coordinating discharge: 25 min  Signed:  Geradine Girt DO  [facility] 06/29/2019, 4:24 PM

## 2019-06-29 NOTE — TOC Initial Note (Addendum)
Transition of Care [facility]) - Initial/Assessment Note    Patient Details  Name: Bonnie Morton MRN: [id number] Date of Birth: 06-Oct-1927  [facility] CM/SW Contact:    Marilu Favre, RN Phone Number: 06/29/2019, 1:02 PM  Clinical Narrative:                 Confirmed face sheet information with patient at bedside. Patient consented for NCM to call niece Mellanie [phone].   NCM spoke to both patient and Mellanie . Patient lives with her two sisters and one brother in Sports coach. Mellanie lives close by and assists as needed.   Both voiced understanding that PT recommendation is for SNF. Both decline SNF at this time and prefer home health. They have no preference in home health agency.   Referral given to and accepted by [facility] with Alvis Lemmings.   Patient and Mellanie requesting [facility] transport home.   PT recommending rolling walker. Patient and Mellanie aware , but decline same. Patient has a Corporate investment banker at home.   Mellaine requesting wheel chair for home. Sent message to PT and MD to see if same recommended. Mellanie aware. Mellanie also aware if wheel chair ordered [facility] will not transport. NCM will give [employer] Mellanie's direct number and she can have wheel chair delivered or she can pick up.   Awaiting to hear back from PT regarding wheel chair.   PT is recommending a wheel chair for home. Same ordered gave Zack with [place] phone number   Expected Discharge Plan: [place] Barriers to Discharge: No Barriers Identified   Patient Goals and CMS Choice Patient states their goals for this hospitalization and ongoing recovery are:: to go home CMS Medicare.gov Compare Post Acute Care list provided to:: Patient Choice offered to / list presented to : Patient  Expected Discharge Plan and Services Expected Discharge Plan: [place]   Discharge Planning Services: CM Consult Post Acute Care Choice: [place] arrangements for  the past 2 months: Single Family Home Expected Discharge Date: 06/29/19               DME Arranged: N/A         HH Arranged: RN, PT, OT, Nurse's Aide, Social Work [employer] Agency: [place] Date [facility] Agency Contacted: 06/29/19 Time [place]: 33 Representative spoke with at [place]: [place] Arrangements/Services Living arrangements for the past 2 months: [place] Lives with:: Relatives Patient language and need for interpreter reviewed:: Yes Do you feel safe going back to the place where you live?: Yes      Need for Family Participation in Patient Care: Yes (Comment) Care giver support system in place?: Yes (comment) Current home services: DME Criminal Activity/Legal Involvement Pertinent to Current Situation/Hospitalization: No - Comment as needed  Activities of Daily Living      Permission Sought/Granted Permission sought to share information with : Family Supports Permission granted to share information with : Yes, Verbal Permission Granted  Share Information with NAME: Darlyn Read [phone]  niece           Emotional Assessment Appearance:: Appears stated age Attitude/Demeanor/Rapport: Engaged Affect (typically observed): Accepting Orientation: : Oriented to Self, Oriented to Place, Oriented to  Time, Oriented to Situation Alcohol / Substance Use: Not Applicable Psych Involvement: No (comment)  Admission diagnosis:  Near syncope [R55] Hypotension, unspecified hypotension type [I95.9] Patient Active Problem List   Diagnosis Date  Noted  . Near syncope 06/28/2019  . Dehydration 06/28/2019  . Hypotension 06/28/2019  . CAD (coronary artery disease)   . Chest pain 12/25/2017  . Non-ST elevation (NSTEMI) myocardial infarction (HCC)   . Edema of lower extremity 08/05/2011  . Ulcer, peptic, acute or chronic 08/03/2011  . Anemia 08/01/2011  . Constipation 08/01/2011  . Hyperlipidemia 03/30/2011  . Essential  hypertension 01/10/2010  . CORONARY ATHEROSCLEROSIS NATIVE CORONARY ARTERY 01/10/2010   PCP:  Andi Devon, MD Pharmacy:   [facility] [address] [place]), [place] - [address] [place] [address] Phone: [phone] Fax: [phone]     Social Determinants of Health (SDOH) Interventions    Readmission Risk Interventions No flowsheet data found.

## 2019-06-29 NOTE — Evaluation (Addendum)
Physical Therapy Evaluation Patient Details Name: Bonnie Morton MRN: [id number] DOB: February 23, 1928 Today's Date: 06/29/2019   History of Present Illness  Pt is an 85 y/o female admitted secondary to experiencing a near syncopal episode at home. Pt found to be hypotensive and dehydrated. PMH including but not limited to CAD, HTN and glaucoma.    Clinical Impression  Pt presented supine in bed with HOB elevated, awake and willing to participate in therapy session. Prior to admission, pt originally stating that she was independent with all mobility and ADLs. However, later during the session pt admitting that she has only been "scooting" from bed to [facility] at home with someone providing standby assist for safety. Pt greatly limited this session secondary to fatigue, weakness and L ankle pain. Pt with no reports of dizziness throughout. BP was assessed and remained stable. Pt would continue to benefit from skilled physical therapy services at this time while admitted and after d/c to address the below listed limitations in order to improve overall safety and independence with functional mobility.  BP supine = 125/61 mmHg BP in sitting = 138/79 mmHg (pt unable to stand long enough to assess BP in standing)     Follow Up Recommendations SNF;Other (comment)(if pt refuses, will need 24/7 physical assist and HHPT)    Equipment Recommendations  Rolling walker with 5" wheels;Wheelchair (measurements PT);Wheelchair cushion (measurements PT)    Recommendations for Other Services       Precautions / Restrictions Precautions Precautions: Fall Restrictions Weight Bearing Restrictions: No      Mobility  Bed Mobility Overal bed mobility: Needs Assistance Bed Mobility: Supine to Sit     Supine to sit: Min guard     General bed mobility comments: HOB elevated, min guard for safety; no physical assistance needed  Transfers Overall transfer level: Needs assistance Equipment used: Rolling walker (2  wheeled) Transfers: Sit to/from Stand Sit to Stand: Mod assist         General transfer comment: increased time and effort needed, cueing for safe hand placement, attempted initially without providing any physical assist but pt unable to achieve standing position; on second attempt pt required mod A to stand from EOB  Ambulation/Gait             General Gait Details: unable  Stairs            Wheelchair Mobility    Modified Rankin (Stroke Patients Only)       Balance Overall balance assessment: Needs assistance Sitting-balance support: Feet supported Sitting balance-Leahy Scale: Good     Standing balance support: Bilateral upper extremity supported Standing balance-Leahy Scale: Poor Standing balance comment: pt only able to tolerate standing position for ~30 seconds prior to needing to return to sitting secondary to L ankle pain                             Pertinent Vitals/Pain Pain Assessment: Faces Faces Pain Scale: Hurts little more Pain Location: L ankle Pain Descriptors / Indicators: Sore Pain Intervention(s): Monitored during session    Home Living Family/patient expects to be discharged to:: Private residence Living Arrangements: Other relatives;Other (Comment)(going to live with her neice and neice's husband) Available Help at Discharge: Family;Available 24 hours/day Type of Home: House Home Access: Stairs to enter Entrance Stairs-Rails: Right Entrance Stairs-Number of Steps: 5 Home Layout: One level Home Equipment: Cane - single point;Bedside commode      Prior Function Level of Independence:  Independent         Comments: pt initially stating that she was independent with ambulation and ADLs; however, then reporting that she has recently only been "scooting" from her bed to a BSC secondary to L ankle pain and her R knee "giving out" on her     Hand Dominance        Extremity/Trunk Assessment   Upper Extremity  Assessment Upper Extremity Assessment: Generalized weakness    Lower Extremity Assessment Lower Extremity Assessment: Generalized weakness    Cervical / Trunk Assessment Cervical / Trunk Assessment: Kyphotic  Communication   Communication: No difficulties  Cognition Arousal/Alertness: Awake/alert Behavior During Therapy: WFL for tasks assessed/performed Overall Cognitive Status: Impaired/Different from baseline Area of Impairment: Safety/judgement;Problem solving                         Safety/Judgement: Decreased awareness of deficits   Problem Solving: Difficulty sequencing;Requires verbal cues        General Comments      Exercises     Assessment/Plan    PT Assessment Patient needs continued PT services  PT Problem List Decreased strength;Decreased range of motion;Decreased activity tolerance;Decreased balance;Decreased mobility;Decreased coordination;Decreased knowledge of use of DME;Decreased safety awareness;Decreased knowledge of precautions;Pain       PT Treatment Interventions DME instruction;Gait training;Stair training;Functional mobility training;Therapeutic activities;Therapeutic exercise;Balance training;Neuromuscular re-education;Patient/family education    PT Goals (Current goals can be found in the Care Plan section)  Acute Rehab PT Goals Patient Stated Goal: adamant about returning home with family PT Goal Formulation: With patient Time For Goal Achievement: 07/13/19 Potential to Achieve Goals: Fair    Frequency Min 2X/week   Barriers to discharge        Co-evaluation               AM-PAC PT "6 Clicks" Mobility  Outcome Measure Help needed turning from your back to your side while in a flat bed without using bedrails?: A Little Help needed moving from lying on your back to sitting on the side of a flat bed without using bedrails?: A Little Help needed moving to and from a bed to a chair (including a wheelchair)?: A Lot Help  needed standing up from a chair using your arms (e.g., wheelchair or bedside chair)?: A Lot Help needed to walk in hospital room?: Total Help needed climbing 3-5 steps with a railing? : Total 6 Click Score: 12    End of Session Equipment Utilized During Treatment: Gait belt Activity Tolerance: Patient limited by pain Patient left: in bed;with call bell/phone within reach;with bed alarm set;Other (comment)(seated EOB) Nurse Communication: Mobility status PT Visit Diagnosis: Other abnormalities of gait and mobility (R26.89);Muscle weakness (generalized) (M62.81);Pain Pain - Right/Left: Left Pain - part of body: Ankle and joints of foot    Time: 0941-1010 PT Time Calculation (min) (ACUTE ONLY): 29 min   Charges:   PT Evaluation $PT Eval Moderate Complexity: 1 Mod PT Treatments $Therapeutic Activity: 8-22 mins        Deborah Chalk, PT, DPT  Acute Rehabilitation Services Pager [phone] Office [phone]    Alessandra Bevels Leanza Shepperson 06/29/2019, 1:17 PM

## 2019-06-29 NOTE — Care Management (Signed)
    Durable Medical Equipment  (From admission, onward)         Start     Ordered   06/29/19 1327  For home use only DME lightweight manual wheelchair with seat cushion  Once    Comments: Patient suffers from  Near syncope which impairs their ability to perform daily activities like ambulating  in the home.  A cane  will not resolve  issue with performing activities of daily living. A wheelchair will allow patient to safely perform daily activities. Patient is not able to propel themselves in the home using a standard weight wheelchair due to  Near syncope. Patient can self propel in the lightweight wheelchair. Length of need lifetime . Accessories: elevating leg rests (ELRs), wheel locks, extensions and anti-tippers.   Seat and back cushions   06/29/19 1327   06/29/19 1321  For home use only DME lightweight manual wheelchair with seat cushion  Once    Comments: Patient suffers from weakness which impairs their ability to perform daily activities like feeding in the home.  A cane will not resolve  issue with performing activities of daily living. A wheelchair will allow patient to safely perform daily activities. Patient is not able to propel themselves in the home using a standard weight wheelchair due to general weakness. Patient can self propel in the lightweight wheelchair. Length of need Lifetime. Accessories: elevating leg rests (ELRs), wheel locks, extensions and anti-tippers.   06/29/19 1320   06/29/19 1203  For home use only DME Walker rolling  Once    Question:  Patient needs a walker to treat with the following condition  Answer:  Weakness   06/29/19 1203

## 2020-04-04 ENCOUNTER — Encounter (HOSPITAL_COMMUNITY): Payer: Self-pay

## 2020-04-04 ENCOUNTER — Other Ambulatory Visit: Payer: Self-pay

## 2020-04-04 ENCOUNTER — Inpatient Hospital Stay (HOSPITAL_COMMUNITY)
Admission: EM | Admit: 2020-04-04 | Discharge: 2020-04-07 | DRG: 313 | Disposition: A | Discharge status: Home Health | Payer: Medicare Other | Attending: Internal Medicine | Admitting: Internal Medicine

## 2020-04-04 ENCOUNTER — Emergency Department (HOSPITAL_COMMUNITY): Payer: Medicare Other

## 2020-04-04 DIAGNOSIS — I454 Nonspecific intraventricular block: Secondary | ICD-10-CM | POA: Diagnosis present

## 2020-04-04 DIAGNOSIS — Z79899 Other long term (current) drug therapy: Secondary | ICD-10-CM

## 2020-04-04 DIAGNOSIS — W19XXXA Unspecified fall, initial encounter: Secondary | ICD-10-CM | POA: Diagnosis present

## 2020-04-04 DIAGNOSIS — I252 Old myocardial infarction: Secondary | ICD-10-CM

## 2020-04-04 DIAGNOSIS — R778 Other specified abnormalities of plasma proteins: Secondary | ICD-10-CM | POA: Diagnosis present

## 2020-04-04 DIAGNOSIS — Z20822 Contact with and (suspected) exposure to covid-19: Secondary | ICD-10-CM | POA: Diagnosis present

## 2020-04-04 DIAGNOSIS — R079 Chest pain, unspecified: Secondary | ICD-10-CM

## 2020-04-04 DIAGNOSIS — M11261 Other chondrocalcinosis, right knee: Secondary | ICD-10-CM | POA: Diagnosis present

## 2020-04-04 DIAGNOSIS — K5900 Constipation, unspecified: Secondary | ICD-10-CM | POA: Diagnosis present

## 2020-04-04 DIAGNOSIS — E860 Dehydration: Secondary | ICD-10-CM | POA: Diagnosis present

## 2020-04-04 DIAGNOSIS — H409 Unspecified glaucoma: Secondary | ICD-10-CM | POA: Diagnosis present

## 2020-04-04 DIAGNOSIS — Z801 Family history of malignant neoplasm of trachea, bronchus and lung: Secondary | ICD-10-CM

## 2020-04-04 DIAGNOSIS — D509 Iron deficiency anemia, unspecified: Secondary | ICD-10-CM | POA: Diagnosis present

## 2020-04-04 DIAGNOSIS — R262 Difficulty in walking, not elsewhere classified: Secondary | ICD-10-CM

## 2020-04-04 DIAGNOSIS — M25461 Effusion, right knee: Secondary | ICD-10-CM | POA: Diagnosis present

## 2020-04-04 DIAGNOSIS — Z66 Do not resuscitate: Secondary | ICD-10-CM | POA: Diagnosis present

## 2020-04-04 DIAGNOSIS — I2510 Atherosclerotic heart disease of native coronary artery without angina pectoris: Secondary | ICD-10-CM | POA: Diagnosis present

## 2020-04-04 DIAGNOSIS — R0789 Other chest pain: Secondary | ICD-10-CM | POA: Diagnosis not present

## 2020-04-04 DIAGNOSIS — Z7902 Long term (current) use of antithrombotics/antiplatelets: Secondary | ICD-10-CM

## 2020-04-04 DIAGNOSIS — E785 Hyperlipidemia, unspecified: Secondary | ICD-10-CM | POA: Diagnosis present

## 2020-04-04 DIAGNOSIS — Z8249 Family history of ischemic heart disease and other diseases of the circulatory system: Secondary | ICD-10-CM

## 2020-04-04 DIAGNOSIS — Z8711 Personal history of peptic ulcer disease: Secondary | ICD-10-CM

## 2020-04-04 DIAGNOSIS — Z955 Presence of coronary angioplasty implant and graft: Secondary | ICD-10-CM

## 2020-04-04 DIAGNOSIS — I10 Essential (primary) hypertension: Secondary | ICD-10-CM | POA: Diagnosis present

## 2020-04-04 DIAGNOSIS — Z90710 Acquired absence of both cervix and uterus: Secondary | ICD-10-CM

## 2020-04-04 LAB — CBC
HCT: 35.4 % — ABNORMAL LOW (ref 36.0–46.0)
Hemoglobin: 11.3 g/dL — ABNORMAL LOW (ref 12.0–15.0)
MCH: 31.1 pg (ref 26.0–34.0)
MCHC: 31.9 g/dL (ref 30.0–36.0)
MCV: 97.5 fL (ref 80.0–100.0)
Platelets: 214 10*3/uL (ref 150–400)
RBC: 3.63 MIL/uL — ABNORMAL LOW (ref 3.87–5.11)
RDW: 12.9 % (ref 11.5–15.5)
WBC: 9.3 10*3/uL (ref 4.0–10.5)
nRBC: 0 % (ref 0.0–0.2)

## 2020-04-04 LAB — HEPATIC FUNCTION PANEL
ALT: 24 U/L (ref 0–44)
AST: 51 U/L — ABNORMAL HIGH (ref 15–41)
Albumin: 2.4 g/dL — ABNORMAL LOW (ref 3.5–5.0)
Alkaline Phosphatase: 66 U/L (ref 38–126)
Bilirubin, Direct: 0.3 mg/dL — ABNORMAL HIGH (ref 0.0–0.2)
Indirect Bilirubin: 1 mg/dL — ABNORMAL HIGH (ref 0.3–0.9)
Total Bilirubin: 1.3 mg/dL — ABNORMAL HIGH (ref 0.3–1.2)
Total Protein: 6 g/dL — ABNORMAL LOW (ref 6.5–8.1)

## 2020-04-04 LAB — BASIC METABOLIC PANEL
Anion gap: 9 (ref 5–15)
BUN: 20 mg/dL (ref 8–23)
CO2: 25 mmol/L (ref 22–32)
Calcium: 9 mg/dL (ref 8.9–10.3)
Chloride: 105 mmol/L (ref 98–111)
Creatinine, Ser: 0.87 mg/dL (ref 0.44–1.00)
GFR calc Af Amer: >60 mL/min (ref >60)
GFR calc non Af Amer: 59 mL/min — ABNORMAL LOW (ref >60)
Glucose, Bld: 159 mg/dL — ABNORMAL HIGH (ref 70–99)
Potassium: 3.7 mmol/L (ref 3.5–5.1)
Sodium: 139 mmol/L (ref 135–145)

## 2020-04-04 LAB — TROPONIN I (HIGH SENSITIVITY): Troponin I (High Sensitivity): 68 ng/L — ABNORMAL HIGH (ref <18)

## 2020-04-04 LAB — LIPASE, BLOOD: Lipase: 36 U/L (ref 11–51)

## 2020-04-04 LAB — URIC ACID: Uric Acid, Serum: 5.6 mg/dL (ref 2.5–7.1)

## 2020-04-04 IMAGING — DX DG HIP (WITH OR WITHOUT PELVIS) 2-3V*R*
3 series · 3 of 3 positions shown · non-contrast
Comparison: Right hip series 11/29/2006.

CLINICAL DATA: [AGE] female with knee and hip pain. No known
injury.

EXAM:
DG HIP (WITH OR WITHOUT PELVIS) 2-3V RIGHT

[pelvis ap]
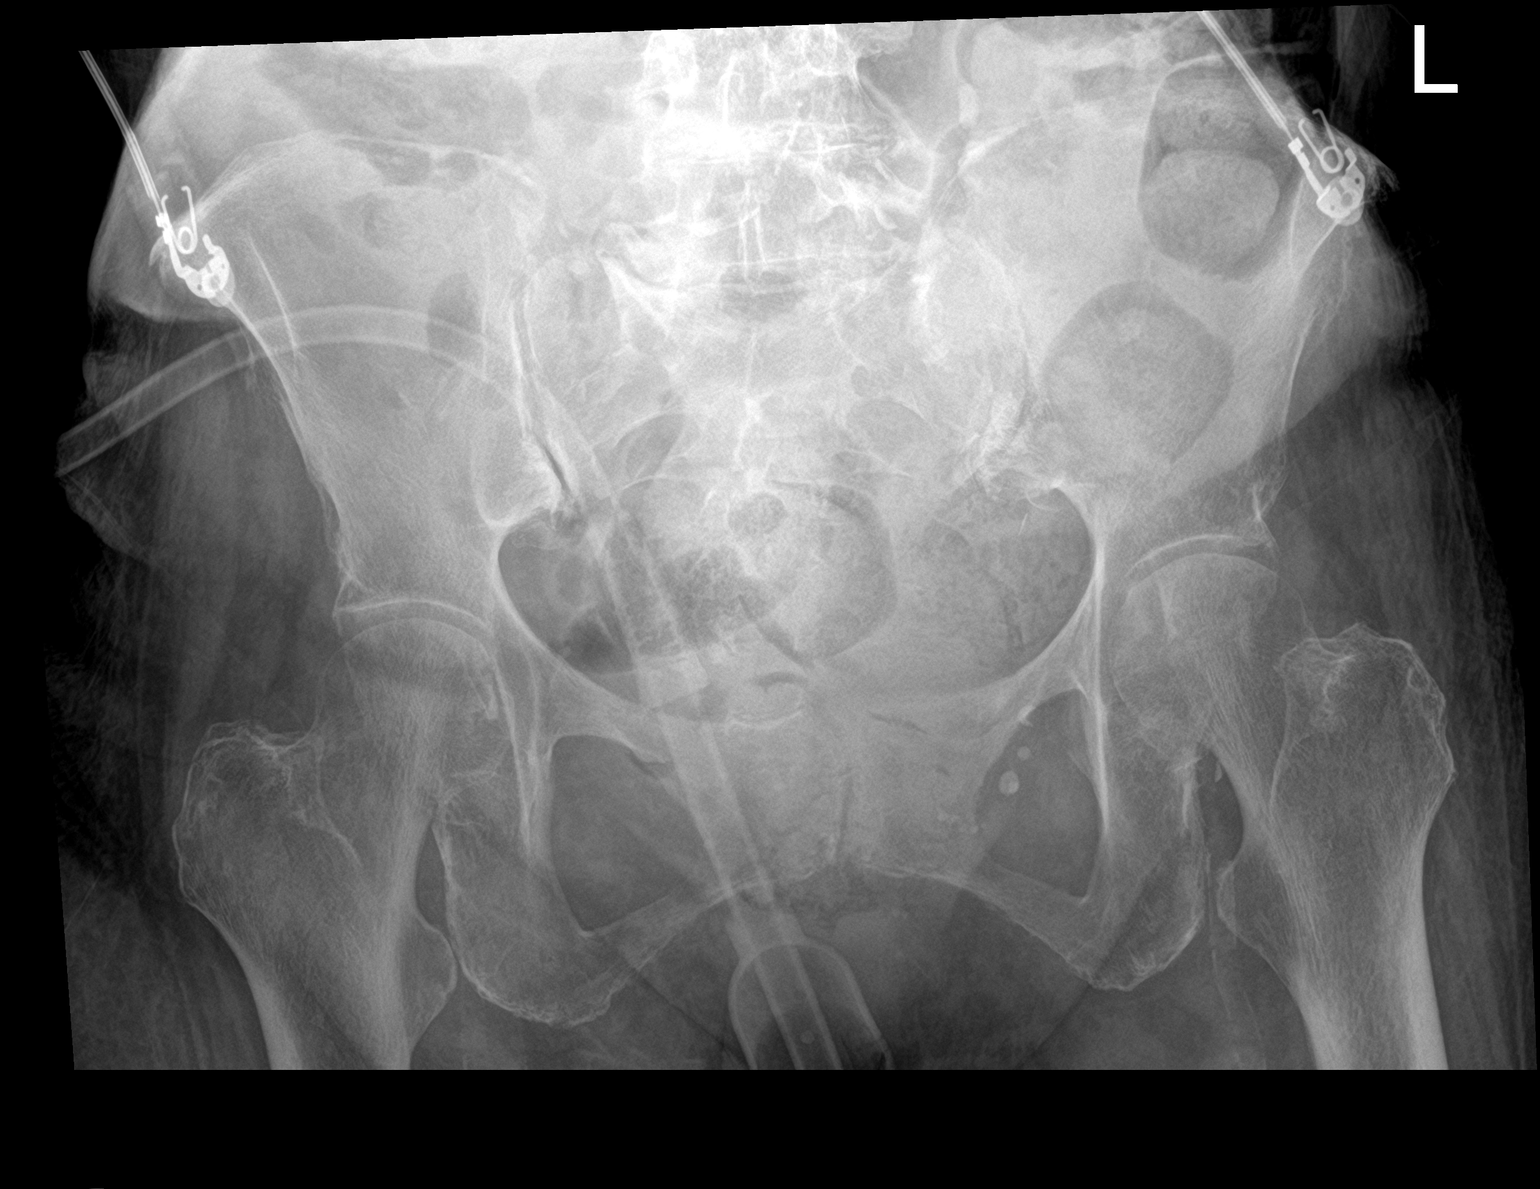

[hip ap]
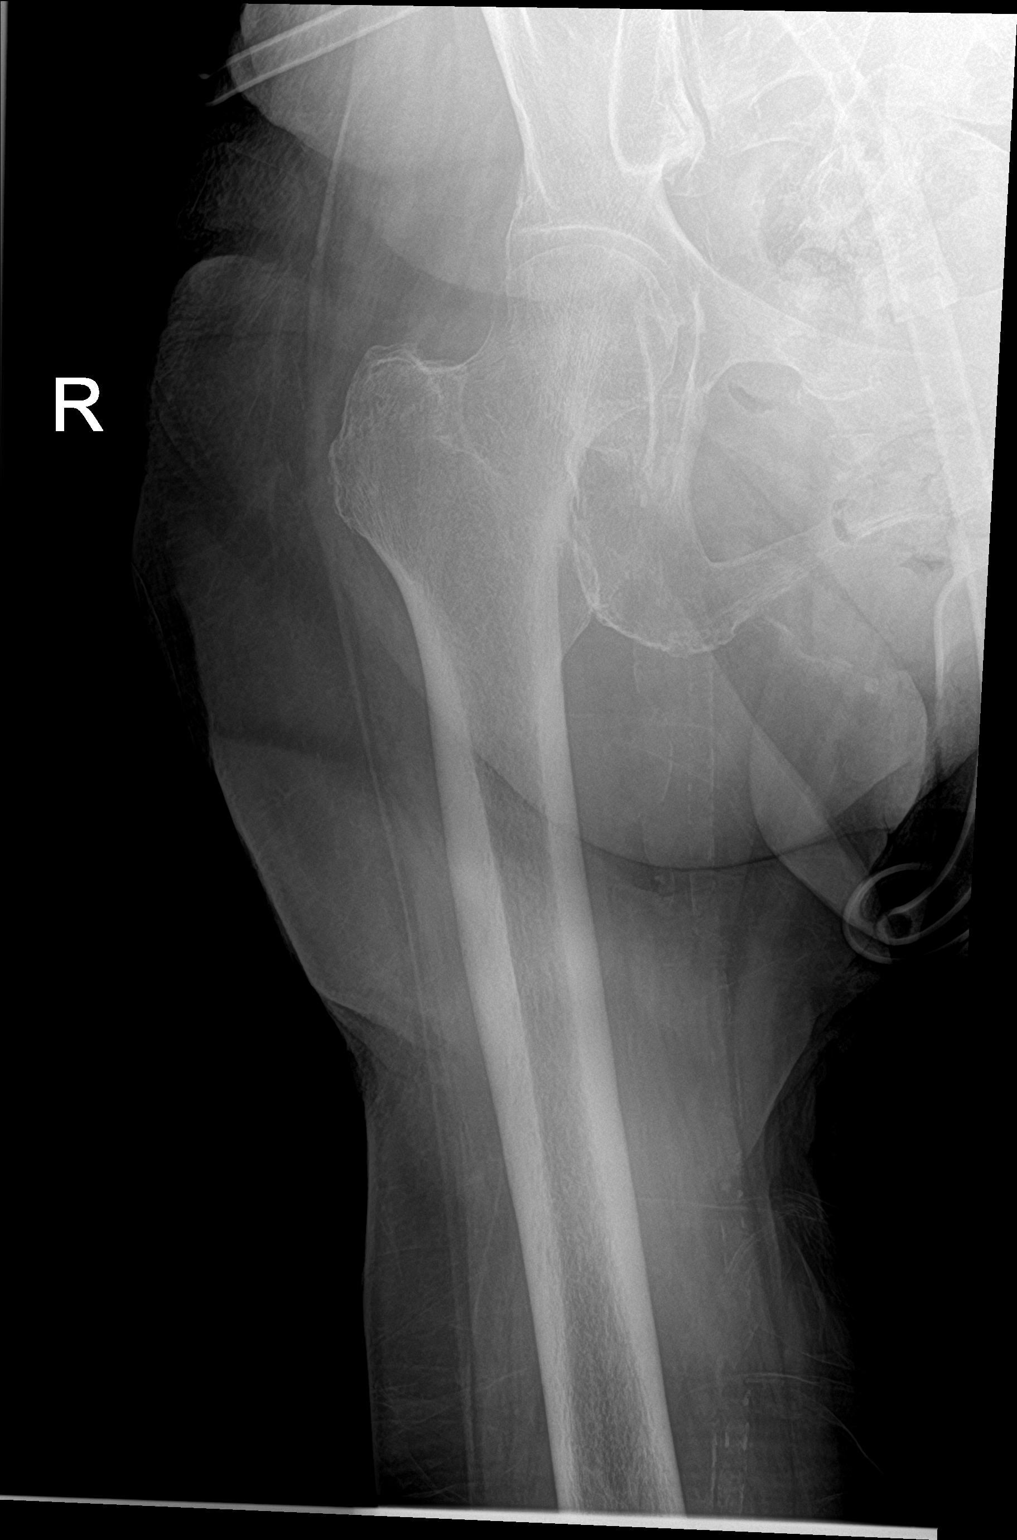

[hip lat]
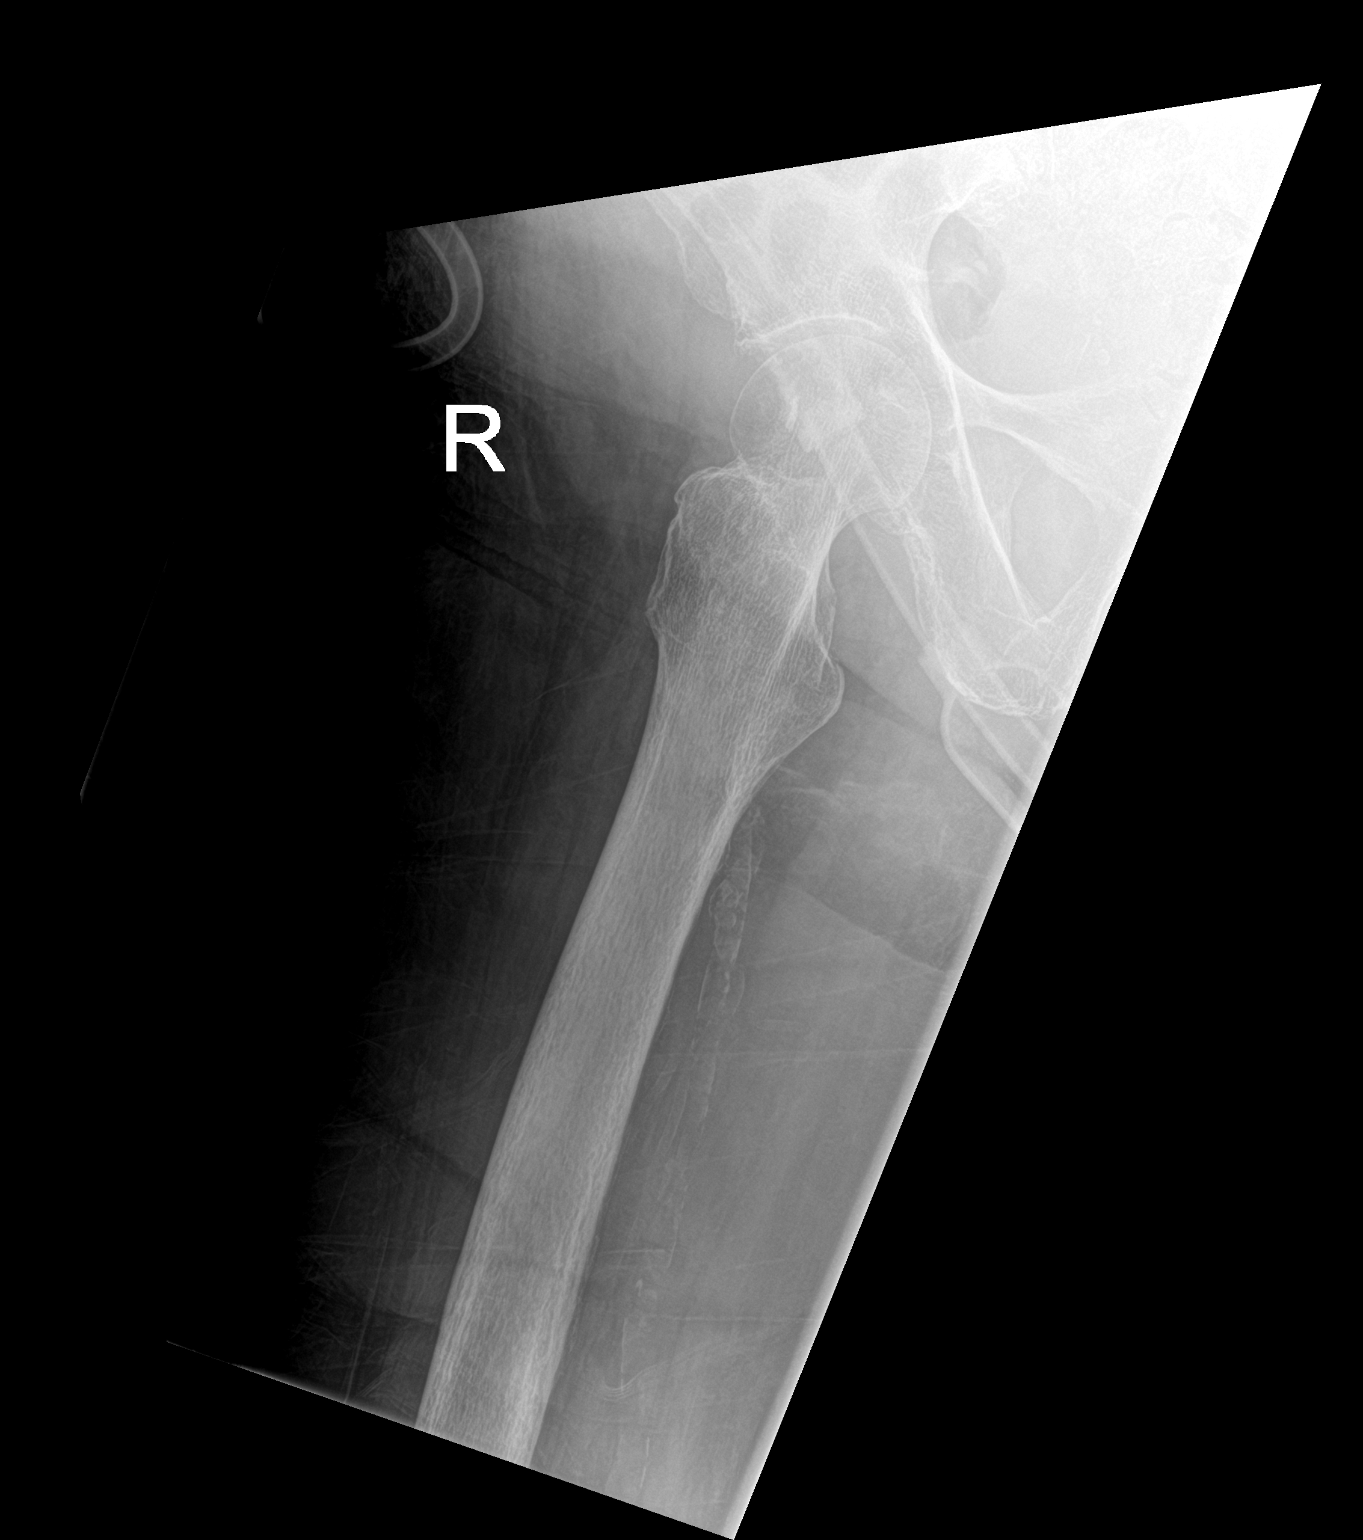

[3 of 3 positions shown; findings below may reference images not displayed]

FINDINGS: Femoral heads are normally located. Bone mineralization is within
normal limits for age. Hip joint spaces appear symmetric and normal
for age. No pelvis fracture identified. Retained stool in the large
bowel in the lower abdomen and pelvis. Vascular calcifications.
Grossly intact proximal left femur. Intact proximal right femur. No
acute osseous abnormality identified.
IMPRESSION: No acute osseous abnormality identified about the right hip or
pelvis.

## 2020-04-04 IMAGING — DX DG KNEE 1-2V*R*
2 series · 2 of 2 positions shown · non-contrast
Comparison: None.

CLINICAL DATA: [AGE] female with knee and hip pain. No known
injury.

EXAM:
RIGHT KNEE - 1-2 VIEW

[knee ap]
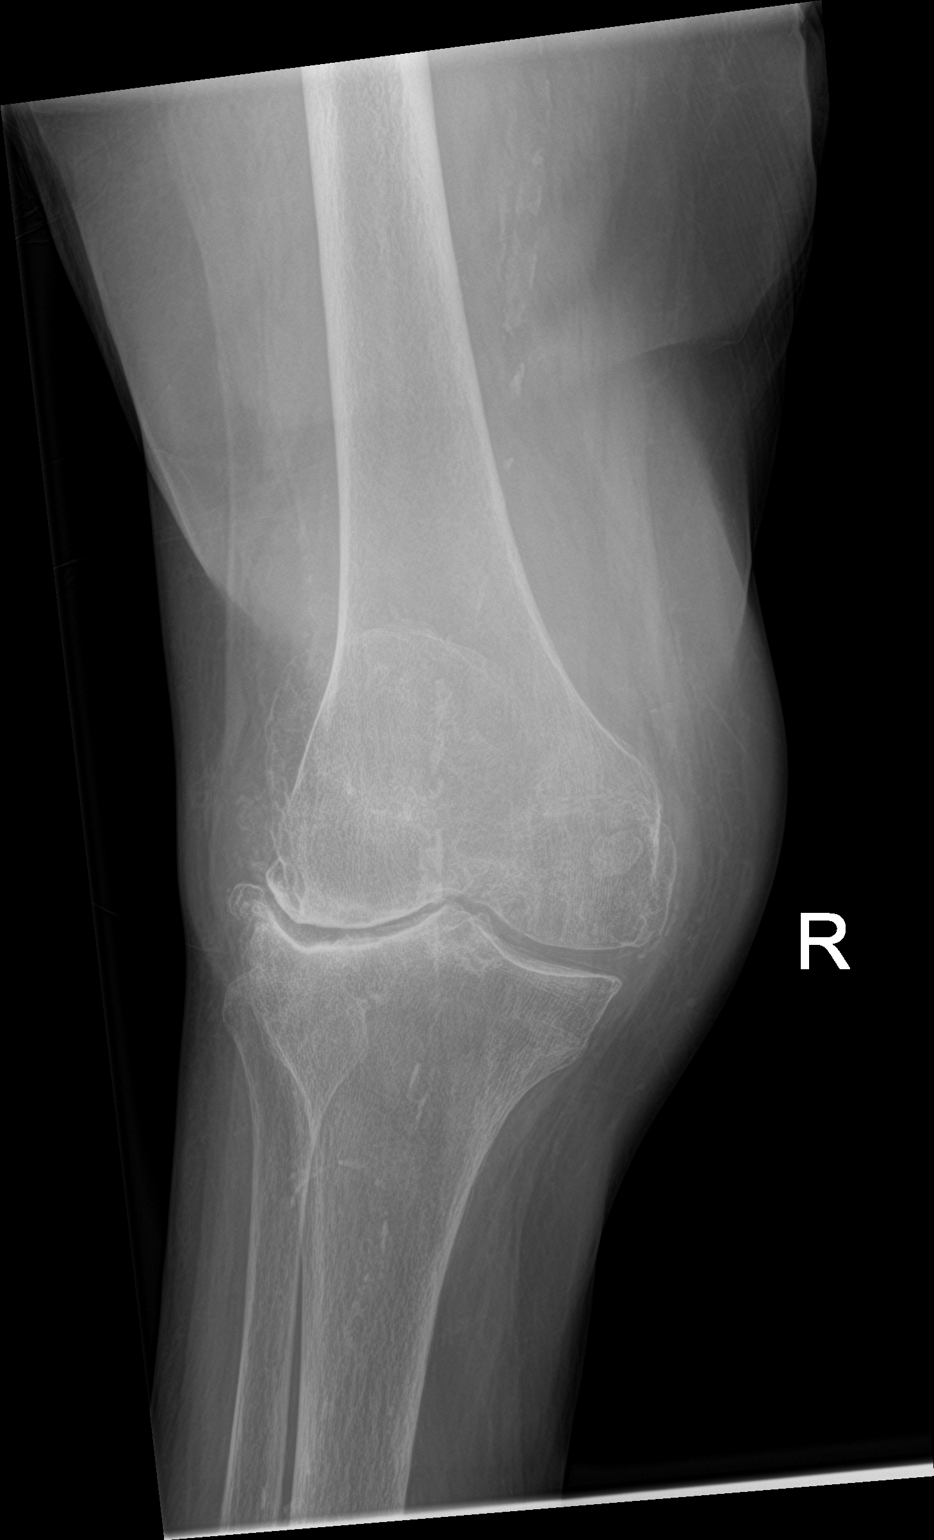

[knee lat]
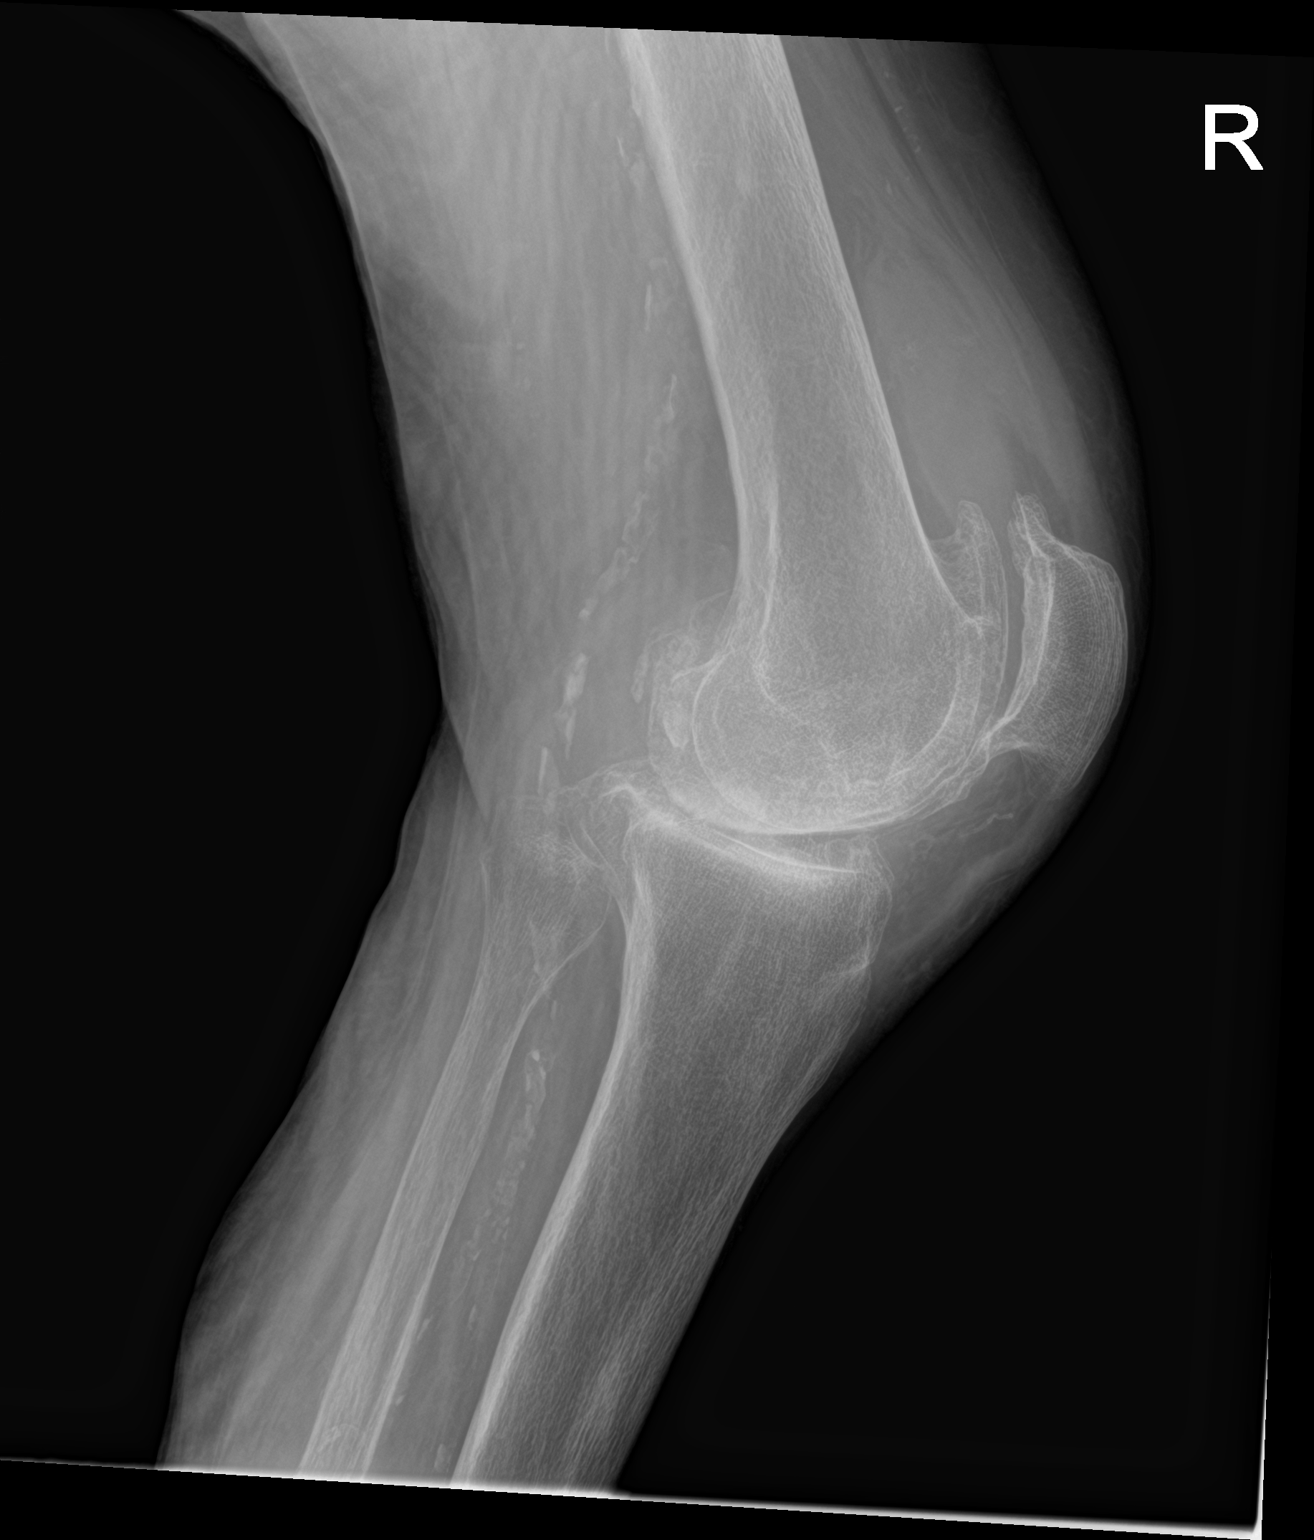

[2 of 2 positions shown; findings below may reference images not displayed]

FINDINGS: Severe tricompartmental degenerative changes, very severe joint
space loss and degenerative spurring at the lateral and
patellofemoral compartments. Small to moderate joint effusion.
Patella appears intact. No acute osseous abnormality identified.
Superimposed calcified peripheral vascular disease.
IMPRESSION: 1. Severe tricompartmental degenerative changes with small to
moderate joint effusion.
2. Calcified peripheral vascular disease.

## 2020-04-04 IMAGING — DX DG CHEST 2V
2 series · 2 of 2 positions shown · non-contrast
Comparison: Radiograph 06/28/2019

CLINICAL DATA: Chest pain and pressure.

EXAM:
CHEST - 2 VIEW

[chest lat]
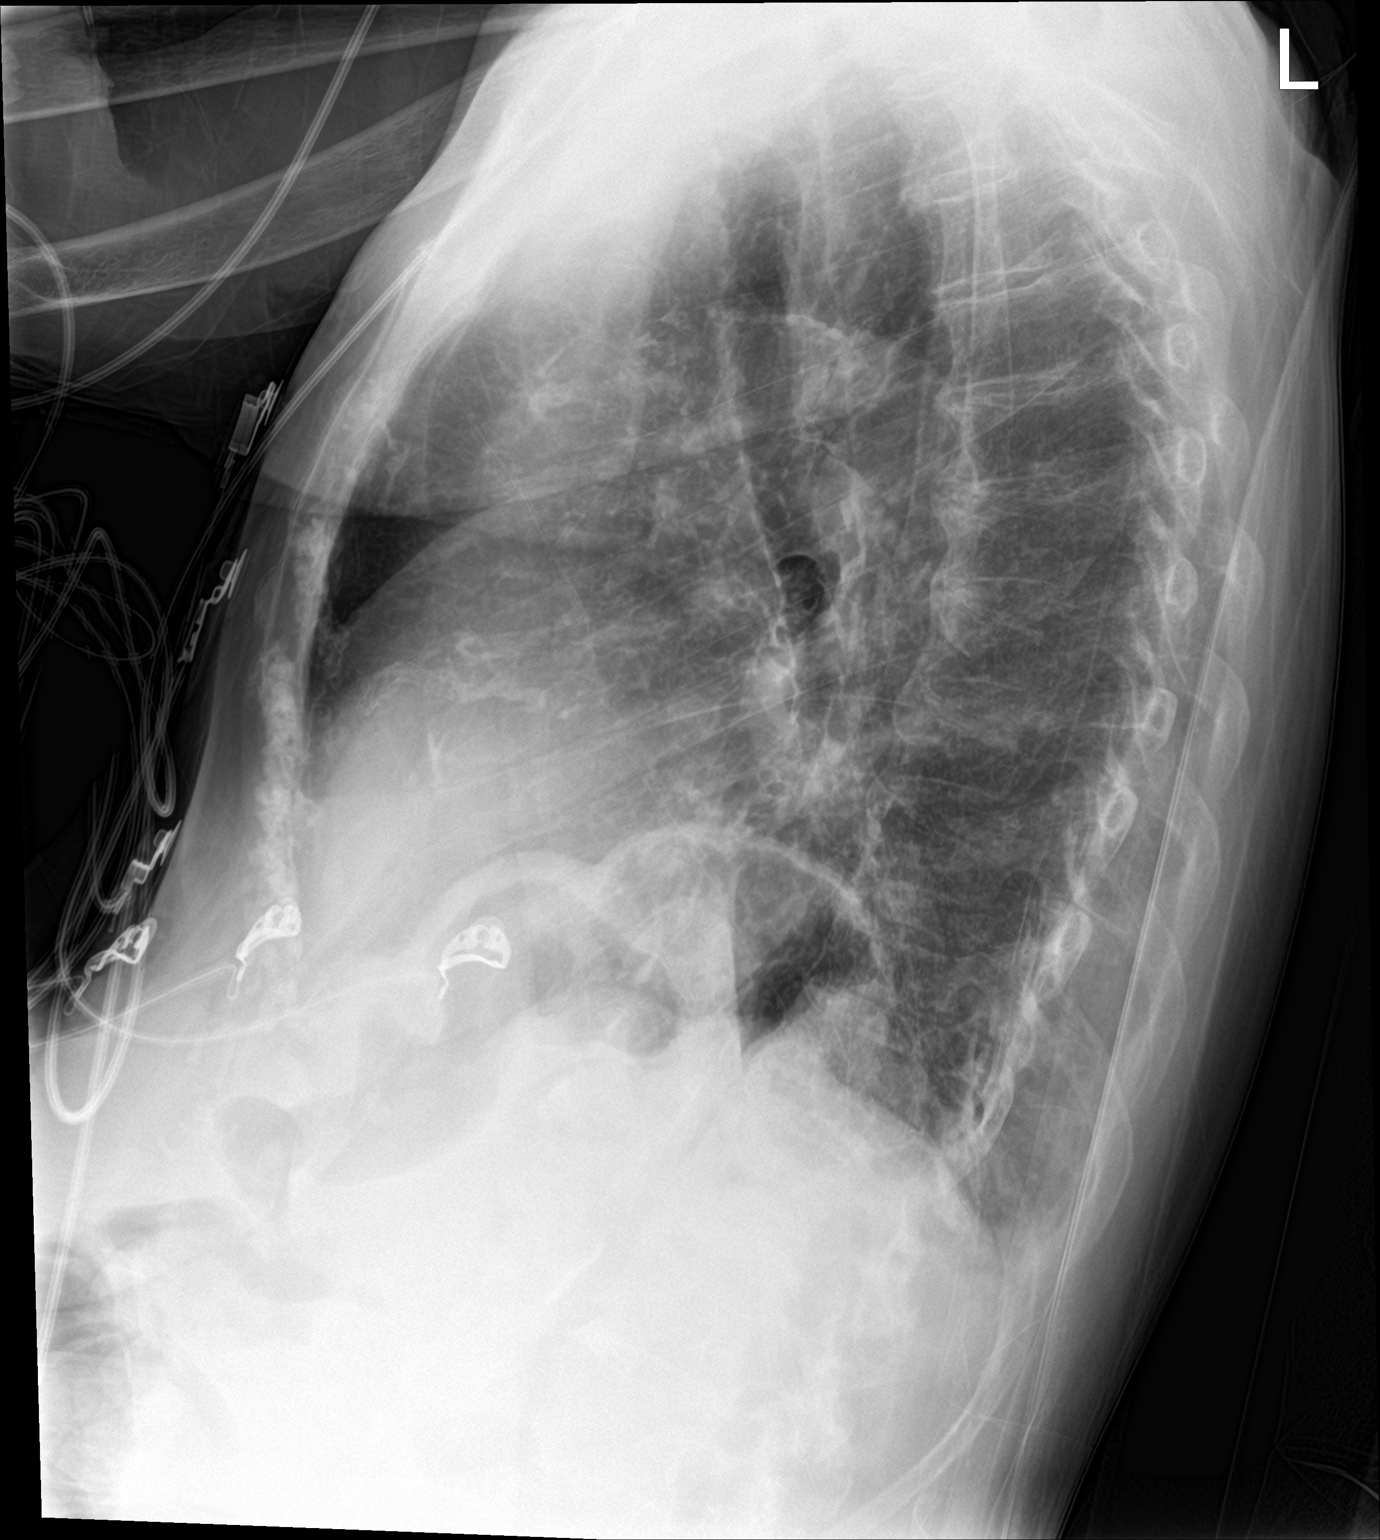

[chest ap]
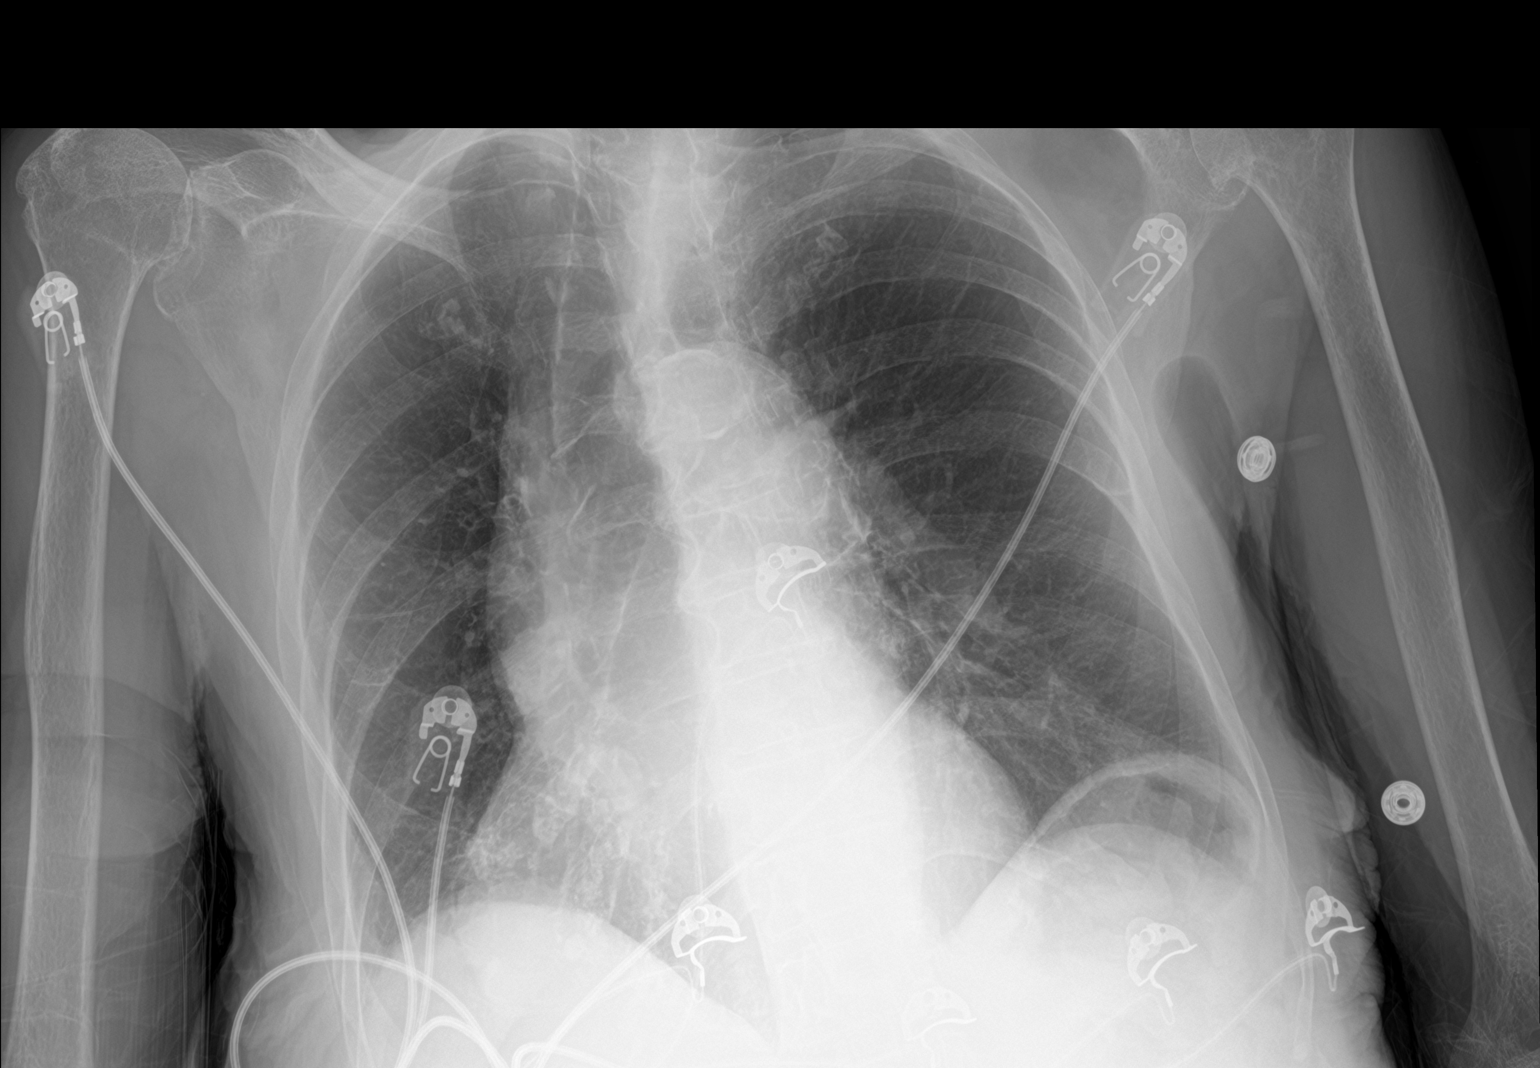

[2 of 2 positions shown; findings below may reference images not displayed]

FINDINGS: Mild cardiomegaly, unchanged. Unchanged mediastinal contours with
aortic atherosclerosis and tortuosity. Chronic rightward tracheal
deviation. Normal pulmonary vasculature. No focal airspace disease,
pulmonary edema or pneumothorax. Chronic change of both shoulders.
IMPRESSION: Stable mild cardiomegaly. No acute abnormality.

Aortic Atherosclerosis ([id number]).

## 2020-04-04 MED ORDER — MORPHINE SULFATE (PF) 2 MG/ML IV SOLN
2.0000 mg | Freq: Once | INTRAVENOUS | Status: AC
  Administered 2020-04-04: 2 mg via INTRAVENOUS
  Filled 2020-04-04: qty 1

## 2020-04-04 NOTE — ED Triage Notes (Signed)
Pt BIB [facility] from home with CP since afternoon. R sided Pressure, non-radiating.  Cardiac Hx: Stent Placement. Hx of HTN  EKG Unremarkable with EMS, NSR with PVC/PAC, HR 90s  Bps: 150-180 Systolic with EMS CBG 204 RR 18 96 O2 RA  Fall on Sunday; Slid off floor, no complaints and no pain since.   324 ASA given by EMS.

## 2020-04-04 NOTE — ED Notes (Signed)
Pt transported to XRAY °

## 2020-04-04 NOTE — ED Provider Notes (Signed)
[facility] EMERGENCY DEPARTMENT Provider Note   CSN: [id number] Arrival date & time: 04/04/20  2110     History Chief Complaint  Patient presents with  . Chest Pain    Bonnie Morton is a 86 y.o. female.  HPI Patient presents with chief complaint of chest pain.  History is obtained both from the patient and by telephone call to her niece who is in frequent contact.  Patient denies she is having any chest pain at this time.  She minimizes this symptom and advises that her doctor is already treated her for that problem and she has the appropriate medications for chest symptoms.  She is denying cough fever or shortness of breath.  Patient is advising she has had longstanding pain and problems with osteoarthritis.  She is under the impression that her doctor wanted her to come to the emergency department for further assessment of severe problems with her osteoarthritis.  Patient has a very swollen and painful right knee.  Patient describes this as having been there for weeks or more.  Clarification however with the patient's niece is that the swelling and limiting pain started after the patient fell about 3 days ago.  Patient did describe falling to me.  She described it as her foot slipping so that her knee strained it but denies that she had any fall to the ground or injury.  Patient's niece however reports that the patient was on the ground and it was about an hour before anybody could get there to help her.  The patient refused to come to the emergency department for evaluation on the day that she fell.  She insists that she would get better.  The patient's niece has been helping her in her home.  She has been taking her food and assisting her to move around.  She reports she has been very limited however mostly staying in bed since the fall.  She reports as of today the patient absolutely could not put any weight on the leg and was unable to walk.  Patient's niece reports the  patient then started complaining of more pain and  Reported chest pain as well. At that point her niece insisted she come to the emergency department for further evaluation.   Patient is living independently at her home with her 2 sisters.  They are elderly as well.  The patient's niece lives right across the yard from them and assists in caregiving as well.    Past Medical History:  Diagnosis Date  . Antral ulcer    chronic atrophic gastritis with intestinal metaplasia and surface erosion  . CAD (coronary artery disease)    Pt. develpoed chest burning w/raiation to her L arm /exertion in 08. She went to ER, found to have NSTEMI, LHC showed long 75-80% proximal to mid LAD stenosis w/99% ostial D2 stenosis and diffuse mild to moderate RCA disease. She had 2 Cypher stents to the proximal to mid LAD. Echo (5/11): EF 60-65%, grade 1 diastolic dysfunction, normal wall motion, no significant valvular abnormalities.   . Glaucoma   . H/O: hysterectomy   . HTN (hypertension)   . Hyperlipidemia   . Iron deficiency anemia    with normal colonoscopy last year per her report.   . Osteoarthritis of hip   . S/P subtotal thyroidectomy     Patient Active Problem List   Diagnosis Date Noted  . Near syncope 06/28/2019  . Dehydration 06/28/2019  . Hypotension 06/28/2019  . CAD (  coronary artery disease)   . Chest pain 12/25/2017  . Non-ST elevation (NSTEMI) myocardial infarction (HCC)   . Edema of lower extremity 08/05/2011  . Ulcer, peptic, acute or chronic 08/03/2011  . Anemia 08/01/2011  . Constipation 08/01/2011  . Hyperlipidemia 03/30/2011  . Essential hypertension 01/10/2010  . CORONARY ATHEROSCLEROSIS NATIVE CORONARY ARTERY 01/10/2010    Past Surgical History:  Procedure Laterality Date  . ABDOMINAL HYSTERECTOMY    . ESOPHAGOGASTRODUODENOSCOPY  08/03/2011   Procedure: ESOPHAGOGASTRODUODENOSCOPY (EGD);  Surgeon: Hart Carwinora M Brodie, MD;  Location: [facility];  Service: Endoscopy;  Laterality:  N/A;  . EYE SURGERY     R eye cataract surgery  . LEFT HEART CATH AND CORONARY ANGIOGRAPHY N/A 12/25/2017   Procedure: LEFT HEART CATH AND CORONARY ANGIOGRAPHY;  Surgeon: Tonny Bollmanooper, Michael, MD;  Location: [facility] CV [facility];  Service: Cardiovascular;  Laterality: N/A;  . THYROIDECTOMY     subtotal     OB History   No obstetric history on file.     Family History  Problem Relation Age of Onset  . Hypertension Mother   . Lung cancer Father     Social History   Tobacco Use  . Smoking status: Never Smoker  . Smokeless tobacco: Never Used  Substance Use Topics  . Alcohol use: No  . Drug use: Not on file    Home Medications Prior to Admission medications   Medication Sig Start Date End Date Taking? Authorizing Provider  atorvastatin (LIPITOR) 10 MG tablet Take 10 mg by mouth daily. 10/26/17  Yes [provider]  clopidogrel (PLAVIX) 75 MG tablet Take 1 tablet (75 mg total) by mouth daily. 12/27/17  Yes Alison Murrayevine, Alma M, MD  Multiple Vitamins-Minerals (CENTRUM SILVER ADULT 50+) TABS Take 1 tablet by mouth daily at 12 noon.   Yes [provider]  acetaminophen (TYLENOL) 500 MG tablet Take 1,000 mg by mouth as needed for mild pain or headache.    [provider]  amLODipine (NORVASC) 5 MG tablet Take 5 mg by mouth daily. 01/22/20   [provider]  metoprolol tartrate (LOPRESSOR) 25 MG tablet Take 0.5 tablets (12.5 mg total) by mouth 2 (two) times daily. Patient taking differently: Take 25 mg by mouth daily at 12 noon.  06/29/19   Joseph ArtVann, Jessica U, DO  valsartan-hydrochlorothiazide (DIOVAN-HCT) 160-25 MG tablet Take 1 tablet by mouth daily. 01/22/20   [provider]    Allergies    Patient has no known allergies.  Review of Systems   Review of Systems 10 systems reviewed and negative except as per HPI Physical Exam Updated Vital Signs BP 127/71   Pulse 64   Temp 98.4 F (36.9 C) (Oral)   Resp 19   Ht 5\' 3"  (1.6 m)   Wt 54 kg   SpO2  98%   BMI 21.09 kg/m   Physical Exam Constitutional:      Comments: Patient is alert and interactive.  Speech is clear.  No respiratory distress.  HENT:     Head: Normocephalic and atraumatic.     Nose: Nose normal.     Mouth/Throat:     Mouth: Mucous membranes are moist.     Pharynx: Oropharynx is clear.  Eyes:     Extraocular Movements: Extraocular movements intact.     Pupils: Pupils are equal, round, and reactive to light.  Cardiovascular:     Comments: Heart is regular with frequent ectopic beats. Pulmonary:     Effort: Pulmonary effort is normal.  Breath sounds: Normal breath sounds.  Chest:     Chest wall: No tenderness.  Abdominal:     General: There is no distension.     Palpations: Abdomen is soft.     Tenderness: There is no abdominal tenderness. There is no guarding.  Musculoskeletal:     Cervical back: Neck supple.     Comments: Patient has moderate swelling of her right knee.  Effusion present.  No warmth or redness.  Lower portion of the leg without edema.  Severe arthritic changes of left knee but no apparent effusion.  No abrasions or wounds to the lower legs.  Skin:    General: Skin is warm and dry.  Neurological:     General: No focal deficit present.     Cranial Nerves: No cranial nerve deficit.     Coordination: Coordination normal.     Comments: Patient is alert.  Speech is clear.  She is oriented.  Historical elements from her niece are generally similar although patient may have some lack of total recall.  She follows commands appropriately.  No focal motor deficits.  Psychiatric:        Mood and Affect: Mood normal.     ED Results / Procedures / Treatments   Labs (all labs ordered are listed, but only abnormal results are displayed) Labs Reviewed  BASIC METABOLIC PANEL - Abnormal; Notable for the following components:      Result Value   Glucose, Bld 159 (*)    GFR calc non Af Amer 59 (*)    All other components within normal limits  CBC  - Abnormal; Notable for the following components:   RBC 3.63 (*)    Hemoglobin 11.3 (*)    HCT 35.4 (*)    All other components within normal limits  HEPATIC FUNCTION PANEL - Abnormal; Notable for the following components:   Total Protein 6.0 (*)    Albumin 2.4 (*)    AST 51 (*)    Total Bilirubin 1.3 (*)    Bilirubin, Direct 0.3 (*)    Indirect Bilirubin 1.0 (*)    All other components within normal limits  TROPONIN I (HIGH SENSITIVITY) - Abnormal; Notable for the following components:   Troponin I (High Sensitivity) 68 (*)    All other components within normal limits  TROPONIN I (HIGH SENSITIVITY) - Abnormal; Notable for the following components:   Troponin I (High Sensitivity) 74 (*)    All other components within normal limits  SARS CORONAVIRUS 2 BY RT PCR (HOSPITAL ORDER, PERFORMED IN [facility])  LIPASE, BLOOD  URIC ACID  PROTIME-INR  URINALYSIS, ROUTINE W REFLEX MICROSCOPIC    EKG EKG Interpretation  Date/Time:  Wednesday April 04 2020 21:21:25 EDT Ventricular Rate:  80 PR Interval:    QRS Duration: 110 QT Interval:  389 QTC Calculation: 449 R Axis:   -65 Text Interpretation: Sinus or ectopic atrial rhythm Atrial premature complexes Incomplete RBBB and LAFB Consider right ventricular hypertrophy Probable left ventricular hypertrophy no change from previous Confirmed by Arby Barrette [phone]) on 04/05/2020 12:33:15 AM   Radiology DG Chest 2 View  Result Date: 04/04/2020 CLINICAL DATA:  Chest pain and pressure. EXAM: CHEST - 2 VIEW COMPARISON:  Radiograph 06/28/2019 FINDINGS: Mild cardiomegaly, unchanged. Unchanged mediastinal contours with aortic atherosclerosis and tortuosity. Chronic rightward tracheal deviation. Normal pulmonary vasculature. No focal airspace disease, pulmonary edema or pneumothorax. Chronic change of both shoulders. IMPRESSION: Stable mild cardiomegaly. No acute abnormality. Aortic Atherosclerosis (ICD10-I70.0). Electronically Signed  By: Narda Rutherford M.D.   On: 04/04/2020 22:04   DG Knee 2 Views Right  Result Date: 04/04/2020 CLINICAL DATA:  86 year old female with knee and hip pain. No known injury. EXAM: RIGHT KNEE - 1-2 VIEW COMPARISON:  None. FINDINGS: Severe tricompartmental degenerative changes, very severe joint space loss and degenerative spurring at the lateral and patellofemoral compartments. Small to moderate joint effusion. Patella appears intact. No acute osseous abnormality identified. Superimposed calcified peripheral vascular disease. IMPRESSION: 1. Severe tricompartmental degenerative changes with small to moderate joint effusion. 2. Calcified peripheral vascular disease. Electronically Signed   By: Odessa Fleming M.D.   On: 04/04/2020 23:03   DG Hip Unilat W or Wo Pelvis 2-3 Views Right  Result Date: 04/04/2020 CLINICAL DATA:  86 year old female with knee and hip pain. No known injury. EXAM: DG HIP (WITH OR WITHOUT PELVIS) 2-3V RIGHT COMPARISON:  Right hip series 11/29/2006. FINDINGS: Femoral heads are normally located. Bone mineralization is within normal limits for age. Hip joint spaces appear symmetric and normal for age. No pelvis fracture identified. Retained stool in the large bowel in the lower abdomen and pelvis. Vascular calcifications. Grossly intact proximal left femur. Intact proximal right femur. No acute osseous abnormality identified. IMPRESSION: No acute osseous abnormality identified about the right hip or pelvis. Electronically Signed   By: Odessa Fleming M.D.   On: 04/04/2020 23:04    Procedures Procedures (including critical care time)  Medications Ordered in ED Medications  oxyCODONE (Oxy IR/ROXICODONE) immediate release tablet 5 mg (has no administration in time range)  morphine 2 MG/ML injection 2 mg (2 mg Intravenous Given 04/04/20 2223)    ED Course  I have reviewed the triage vital signs and the nursing notes.  Pertinent labs & imaging results that were available during my care of the  patient were reviewed by me and considered in my medical decision making (see chart for details).    MDM Rules/Calculators/A&P                          Consult: Dr. Toniann Fail for admission  Patient presents as outlined above.  She is alert and interactive.  No confusion.  Patient has mild troponin elevation.  She is denying chest pain at this time.  EKG does not show acute changes.  Will plan for admission for continued rule out for MI.  Other issue is a fall about 3 days ago that resulted in knee injury.  No fracture present on x-rays of the right knee.  Patient has severe degenerative changes and a joint effusion.  She has not been able been weightbearing since that time.  Patient will need further assessment to determine if she will require placement for rehab for knee pain and ambulatory dysfunction as well. Final Clinical Impression(s) / ED Diagnoses Final diagnoses:  Nonspecific chest pain  Effusion of right knee  Ambulatory dysfunction    Rx / DC Orders ED Discharge Orders    None       Arby Barrette, MD 04/05/20 [phone]

## 2020-04-05 ENCOUNTER — Encounter (HOSPITAL_COMMUNITY): Payer: Self-pay | Admitting: Internal Medicine

## 2020-04-05 DIAGNOSIS — M25461 Effusion, right knee: Secondary | ICD-10-CM | POA: Diagnosis present

## 2020-04-05 DIAGNOSIS — I10 Essential (primary) hypertension: Secondary | ICD-10-CM

## 2020-04-05 DIAGNOSIS — E785 Hyperlipidemia, unspecified: Secondary | ICD-10-CM | POA: Diagnosis not present

## 2020-04-05 DIAGNOSIS — R079 Chest pain, unspecified: Secondary | ICD-10-CM | POA: Diagnosis not present

## 2020-04-05 DIAGNOSIS — I25119 Atherosclerotic heart disease of native coronary artery with unspecified angina pectoris: Secondary | ICD-10-CM

## 2020-04-05 DIAGNOSIS — Z66 Do not resuscitate: Secondary | ICD-10-CM

## 2020-04-05 LAB — MRSA PCR SCREENING: MRSA by PCR: NEGATIVE

## 2020-04-05 LAB — SYNOVIAL CELL COUNT + DIFF, W/ CRYSTALS
Eosinophils-Synovial: 0 % (ref 0–1)
Lymphocytes-Synovial Fld: 2 % (ref 0–20)
Monocyte-Macrophage-Synovial Fluid: 2 % — ABNORMAL LOW (ref 50–90)
Neutrophil, Synovial: 96 % — ABNORMAL HIGH (ref 0–25)
WBC, Synovial: 10750 /mm3 — ABNORMAL HIGH (ref 0–200)

## 2020-04-05 LAB — CBC
HCT: 34.4 % — ABNORMAL LOW (ref 36.0–46.0)
Hemoglobin: 10.8 g/dL — ABNORMAL LOW (ref 12.0–15.0)
MCH: 30.9 pg (ref 26.0–34.0)
MCHC: 31.4 g/dL (ref 30.0–36.0)
MCV: 98.6 fL (ref 80.0–100.0)
Platelets: 209 10*3/uL (ref 150–400)
RBC: 3.49 MIL/uL — ABNORMAL LOW (ref 3.87–5.11)
RDW: 13 % (ref 11.5–15.5)
WBC: 9 10*3/uL (ref 4.0–10.5)
nRBC: 0 % (ref 0.0–0.2)

## 2020-04-05 LAB — TROPONIN I (HIGH SENSITIVITY)
Troponin I (High Sensitivity): 67 ng/L — ABNORMAL HIGH (ref <18)
Troponin I (High Sensitivity): 71 ng/L — ABNORMAL HIGH (ref <18)
Troponin I (High Sensitivity): 74 ng/L — ABNORMAL HIGH (ref <18)

## 2020-04-05 LAB — CREATININE, SERUM
Creatinine, Ser: 0.85 mg/dL (ref 0.44–1.00)
GFR calc Af Amer: >60 mL/min (ref >60)
GFR calc non Af Amer: >60 mL/min (ref >60)

## 2020-04-05 LAB — PROTIME-INR
INR: 1.1 (ref 0.8–1.2)
Prothrombin Time: 13.5 seconds (ref 11.4–15.2)

## 2020-04-05 LAB — SARS CORONAVIRUS 2 BY RT PCR (HOSPITAL ORDER, PERFORMED IN ~~LOC~~ HOSPITAL LAB): SARS Coronavirus 2: NEGATIVE

## 2020-04-05 MED ORDER — AMLODIPINE BESYLATE 5 MG PO TABS
5.0000 mg | ORAL_TABLET | Freq: Every day | ORAL | Status: DC
  Administered 2020-04-05 – 2020-04-07 (×3): 5 mg via ORAL
  Filled 2020-04-05 (×3): qty 1

## 2020-04-05 MED ORDER — METHYLPREDNISOLONE ACETATE 80 MG/ML IJ SUSP
80.0000 mg | Freq: Once | INTRAMUSCULAR | Status: DC
  Filled 2020-04-05: qty 1

## 2020-04-05 MED ORDER — HYDROCHLOROTHIAZIDE 25 MG PO TABS: 25.0000 mg | ORAL_TABLET | Freq: Every day | ORAL | Status: DC

## 2020-04-05 MED ORDER — ACETAMINOPHEN 500 MG PO TABS
1000.0000 mg | ORAL_TABLET | Freq: Three times a day (TID) | ORAL | Status: DC
  Administered 2020-04-05 – 2020-04-07 (×7): 1000 mg via ORAL
  Filled 2020-04-05 (×7): qty 2

## 2020-04-05 MED ORDER — ATORVASTATIN CALCIUM 10 MG PO TABS
10.0000 mg | ORAL_TABLET | Freq: Every day | ORAL | Status: DC
  Administered 2020-04-05 – 2020-04-07 (×3): 10 mg via ORAL
  Filled 2020-04-05 (×3): qty 1

## 2020-04-05 MED ORDER — OXYCODONE HCL 5 MG PO TABS
5.0000 mg | ORAL_TABLET | Freq: Once | ORAL | Status: AC
  Administered 2020-04-05: 5 mg via ORAL
  Filled 2020-04-05: qty 1

## 2020-04-05 MED ORDER — BUPIVACAINE HCL (PF) 0.5 % IJ SOLN
10.0000 mL | Freq: Once | INTRAMUSCULAR | Status: DC
  Filled 2020-04-05: qty 10

## 2020-04-05 MED ORDER — VALSARTAN-HYDROCHLOROTHIAZIDE 160-25 MG PO TABS: 1.0000 | ORAL_TABLET | Freq: Every day | ORAL | Status: DC

## 2020-04-05 MED ORDER — DICLOFENAC SODIUM 1 % EX GEL
2.0000 g | Freq: Four times a day (QID) | CUTANEOUS | Status: DC
  Administered 2020-04-05 – 2020-04-07 (×8): 2 g via TOPICAL
  Filled 2020-04-05: qty 100

## 2020-04-05 MED ORDER — ACETAMINOPHEN 325 MG PO TABS: 650.0000 mg | ORAL_TABLET | ORAL | Status: DC | PRN

## 2020-04-05 MED ORDER — HEPARIN SODIUM (PORCINE) 5000 UNIT/ML IJ SOLN
5000.0000 [IU] | Freq: Three times a day (TID) | INTRAMUSCULAR | Status: DC
  Administered 2020-04-05 – 2020-04-07 (×6): 5000 [IU] via SUBCUTANEOUS
  Filled 2020-04-05 (×6): qty 1

## 2020-04-05 MED ORDER — ONDANSETRON HCL 4 MG/2ML IJ SOLN: 4.0000 mg | Freq: Four times a day (QID) | INTRAMUSCULAR | Status: DC | PRN

## 2020-04-05 MED ORDER — SENNA 8.6 MG PO TABS
1.0000 | ORAL_TABLET | Freq: Every day | ORAL | Status: DC
  Administered 2020-04-05 – 2020-04-07 (×3): 8.6 mg via ORAL
  Filled 2020-04-05 (×3): qty 1

## 2020-04-05 MED ORDER — CLOPIDOGREL BISULFATE 75 MG PO TABS
75.0000 mg | ORAL_TABLET | Freq: Every day | ORAL | Status: DC
  Administered 2020-04-05 – 2020-04-07 (×3): 75 mg via ORAL
  Filled 2020-04-05 (×3): qty 1

## 2020-04-05 MED ORDER — METOPROLOL TARTRATE 25 MG PO TABS
25.0000 mg | ORAL_TABLET | Freq: Every day | ORAL | Status: DC
  Administered 2020-04-05: 25 mg via ORAL
  Filled 2020-04-05: qty 1

## 2020-04-05 MED ORDER — IRBESARTAN 150 MG PO TABS
150.0000 mg | ORAL_TABLET | Freq: Every day | ORAL | Status: DC
  Administered 2020-04-05 – 2020-04-07 (×3): 150 mg via ORAL
  Filled 2020-04-05 (×4): qty 1

## 2020-04-05 MED ORDER — OXYCODONE HCL 5 MG PO TABS: 5.0000 mg | ORAL_TABLET | Freq: Four times a day (QID) | ORAL | Status: DC | PRN

## 2020-04-05 MED ORDER — BISACODYL 10 MG RE SUPP
10.0000 mg | Freq: Every day | RECTAL | Status: DC | PRN
  Administered 2020-04-06: 10 mg via RECTAL
  Filled 2020-04-05: qty 1

## 2020-04-05 NOTE — Consult Note (Signed)
Cardiology Consultation:   Patient ID: Bonnie Morton MRN: [id number]; DOB: 02-02-1928  Admit date: 04/04/2020 Date of Consult: 04/05/2020  Primary Care Provider: Andi Devon, MD [facility] Cardiologist: Tobias Alexander, MD  [facility] Electrophysiologist:  None    Patient Profile:   Bonnie Morton is a 86 y.o. female with a hx of CAD s/p PCI, HTN, HLD, subtotal thyroidectomy, and iron deficiency anemia who is being seen today for the evaluation of chest pain at the request of Dr. Benjamine Mola.  History of Present Illness:   Bonnie Morton has a history of CAD s/p NSTEMI in 2008 with 2 cypher stents placed to proximal-mid LAD. Anginal symptoms included chest burning with left arm radiation while walking. She previously followed with Dr. Shirlee Latch (2012) and was last seen 12/2017 while hospitalized for chest pain. She underwent left heart cath at that time which revealed patent midLAD stent, moderate diffuse RCA stenosis,, widely paent left main and left Cx, but total occlusion of the apical LAD. There was some suspicion for an embolic event and heart monitor was planned as an outpatient.  It does not appear this was completed. She canceled her cardiology follow up appt and has not been seen since. She was seen in the ER 06/28/19 after an episode of dizziness and hypotension. She received IVF, suspected hypotension was due to dehydration. Her anti-hypertensives were held, including amlodipine and valsartan-HCTZ. She was continued on ASA, plavix, 10 mg lipitor, and 12.5 mg lopressor BID.  She presented to [facility] 04/04/20 with chest pain. Per Epic, she reports problems with worsening right knee effusion and increasing pain in her right knee. She reported a fall but did not hit her knee. She started having chest pain yesterday afternoon 04/04/20. PCP advised ED evaluation, EMS was dispatched and administered nitro SL which resolved her chest pain.   HS troponin 68 --> 74 --> 71 --> 67 EKG with conduction  disease, but no signs of new ischemia Hb 10.8 (stable, baseline)  Of note CE mildly elevated during last hospitalization for dizziness and hypotension.   On my interview, she seems to be a poor historian. She states her chest pain started in the ambulance and not before. She states her PCP "sensed" she needed to go to the ER.  She reports chest discomfort located in her lower substernal area without radiation and without associated symptoms. She has not had a recurrence of chest pain since arriving at [facility]. She denies chest pain since 2019. She states she has been compliant on her home anti-hypertensives, but also states she sometimes has problems following directions. Per patient, PCP has been pleased with her BP control recently.  She had not taken SL nitro recently. No recent illness other than her right knee.    Past Medical History:  Diagnosis Date  . Antral ulcer    chronic atrophic gastritis with intestinal metaplasia and surface erosion  . CAD (coronary artery disease)    Pt. develpoed chest burning w/raiation to her L arm /exertion in 08. She went to ER, found to have NSTEMI, LHC showed long 75-80% proximal to mid LAD stenosis w/99% ostial D2 stenosis and diffuse mild to moderate RCA disease. She had 2 Cypher stents to the proximal to mid LAD. Echo (5/11): EF 60-65%, grade 1 diastolic dysfunction, normal wall motion, no significant valvular abnormalities.   . Glaucoma   . H/O: hysterectomy   . HTN (hypertension)   . Hyperlipidemia   . Iron deficiency anemia    with normal colonoscopy last  year per her report.   . Osteoarthritis of hip   . S/P subtotal thyroidectomy     Past Surgical History:  Procedure Laterality Date  . ABDOMINAL HYSTERECTOMY    . ESOPHAGOGASTRODUODENOSCOPY  08/03/2011   Procedure: ESOPHAGOGASTRODUODENOSCOPY (EGD);  Surgeon: Hart Carwin, MD;  Location: [facility];  Service: Endoscopy;  Laterality: N/A;  . EYE SURGERY     R eye cataract surgery  . LEFT HEART  CATH AND CORONARY ANGIOGRAPHY N/A 12/25/2017   Procedure: LEFT HEART CATH AND CORONARY ANGIOGRAPHY;  Surgeon: Tonny Bollman, MD;  Location: [facility] INVASIVE CV LAB;  Service: Cardiovascular;  Laterality: N/A;  . THYROIDECTOMY     subtotal     Home Medications:  Prior to Admission medications   Medication Sig Start Date End Date Taking? Authorizing Provider  acetaminophen (TYLENOL) 500 MG tablet Take 1,000 mg by mouth as needed for mild pain or headache.   Yes [provider]  amLODipine (NORVASC) 5 MG tablet Take 5 mg by mouth daily. 01/22/20  Yes [provider]  atorvastatin (LIPITOR) 10 MG tablet Take 10 mg by mouth daily. 10/26/17  Yes [provider]  clopidogrel (PLAVIX) 75 MG tablet Take 1 tablet (75 mg total) by mouth daily. 12/27/17  Yes Alison Murray, MD  metoprolol tartrate (LOPRESSOR) 25 MG tablet Take 0.5 tablets (12.5 mg total) by mouth 2 (two) times daily. Patient taking differently: Take 25 mg by mouth daily at 12 noon.  06/29/19  Yes Joseph Art, DO  Multiple Vitamins-Minerals (CENTRUM SILVER ADULT 50+) TABS Take 1 tablet by mouth daily at 12 noon.   Yes [provider]  psyllium (METAMUCIL) 58.6 % packet Take 1 packet by mouth daily.   Yes [provider]  valsartan-hydrochlorothiazide (DIOVAN-HCT) 160-25 MG tablet Take 1 tablet by mouth daily. 01/22/20  Yes [provider]    Inpatient Medications: Scheduled Meds: . acetaminophen  1,000 mg Oral TID  . amLODipine  5 mg Oral Daily  . atorvastatin  10 mg Oral Daily  . bupivacaine  10 mL Infiltration Once  . clopidogrel  75 mg Oral Daily  . diclofenac Sodium  2 g Topical QID  . heparin  5,000 Units Subcutaneous Q8H  . irbesartan  150 mg Oral Daily  . methylPREDNISolone acetate  80 mg Intra-articular Once  . metoprolol tartrate  25 mg Oral Q1200  . senna  1 tablet Oral Daily   Continuous Infusions:  PRN Meds: bisacodyl, ondansetron (ZOFRAN) IV,  oxyCODONE  Allergies:   No Known Allergies  Social History:   Social History   Socioeconomic History  . Marital status: Divorced    Spouse name: Not on file  . Number of children: Not on file  . Years of education: Not on file  . Highest education level: Not on file  Occupational History  . Not on file  Tobacco Use  . Smoking status: Never Smoker  . Smokeless tobacco: Never Used  Substance and Sexual Activity  . Alcohol use: No  . Drug use: Not on file  . Sexual activity: Not Currently  Other Topics Concern  . 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 Moved from [place] to [place] in late 1990's- father was from [place] area)   Social Determinants of Health   Financial Resource Strain:   . Difficulty of Paying Living Expenses:   Food Insecurity:   . Worried About Programme researcher, broadcasting/film/video in the Last Year:   .  Ran Out of Food in the Last Year:   Transportation Needs:   . Freight forwarderLack of Transportation (Medical):   Marland Kitchen. Lack of Transportation (Non-Medical):   Physical Activity:   . Days of Exercise per Week:   . Minutes of Exercise per Session:   Stress:   . Feeling of Stress :   Social Connections:   . Frequency of Communication with Friends and Family:   . Frequency of Social Gatherings with Friends and Family:   . Attends Religious Services:   . Active Member of Clubs or Organizations:   . Attends BankerClub or Organization Meetings:   Marland Kitchen. Marital Status:   Intimate Partner Violence:   . Fear of Current or Ex-Partner:   . Emotionally Abused:   Marland Kitchen. Physically Abused:   . Sexually Abused:     Family History:    Family History  Problem Relation Age of Onset  . Hypertension Mother   . Lung cancer Father      ROS:  Please see the history of present illness.   All other ROS reviewed and negative.     Physical Exam/Data:   Vitals:   04/05/20 0528 04/05/20 0714 04/05/20 1107 04/05/20 1556  BP: (!) 155/62 (!) 137/62 (!) 186/73 (!) 162/73  Pulse: 57 64  72   Resp: 17 14 23 13   Temp: 97.9 F (36.6 C) 97.6 F (36.4 C) 97.8 F (36.6 C) 97.8 F (36.6 C)  TempSrc: Oral Oral Oral Oral  SpO2: 100% 99% 100% 99%  Weight: 57.2 kg     Height: 5\' 3"  (1.6 m)      No intake or output data in the 24 hours ending 04/05/20 1603 Last 3 Weights 04/05/2020 04/04/2020 06/29/2019  Weight (lbs) 126 lb 1.7 oz 119 lb 0.8 oz 118 lb 13.3 oz  Weight (kg) 57.2 kg 54 kg 53.9 kg     Body mass index is 22.34 kg/m.  General:  Thin, elderly female, no acute distress HEENT: normal Neck: no JVD Vascular: No carotid bruits Cardiac:  Irregular rhythm, regular rhythm Lungs:  clear to auscultation bilaterally, no wheezing, rhonchi or rales  Abd: soft, nontender, no hepatomegaly  Ext: no edema Musculoskeletal:  No deformities, BUE and BLE strength normal and equal Skin: warm and dry  Neuro:  CNs 2-12 intact, no focal abnormalities noted Psych:  Normal affect   EKG:  The EKG was personally reviewed and demonstrates:  Sinus rhythm with PACs or ectopic atrial rhythm, HR 80, iRBBB, LAFB Telemetry:  Telemetry was personally reviewed and demonstrates:  Sinus rhythm   Relevant CV Studies:  Cardiac catheterization 12/25/2017:  Dist LAD lesion is 100% stenosed.  Prox LAD to Mid LAD lesion is 25% stenosed.  Prox LAD lesion is 30% stenosed.  Prox RCA to Mid RCA lesion is 60% stenosed.  The left ventricular ejection fraction is greater than 65% by visual estimate.  LV end diastolic pressure is normal.  1. Continued patency of the stented segment in the mid LAD 2. Moderate diffuse RCA stenosis involving a relatively small caliber vessel with stable appearance compared to the 2008 catheterization study 3. Widely patent left main and left circumflex 4. Total occlusion of the apical LAD, consider embolic event  Recommendations: Continue medical therapy with aspirin and clopidogrel, secondary risk reduction measures. Monitor on telemetry and consider an outpatient  event monitor to exclude atrial fibrillation/coronary embolic event.  Laboratory Data:  High Sensitivity Troponin:   Recent Labs  Lab 04/04/20 2224 04/04/20 2355 04/05/20 0344 04/05/20 [id number]  TROPONINIHS 68* 74* 71* 67*     Chemistry Recent Labs  Lab 04/04/20 2224 04/05/20 0344  NA 139  --   K 3.7  --   CL 105  --   CO2 25  --   GLUCOSE 159*  --   BUN 20  --   CREATININE 0.87 0.85  CALCIUM 9.0  --   GFRNONAA 59* >60  GFRAA >60 >60  ANIONGAP 9  --     Recent Labs  Lab 04/04/20 2224  PROT 6.0*  ALBUMIN 2.4*  AST 51*  ALT 24  ALKPHOS 66  BILITOT 1.3*   Hematology Recent Labs  Lab 04/04/20 2224 04/05/20 0344  WBC 9.3 9.0  RBC 3.63* 3.49*  HGB 11.3* 10.8*  HCT 35.4* 34.4*  MCV 97.5 98.6  MCH 31.1 30.9  MCHC 31.9 31.4  RDW 12.9 13.0  PLT 214 209   BNPNo results for input(s): BNP, PROBNP in the last 168 hours.  DDimer No results for input(s): DDIMER in the last 168 hours.   Radiology/Studies:  DG Chest 2 View  Result Date: 04/04/2020 CLINICAL DATA:  Chest pain and pressure. EXAM: CHEST - 2 VIEW COMPARISON:  Radiograph 06/28/2019 FINDINGS: Mild cardiomegaly, unchanged. Unchanged mediastinal contours with aortic atherosclerosis and tortuosity. Chronic rightward tracheal deviation. Normal pulmonary vasculature. No focal airspace disease, pulmonary edema or pneumothorax. Chronic change of both shoulders. IMPRESSION: Stable mild cardiomegaly. No acute abnormality. Aortic Atherosclerosis (ICD10-I70.0). Electronically Signed   By: Narda Rutherford M.D.   On: 04/04/2020 22:04   DG Knee 2 Views Right  Result Date: 04/04/2020 CLINICAL DATA:  86 year old female with knee and hip pain. No known injury. EXAM: RIGHT KNEE - 1-2 VIEW COMPARISON:  None. FINDINGS: Severe tricompartmental degenerative changes, very severe joint space loss and degenerative spurring at the lateral and patellofemoral compartments. Small to moderate joint effusion. Patella appears intact. No acute  osseous abnormality identified. Superimposed calcified peripheral vascular disease. IMPRESSION: 1. Severe tricompartmental degenerative changes with small to moderate joint effusion. 2. Calcified peripheral vascular disease. Electronically Signed   By: Odessa Fleming M.D.   On: 04/04/2020 23:03   DG Hip Unilat W or Wo Pelvis 2-3 Views Right  Result Date: 04/04/2020 CLINICAL DATA:  86 year old female with knee and hip pain. No known injury. EXAM: DG HIP (WITH OR WITHOUT PELVIS) 2-3V RIGHT COMPARISON:  Right hip series 11/29/2006. FINDINGS: Femoral heads are normally located. Bone mineralization is within normal limits for age. Hip joint spaces appear symmetric and normal for age. No pelvis fracture identified. Retained stool in the large bowel in the lower abdomen and pelvis. Vascular calcifications. Grossly intact proximal left femur. Intact proximal right femur. No acute osseous abnormality identified. IMPRESSION: No acute osseous abnormality identified about the right hip or pelvis. Electronically Signed   By: Odessa Fleming M.D.   On: 04/04/2020 23:04       HEAR Score (for undifferentiated chest pain):  HEAR Score: 5      Assessment and Plan:   Chest pain CAD s/p PCI in 2008, occlusion of distal LAD in 2019 - pt presents with somewhat atypical symptoms  - hs troponin 68 --> 74 --> 71 --> 67 - EKG appears nonischemic, evidence of conduction disease that was seen on prior tracings - pt seems to be a difficult historian, chronicity and description are unclear - will begin with conservative management with BP control - will obtain an echo prior to discharge   Hypertension - home regimen includes amlodipine and valsartan-HCTZ -  pressure has been elevated - will continue to monitor on current regimen, question if elevated pressures due to significant knee pain   Hyperlipidemia - continue statin - followed by PCP   Iron deficiency anemia - appears to be at baseline   For questions or updates,  please contact [facility] Please consult [email or web address] for contact info under    Signed, Marcelino Duster, [place]  04/05/2020 4:03 PM

## 2020-04-05 NOTE — Consult Note (Signed)
Reason for Consult:Right knee pain/effusion Referring Physician: [name] is an 86 y.o. female.  HPI: Bonnie Morton was admitted yesterday with CP. Incidentally she noted that she had been having increased knee pain and swelling on the right that was making ambulation difficult. She has known arthritis in that knee and it has done this before but never quite this bad. She denies fevers, chills, sweats, N/V, or hx/o gout.  Past Medical History:  Diagnosis Date  . Antral ulcer    chronic atrophic gastritis with intestinal metaplasia and surface erosion  . CAD (coronary artery disease)    Pt. develpoed chest burning w/raiation to her L arm /exertion in 08. She went to ER, found to have NSTEMI, LHC showed long 75-80% proximal to mid LAD stenosis w/99% ostial D2 stenosis and diffuse mild to moderate RCA disease. She had 2 Cypher stents to the proximal to mid LAD. Echo (5/11): EF 60-65%, grade 1 diastolic dysfunction, normal wall motion, no significant valvular abnormalities.   . Glaucoma   . H/O: hysterectomy   . HTN (hypertension)   . Hyperlipidemia   . Iron deficiency anemia    with normal colonoscopy last year per her report.   . Osteoarthritis of hip   . S/P subtotal thyroidectomy     Past Surgical History:  Procedure Laterality Date  . ABDOMINAL HYSTERECTOMY    . ESOPHAGOGASTRODUODENOSCOPY  08/03/2011   Procedure: ESOPHAGOGASTRODUODENOSCOPY (EGD);  Surgeon: Hart Carwin, MD;  Location: [facility];  Service: Endoscopy;  Laterality: N/A;  . EYE SURGERY     R eye cataract surgery  . LEFT HEART CATH AND CORONARY ANGIOGRAPHY N/A 12/25/2017   Procedure: LEFT HEART CATH AND CORONARY ANGIOGRAPHY;  Surgeon: Tonny Bollman, MD;  Location: [facility] CV LAB;  Service: Cardiovascular;  Laterality: N/A;  . THYROIDECTOMY     subtotal    Family History  Problem Relation Age of Onset  . Hypertension Mother   . Lung cancer Father     Social History:  reports that she has never  smoked. She has never used smokeless tobacco. She reports that she does not drink alcohol. No history on file for drug use.  Allergies: No Known Allergies  Medications: I have reviewed the patient's current medications.  Results for orders placed or performed during the hospital encounter of 04/04/20 (from the past 48 hour(s))  Basic metabolic panel     Status: Abnormal   Collection Time: 04/04/20 10:24 PM  Result Value Ref Range   Sodium 139 135 - 145 mmol/L   Potassium 3.7 3.5 - 5.1 mmol/L   Chloride 105 98 - 111 mmol/L   CO2 25 22 - 32 mmol/L   Glucose, Bld 159 (H) 70 - 99 mg/dL    Comment: Glucose reference range applies only to samples taken after fasting for at least 8 hours.   BUN 20 8 - 23 mg/dL   Creatinine, Ser 4.62 0.44 - 1.00 mg/dL   Calcium 9.0 8.9 - 70.3 mg/dL   GFR calc non Af Amer 59 (L) >60 mL/min   GFR calc Af Amer >60 >60 mL/min   Anion gap 9 5 - 15    Comment: Performed at [facility], [address], [place], [place] [address]  CBC     Status: Abnormal   Collection Time: 04/04/20 10:24 PM  Result Value Ref Range   WBC 9.3 4.0 - 10.5 K/uL   RBC 3.63 (L) 3.87 - 5.11 MIL/uL   Hemoglobin 11.3 (L)  12.0 - 15.0 g/dL   HCT 23.7 (L) 36 - 46 %   MCV 97.5 80.0 - 100.0 fL   MCH 31.1 26.0 - 34.0 pg   MCHC 31.9 30.0 - 36.0 g/dL   RDW 62.8 31.5 - 17.6 %   Platelets 214 150 - 400 K/uL   nRBC 0.0 0.0 - 0.2 %    Comment: Performed at [facility], [address] [place], [place] [address]  Troponin I (High Sensitivity)     Status: Abnormal   Collection Time: 04/04/20 10:24 PM  Result Value Ref Range   Troponin I (High Sensitivity) 68 (H) <18 ng/L    Comment: (NOTE) Elevated high sensitivity troponin I (hsTnI) values and significant  changes across serial measurements may suggest ACS but many other  chronic and acute conditions are known to elevate hsTnI results.  Refer to the "Links" section for chest pain algorithms and additional  guidance. Performed  at [facility], [address], [place], [place] [address]   Lipase, blood     Status: None   Collection Time: 04/04/20 10:24 PM  Result Value Ref Range   Lipase 36 11 - 51 U/L    Comment: Performed at [facility], [address], [place], [place] [address]  Uric acid     Status: None   Collection Time: 04/04/20 10:24 PM  Result Value Ref Range   Uric Acid, Serum 5.6 2.5 - 7.1 mg/dL    Comment: Performed at [facility], [address], [place], [place] [address]  Hepatic function panel     Status: Abnormal   Collection Time: 04/04/20 10:24 PM  Result Value Ref Range   Total Protein 6.0 (L) 6.5 - 8.1 g/dL   Albumin 2.4 (L) 3.5 - 5.0 g/dL   AST 51 (H) 15 - 41 U/L   ALT 24 0 - 44 U/L   Alkaline Phosphatase 66 38 - 126 U/L   Total Bilirubin 1.3 (H) 0.3 - 1.2 mg/dL   Bilirubin, Direct 0.3 (H) 0.0 - 0.2 mg/dL   Indirect Bilirubin 1.0 (H) 0.3 - 0.9 mg/dL    Comment: Performed at [facility], [address], [place], [place] [address]  Troponin I (High Sensitivity)     Status: Abnormal   Collection Time: 04/04/20 11:55 PM  Result Value Ref Range   Troponin I (High Sensitivity) 74 (H) <18 ng/L    Comment: (NOTE) Elevated high sensitivity troponin I (hsTnI) values and significant  changes across serial measurements may suggest ACS but many other  chronic and acute conditions are known to elevate hsTnI results.  Refer to the "Links" section for chest pain algorithms and additional  guidance. Performed at [facility], [address], [place], [place] [address]   Protime-INR     Status: None   Collection Time: 04/04/20 11:55 PM  Result Value Ref Range   Prothrombin Time 13.5 11.4 - 15.2 seconds   INR 1.1 0.8 - 1.2    Comment: (NOTE) INR goal varies based on device and disease states. Performed at [facility], [address], [place], [place] [address]   SARS Coronavirus 2 by RT PCR (hospital order, performed in [facility] hospital lab)  Nasopharyngeal Nasopharyngeal Swab     Status: None   Collection Time: 04/05/20 12:22 AM   Specimen: Nasopharyngeal Swab  Result Value Ref Range   SARS Coronavirus 2 NEGATIVE NEGATIVE    Comment: (NOTE) SARS-CoV-2  target nucleic acids are NOT DETECTED.  The SARS-CoV-2 RNA is generally detectable in upper and lower respiratory specimens during the acute phase of infection. The lowest concentration of SARS-CoV-2 viral copies this assay can detect is 250 copies / mL. A negative result does not preclude SARS-CoV-2 infection and should not be used as the sole basis for treatment or other patient management decisions.  A negative result may occur with improper specimen collection / handling, submission of specimen other than nasopharyngeal swab, presence of viral mutation(s) within the areas targeted by this assay, and inadequate number of viral copies (<250 copies / mL). A negative result must be combined with clinical observations, patient history, and epidemiological information.  Fact Sheet for Patients:   BoilerBrush.com.cy  Fact Sheet for Healthcare Providers: https://pope.com/  This test is not yet approved or  cleared by the [place] FDA and has been authorized for detection and/or diagnosis of SARS-CoV-2 by FDA under an Emergency Use Authorization (EUA).  This EUA will remain in effect (meaning this test can be used) for the duration of the COVID-19 declaration under Section 564(b)(1) of the Act, 21 U.S.C. section 360bbb-3(b)(1), unless the authorization is terminated or revoked sooner.  Performed at [facility], [address] [place], [place] [address]   CBC     Status: Abnormal   Collection Time: 04/05/20  3:44 AM  Result Value Ref Range   WBC 9.0 4.0 - 10.5 K/uL   RBC 3.49 (L) 3.87 - 5.11 MIL/uL   Hemoglobin 10.8 (L) 12.0 - 15.0 g/dL   HCT 21.3 (L) 36 - 46 %   MCV 98.6 80.0 - 100.0 fL   MCH 30.9 26.0 -  34.0 pg   MCHC 31.4 30.0 - 36.0 g/dL   RDW 08.6 57.8 - 46.9 %   Platelets 209 150 - 400 K/uL   nRBC 0.0 0.0 - 0.2 %    Comment: Performed at [facility], [address] [place], [place] [address]  Creatinine, serum     Status: None   Collection Time: 04/05/20  3:44 AM  Result Value Ref Range   Creatinine, Ser 0.85 0.44 - 1.00 mg/dL   GFR calc non Af Amer >60 >60 mL/min   GFR calc Af Amer >60 >60 mL/min    Comment: Performed at [facility], [address] [place], [place] [address]  Troponin I (High Sensitivity)     Status: Abnormal   Collection Time: 04/05/20  3:44 AM  Result Value Ref Range   Troponin I (High Sensitivity) 71 (H) <18 ng/L    Comment: Performed at [facility], [address] [place], [place] [address]  MRSA PCR Screening     Status: None   Collection Time: 04/05/20  5:56 AM   Specimen: Nasopharyngeal  Result Value Ref Range   MRSA by PCR NEGATIVE NEGATIVE    Comment:        The GeneXpert MRSA Assay (FDA approved for NASAL specimens only), is one component of a comprehensive MRSA colonization surveillance program. It is not intended to diagnose MRSA infection nor to guide or monitor treatment for MRSA infections. Performed at [facility], [address] [place], [place] [address]   Troponin I (High Sensitivity)     Status: Abnormal   Collection Time: 04/05/20  6:08 AM  Result Value Ref Range   Troponin I (High Sensitivity) 67 (H) <18 ng/L    Comment: (NOTE) Elevated high sensitivity troponin I (  hsTnI) values and significant  changes across serial measurements may suggest ACS but many other  chronic and acute conditions are known to elevate hsTnI results.  Refer to the "Links" section for chest pain algorithms and additional  guidance. Performed at [facility], [address] [place], [place] [id number]     DG Chest 2 View  Result Date: 04/04/2020 CLINICAL DATA:  Chest pain and pressure. EXAM: CHEST - 2 VIEW  COMPARISON:  Radiograph 06/28/2019 FINDINGS: Mild cardiomegaly, unchanged. Unchanged mediastinal contours with aortic atherosclerosis and tortuosity. Chronic rightward tracheal deviation. Normal pulmonary vasculature. No focal airspace disease, pulmonary edema or pneumothorax. Chronic change of both shoulders. IMPRESSION: Stable mild cardiomegaly. No acute abnormality. Aortic Atherosclerosis (ICD10-I70.0). Electronically Signed   By: Narda RutherfordMelanie  Sanford M.D.   On: 04/04/2020 22:04   DG Knee 2 Views Right  Result Date: 04/04/2020 CLINICAL DATA:  86 year old female with knee and hip pain. No known injury. EXAM: RIGHT KNEE - 1-2 VIEW COMPARISON:  None. FINDINGS: Severe tricompartmental degenerative changes, very severe joint space loss and degenerative spurring at the lateral and patellofemoral compartments. Small to moderate joint effusion. Patella appears intact. No acute osseous abnormality identified. Superimposed calcified peripheral vascular disease. IMPRESSION: 1. Severe tricompartmental degenerative changes with small to moderate joint effusion. 2. Calcified peripheral vascular disease. Electronically Signed   By: Odessa FlemingH  Hall M.D.   On: 04/04/2020 23:03   DG Hip Unilat W or Wo Pelvis 2-3 Views Right  Result Date: 04/04/2020 CLINICAL DATA:  86 year old female with knee and hip pain. No known injury. EXAM: DG HIP (WITH OR WITHOUT PELVIS) 2-3V RIGHT COMPARISON:  Right hip series 11/29/2006. FINDINGS: Femoral heads are normally located. Bone mineralization is within normal limits for age. Hip joint spaces appear symmetric and normal for age. No pelvis fracture identified. Retained stool in the large bowel in the lower abdomen and pelvis. Vascular calcifications. Grossly intact proximal left femur. Intact proximal right femur. No acute osseous abnormality identified. IMPRESSION: No acute osseous abnormality identified about the right hip or pelvis. Electronically Signed   By: Odessa FlemingH  Hall M.D.   On: 04/04/2020 23:04     Review of Systems  HENT: Negative for ear discharge, ear pain, hearing loss and tinnitus.   Eyes: Negative for photophobia and pain.  Respiratory: Negative for cough and shortness of breath.   Cardiovascular: Negative for chest pain.  Gastrointestinal: Negative for abdominal pain, nausea and vomiting.  Genitourinary: Negative for dysuria, flank pain, frequency and urgency.  Musculoskeletal: Positive for arthralgias (Right knee) and joint swelling. Negative for back pain, myalgias and neck pain.  Neurological: Negative for dizziness and headaches.  Hematological: Does not bruise/bleed easily.  Psychiatric/Behavioral: The patient is not nervous/anxious.    Blood pressure (!) 186/73, pulse 64, temperature 97.8 F (36.6 C), temperature source Oral, resp. rate 23, height 5\' 3"  (1.6 m), weight 57.2 kg, SpO2 100 %. Physical Exam Constitutional:      General: She is not in acute distress.    Appearance: She is well-developed. She is not diaphoretic.  HENT:     Head: Normocephalic and atraumatic.  Eyes:     General: No scleral icterus.       Right eye: No discharge.        Left eye: No discharge.     Conjunctiva/sclera: Conjunctivae normal.  Cardiovascular:     Rate and Rhythm: Normal rate and regular rhythm.  Pulmonary:     Effort: Pulmonary effort is normal. No respiratory distress.  Musculoskeletal:  Cervical back: Normal range of motion.     Comments: RLE No traumatic wounds, ecchymosis, or rash  Mild TTP knee  Mod knee effusion  Knee stable to varus/ valgus and anterior/posterior stress  Sens DPN, SPN, TN intact  Motor EHL, ext, flex, evers 5/5  DP 1+, PT 0, No significant edema  Skin:    General: Skin is warm and dry.  Neurological:     Mental Status: She is alert.  Psychiatric:        Behavior: Behavior normal.     Assessment/Plan: Right knee pain/effusion -- Performed arthrocentesis and injected with Marcaine and steroid. Hopefully this will supply some relief.   Multiple medical problems including PUD, CAD, HTN, and HLD -- per primary service    Freeman Caldron, PA-C Orthopedic Surgery [phone] 04/05/2020, 2:51 PM

## 2020-04-05 NOTE — ED Notes (Signed)
Admitting Provider at Bedside.  

## 2020-04-05 NOTE — Procedures (Signed)
Procedure: Right knee aspiration and injection  Indication: Right knee effusion(s)  Surgeon: Charma Igo, PA-C  Assist: None  Anesthesia: Topical refrigerant  EBL: None  Complications: None  Findings: After risks/benefits explained patient desires to undergo procedure. Consent obtained and time out performed. The right knee was sterilely prepped and aspirated. 58ml orange clear fluid obtained. 19ml 0.5% Marcaine and 80mg  depomedrol instilled. Pt tolerated the procedure well.    , PA-C Orthopedic Surgery [phone]

## 2020-04-05 NOTE — Plan of Care (Signed)
  Problem: Education: Goal: Knowledge of General Education information will improve Description: Including pain rating scale, medication(s)/side effects and non-pharmacologic comfort measures Outcome: Progressing   Problem: Clinical Measurements: Goal: Will remain free from infection Outcome: Progressing Goal: Diagnostic test results will improve Outcome: Progressing   Problem: Activity: Goal: Risk for activity intolerance will decrease Outcome: Progressing   Problem: Nutrition: Goal: Adequate nutrition will be maintained Outcome: Progressing   Problem: Coping: Goal: Level of anxiety will decrease Outcome: Progressing   

## 2020-04-05 NOTE — H&P (Signed)
History and Physical    Bonnie Morton WUJ:[id number] DOB: May 12, 1928 DOA: 04/04/2020  PCP: Andi Devon, MD  Patient coming from: Home.  Chief Complaint: Chest pain and right knee effusion.  HPI: Bonnie Morton is a 86 y.o. female with history of CAD status post stenting last cardiac cath on April 2019 after which medical management was recommended presents to the ER because of chest pain.  Patient states she has been having some worsening right knee effusion with increasing pain over the last few days.  Yesterday afternoon she started having some retrosternal chest pressure nonradiating with no shortness of breath fever chills or productive cough.  Patient called her primary care physician who advised her to come to the ER.  Denies abdominal pain nausea or vomiting.  Patient states she did have fall but did not hit her knee but since then the pain has been worsening.  EMS was called and patient was given sublingual nitroglycerin following which chest pain resolved.  ED Course: In the ER patient was chest pain-free EKG shows normal sinus rhythm with incomplete bundle branch block and high sensitive troponins were 66 and 74.  Chest x-ray unremarkable Covid test was negative.  X-ray of the right knee shows moderate right knee effusion.  Patient was afebrile and the knee does not look infected and not warm.  Lab work was significant for hemoglobin of 11.3 which appears to be baseline.  AST was mildly elevated at 51 total bilirubin 1.3 albumin 2.4.  Patient admitted for chest pain and right knee effusion.  Review of Systems: As per HPI, rest all negative.   Past Medical History:  Diagnosis Date  . Antral ulcer    chronic atrophic gastritis with intestinal metaplasia and surface erosion  . CAD (coronary artery disease)    Pt. develpoed chest burning w/raiation to her L arm /exertion in 08. She went to ER, found to have NSTEMI, LHC showed long 75-80% proximal to mid LAD stenosis w/99% ostial D2  stenosis and diffuse mild to moderate RCA disease. She had 2 Cypher stents to the proximal to mid LAD. Echo (5/11): EF 60-65%, grade 1 diastolic dysfunction, normal wall motion, no significant valvular abnormalities.   . Glaucoma   . H/O: hysterectomy   . HTN (hypertension)   . Hyperlipidemia   . Iron deficiency anemia    with normal colonoscopy last year per her report.   . Osteoarthritis of hip   . S/P subtotal thyroidectomy     Past Surgical History:  Procedure Laterality Date  . ABDOMINAL HYSTERECTOMY    . ESOPHAGOGASTRODUODENOSCOPY  08/03/2011   Procedure: ESOPHAGOGASTRODUODENOSCOPY (EGD);  Surgeon: Hart Carwin, MD;  Location: [facility];  Service: Endoscopy;  Laterality: N/A;  . EYE SURGERY     R eye cataract surgery  . LEFT HEART CATH AND CORONARY ANGIOGRAPHY N/A 12/25/2017   Procedure: LEFT HEART CATH AND CORONARY ANGIOGRAPHY;  Surgeon: Tonny Bollman, MD;  Location: [facility]IVE [facility] LAB;  Service: Cardiovascular;  Laterality: N/A;  . THYROIDECTOMY     subtotal     reports that she has never smoked. She has never used smokeless tobacco. She reports that she does not drink alcohol. No history on file for drug use.  No Known Allergies  Family History  Problem Relation Age of Onset  . Hypertension Mother   . Lung cancer Father     Prior to Admission medications   Medication Sig Start Date End Date Taking? Authorizing Provider  acetaminophen (TYLENOL) 500 MG tablet  Take 1,000 mg by mouth as needed for mild pain or headache.   Yes [provider]  amLODipine (NORVASC) 5 MG tablet Take 5 mg by mouth daily. 01/22/20  Yes [provider]  atorvastatin (LIPITOR) 10 MG tablet Take 10 mg by mouth daily. 10/26/17  Yes [provider]  clopidogrel (PLAVIX) 75 MG tablet Take 1 tablet (75 mg total) by mouth daily. 12/27/17  Yes Alison Murray, MD  metoprolol tartrate (LOPRESSOR) 25 MG tablet Take 0.5 tablets (12.5 mg total) by mouth 2 (two) times  daily. Patient taking differently: Take 25 mg by mouth daily at 12 noon.  06/29/19  Yes Joseph Art, DO  Multiple Vitamins-Minerals (CENTRUM SILVER ADULT 50+) TABS Take 1 tablet by mouth daily at 12 noon.   Yes [provider]  psyllium (METAMUCIL) 58.6 % packet Take 1 packet by mouth daily.   Yes [provider]  valsartan-hydrochlorothiazide (DIOVAN-HCT) 160-25 MG tablet Take 1 tablet by mouth daily. 01/22/20  Yes [provider]    Physical Exam: Constitutional: Moderately built and nourished. Vitals:   04/04/20 2121 04/04/20 2300 04/04/20 2315 04/05/20 0128  BP: (!) 160/104 (!) 137/67 127/71 (!) 132/102  Pulse: 73 68 64 69  Resp: 18 16 19 20   Temp: 98.4 F (36.9 C)     TempSrc: Oral     SpO2: 98% 99% 98% 99%  Weight:      Height:       Eyes: Anicteric no pallor. ENMT: No discharge from the ears eyes nose or mouth. Neck: No mass felt.  No neck rigidity. Respiratory: No rhonchi or crepitations. Cardiovascular: S1-S2 heard. Abdomen: Soft nontender bowel sounds present. Musculoskeletal: Right knee swollen and appears slightly deformed.  No obvious warmth or skin changes. Skin: No rash. Neurologic: Alert awake oriented to time place and person.  Moves all extremities. Psychiatric: Appears normal.  Normal affect.   Labs on Admission: I have personally reviewed following labs and imaging studies  CBC: Recent Labs  Lab 04/04/20 2224  WBC 9.3  HGB 11.3*  HCT 35.4*  MCV 97.5  PLT 214   Basic Metabolic Panel: Recent Labs  Lab 04/04/20 2224  NA 139  K 3.7  CL 105  CO2 25  GLUCOSE 159*  BUN 20  CREATININE 0.87  CALCIUM 9.0   GFR: Estimated Creatinine Clearance: 35.6 mL/min (by C-G formula based on SCr of 0.87 mg/dL). Liver Function Tests: Recent Labs  Lab 04/04/20 2224  AST 51*  ALT 24  ALKPHOS 66  BILITOT 1.3*  PROT 6.0*  ALBUMIN 2.4*   Recent Labs  Lab 04/04/20 2224  LIPASE 36   No results for input(s): AMMONIA in the  last 168 hours. Coagulation Profile: Recent Labs  Lab 04/04/20 2355  INR 1.1   Cardiac Enzymes: No results for input(s): CKTOTAL, CKMB, CKMBINDEX, TROPONINI in the last 168 hours. BNP (last 3 results) No results for input(s): PROBNP in the last 8760 hours. HbA1C: No results for input(s): HGBA1C in the last 72 hours. CBG: No results for input(s): GLUCAP in the last 168 hours. Lipid Profile: No results for input(s): CHOL, HDL, LDLCALC, TRIG, CHOLHDL, LDLDIRECT in the last 72 hours. Thyroid Function Tests: No results for input(s): TSH, T4TOTAL, FREET4, T3FREE, THYROIDAB in the last 72 hours. Anemia Panel: No results for input(s): VITAMINB12, FOLATE, FERRITIN, TIBC, IRON, RETICCTPCT in the last 72 hours. Urine analysis:    Component Value Date/Time   COLORURINE YELLOW 08/02/2011 1420   APPEARANCEUR CLEAR 08/02/2011 1420  LABSPEC 1.008 08/02/2011 1420   PHURINE 7.5 08/02/2011 1420   GLUCOSEU NEGATIVE 08/02/2011 1420   HGBUR NEGATIVE 08/02/2011 1420   BILIRUBINUR NEGATIVE 08/02/2011 1420   KETONESUR NEGATIVE 08/02/2011 1420   PROTEINUR NEGATIVE 08/02/2011 1420   UROBILINOGEN 0.2 08/02/2011 1420   NITRITE NEGATIVE 08/02/2011 1420   LEUKOCYTESUR NEGATIVE 08/02/2011 1420   Sepsis Labs: @LABRCNTIP (procalcitonin:4,lacticidven:4) ) Recent Results (from the past 240 hour(s))  SARS Coronavirus 2 by RT PCR (hospital order, [facility]) Nasopharyngeal Nasopharyngeal Swab     Status: None   Collection Time: 04/05/20 12:22 AM   Specimen: Nasopharyngeal Swab  Result Value Ref Range Status   SARS Coronavirus 2 NEGATIVE NEGATIVE Final    Comment: (NOTE) SARS-CoV-2 target nucleic acids are NOT DETECTED.  The SARS-CoV-2 RNA is generally detectable in upper and lower respiratory specimens during the acute phase of infection. The lowest concentration of SARS-CoV-2 viral copies this assay can detect is 250 copies / mL. A negative result does not preclude  SARS-CoV-2 infection and should not be used as the sole basis for treatment or other patient management decisions.  A negative result may occur with improper specimen collection / handling, submission of specimen other than nasopharyngeal swab, presence of viral mutation(s) within the areas targeted by this assay, and inadequate number of viral copies (<250 copies / mL). A negative result must be combined with clinical observations, patient history, and epidemiological information.  Fact Sheet for Patients:   BoilerBrush.com.cyhttps://www.fda.gov/media/136312/download  Fact Sheet for Healthcare Providers: https://pope.com/https://www.fda.gov/media/136313/download  This test is not yet approved or  cleared by the [place] FDA and has been authorized for detection and/or diagnosis of SARS-CoV-2 by FDA under an Emergency Use Authorization (EUA).  This EUA will remain in effect (meaning this test can be used) for the duration of the COVID-19 declaration under Section 564(b)(1) of the Act, 21 U.S.C. section 360bbb-3(b)(1), unless the authorization is terminated or revoked sooner.  Performed at [facility], [address] [place], [place] [address]      Radiological Exams on Admission: DG Chest 2 View  Result Date: 04/04/2020 CLINICAL DATA:  Chest pain and pressure. EXAM: CHEST - 2 VIEW COMPARISON:  Radiograph 06/28/2019 FINDINGS: Mild cardiomegaly, unchanged. Unchanged mediastinal contours with aortic atherosclerosis and tortuosity. Chronic rightward tracheal deviation. Normal pulmonary vasculature. No focal airspace disease, pulmonary edema or pneumothorax. Chronic change of both shoulders. IMPRESSION: Stable mild cardiomegaly. No acute abnormality. Aortic Atherosclerosis (ICD10-I70.0). Electronically Signed   By: Narda RutherfordMelanie  Sanford M.D.   On: 04/04/2020 22:04   DG Knee 2 Views Right  Result Date: 04/04/2020 CLINICAL DATA:  86 year old female with knee and hip pain. No known injury. EXAM: RIGHT KNEE - 1-2  VIEW COMPARISON:  None. FINDINGS: Severe tricompartmental degenerative changes, very severe joint space loss and degenerative spurring at the lateral and patellofemoral compartments. Small to moderate joint effusion. Patella appears intact. No acute osseous abnormality identified. Superimposed calcified peripheral vascular disease. IMPRESSION: 1. Severe tricompartmental degenerative changes with small to moderate joint effusion. 2. Calcified peripheral vascular disease. Electronically Signed   By: Odessa FlemingH  Hall M.D.   On: 04/04/2020 23:03   DG Hip Unilat W or Wo Pelvis 2-3 Views Right  Result Date: 04/04/2020 CLINICAL DATA:  86 year old female with knee and hip pain. No known injury. EXAM: DG HIP (WITH OR WITHOUT PELVIS) 2-3V RIGHT COMPARISON:  Right hip series 11/29/2006. FINDINGS: Femoral heads are normally located. Bone mineralization is within normal limits for age. Hip joint spaces appear  symmetric and normal for age. No pelvis fracture identified. Retained stool in the large bowel in the lower abdomen and pelvis. Vascular calcifications. Grossly intact proximal left femur. Intact proximal right femur. No acute osseous abnormality identified. IMPRESSION: No acute osseous abnormality identified about the right hip or pelvis. Electronically Signed   By: Odessa Fleming M.D.   On: 04/04/2020 23:04    EKG: Independently reviewed.  Normal sinus rhythm with incomplete bundle branch block.  Assessment/Plan Principal Problem:   Chest pain Active Problems:   Essential hypertension   CORONARY ATHEROSCLEROSIS NATIVE CORONARY ARTERY   Knee effusion, right    1. Chest pain with history of CAD status post stenting last cardiac cath in April 2019.  Chest pain resolved with sublingual nitroglycerin.  Will cycle cardiac markers.  Continue with Plavix, beta-blocker statin and as needed nitroglycerin.  Consult cardiology. 2. Right knee effusion likely from osteoarthritis.  No signs of infection.  Consult orthopedics in the  morning. 3. Hypertension on amlodipine ARB hydrochlorothiazide and beta-blocker. 4. Anemia appears to be chronic. 5. Hyperlipidemia on statins. 6. Mildly elevated LFTs further work-up as outpatient.   DVT prophylaxis: Heparin. Code Status: DNR. Family Communication: Discussed with patient. Disposition Plan: Home. Consults called: Cardiology. Admission status: Observation.   Eduard Clos MD [facility] Pager [phone].  If 7PM-7AM, please contact night-coverage [email or web address] Password [facility]  04/05/2020, 2:37 AM

## 2020-04-05 NOTE — ED Notes (Signed)
RN to room , pt in NAD at this time , blood work collected, pt turned to left side, pt denies any needs at this time or chest pain. Pt attached to cardiac monitor, bp cuff and pulse

## 2020-04-05 NOTE — Progress Notes (Signed)
Progress Note    Bonnie Morton  WUJ:[id number] DOB: 05/08/1928  DOA: 04/04/2020 PCP: Andi Devon, MD    Brief Narrative:    Medical records reviewed and are as summarized below:  Bonnie Morton is an 86 y.o. female with history of CAD status post stenting last cardiac cath on April 2019 after which medical management was recommended presents to the ER because of chest pain.  Patient states she has been having some worsening right knee effusion with increasing pain over the last few days.  Yesterday afternoon she started having some retrosternal chest pressure nonradiating with no shortness of breath fever chills or productive cough.  Patient called her primary care physician who advised her to come to the ER.   Assessment/Plan:   Principal Problem:   Chest pain Active Problems:   Essential hypertension   CORONARY ATHEROSCLEROSIS NATIVE CORONARY ARTERY   Knee effusion, right   Chest pain with history of CAD status post stenting last cardiac cath in April 2019.   -Chest pain resolved with sublingual nitroglycerin.   -CE flat but elevated -Continue with Plavix, beta-blocker statin and as needed nitroglycerin -cardiology consulted in ER  Right knee effusion  -likely from osteoarthritis.   -No signs of infection -may benefit from arthrocentesis -will schedule tylenol and use PRN oxy -voltaren gel Uric acid ordered and was normal  Constipation -bowel regimen  Hypertension  -amlodipine  -ARB  -beta-blocker.  Anemia appears to be chronic -defer to outpatient for work up  Hyperlipidemia  -on statins.   Patient lives at home with 3 sisters (all widowed).  She walks with a walker usually but has not been able due to knee pain PT consult in AM  Family Communication/Anticipated D/C date and plan/Code Status   DVT prophylaxis: heparin Code Status: DNR  Disposition Plan: Status is: Observation  The patient remains OBS appropriate and will d/c before 2  midnights.  Dispo: The patient is from: Home              Anticipated d/c is to: Home              Anticipated d/c date is: 1 day              Patient currently is not medically stable to d/c.         Medical Consultants:   Cards ortho    Subjective:   No further chest pain but c/o constipation and knee pain  Objective:    Vitals:   04/05/20 0345 04/05/20 0528 04/05/20 0714 04/05/20 1107  BP: 127/69 (!) 155/62 (!) 137/62 (!) 186/73  Pulse: 66 57 64   Resp: 19 17 14 23   Temp:  97.9 F (36.6 C) 97.6 F (36.4 C) 97.8 F (36.6 C)  TempSrc:  Oral Oral Oral  SpO2: 100% 100% 99% 100%  Weight:  57.2 kg    Height:  5\' 3"  (1.6 m)     No intake or output data in the 24 hours ending 04/05/20 1143 Filed Weights   04/04/20 2116 04/05/20 0528  Weight: 54 kg 57.2 kg    Exam: In bed, pleasant and cooperative rrr +BS, soft, mild tender in lower abd Right knee very tender and swollen, slightly warmer than left  Data Reviewed:   I have personally reviewed following labs and imaging studies:  Labs: Labs show the following:   Basic Metabolic Panel: Recent Labs  Lab 04/04/20 2224 04/05/20 0344  NA 139  --   K 3.7  --  CL 105  --   CO2 25  --   GLUCOSE 159*  --   BUN 20  --   CREATININE 0.87 0.85  CALCIUM 9.0  --    GFR Estimated Creatinine Clearance: 36.4 mL/min (by C-G formula based on SCr of 0.85 mg/dL). Liver Function Tests: Recent Labs  Lab 04/04/20 2224  AST 51*  ALT 24  ALKPHOS 66  BILITOT 1.3*  PROT 6.0*  ALBUMIN 2.4*   Recent Labs  Lab 04/04/20 2224  LIPASE 36   No results for input(s): AMMONIA in the last 168 hours. Coagulation profile Recent Labs  Lab 04/04/20 2355  INR 1.1    CBC: Recent Labs  Lab 04/04/20 2224 04/05/20 0344  WBC 9.3 9.0  HGB 11.3* 10.8*  HCT 35.4* 34.4*  MCV 97.5 98.6  PLT 214 209   Cardiac Enzymes: No results for input(s): CKTOTAL, CKMB, CKMBINDEX, TROPONINI in the last 168 hours. BNP (last 3  results) No results for input(s): PROBNP in the last 8760 hours. CBG: No results for input(s): GLUCAP in the last 168 hours. D-Dimer: No results for input(s): DDIMER in the last 72 hours. Hgb A1c: No results for input(s): HGBA1C in the last 72 hours. Lipid Profile: No results for input(s): CHOL, HDL, LDLCALC, TRIG, CHOLHDL, LDLDIRECT in the last 72 hours. Thyroid function studies: No results for input(s): TSH, T4TOTAL, T3FREE, THYROIDAB in the last 72 hours.  Invalid input(s): FREET3 Anemia work up: No results for input(s): VITAMINB12, FOLATE, FERRITIN, TIBC, IRON, RETICCTPCT in the last 72 hours. Sepsis Labs: Recent Labs  Lab 04/04/20 2224 04/05/20 0344  WBC 9.3 9.0    Microbiology Recent Results (from the past 240 hour(s))  SARS Coronavirus 2 by RT PCR (hospital order, performed in [facility] hospital lab) Nasopharyngeal Nasopharyngeal Swab     Status: None   Collection Time: 04/05/20 12:22 AM   Specimen: Nasopharyngeal Swab  Result Value Ref Range Status   SARS Coronavirus 2 NEGATIVE NEGATIVE Final    Comment: (NOTE) SARS-CoV-2 target nucleic acids are NOT DETECTED.  The SARS-CoV-2 RNA is generally detectable in upper and lower respiratory specimens during the acute phase of infection. The lowest concentration of SARS-CoV-2 viral copies this assay can detect is 250 copies / mL. A negative result does not preclude SARS-CoV-2 infection and should not be used as the sole basis for treatment or other patient management decisions.  A negative result may occur with improper specimen collection / handling, submission of specimen other than nasopharyngeal swab, presence of viral mutation(s) within the areas targeted by this assay, and inadequate number of viral copies (<250 copies / mL). A negative result must be combined with clinical observations, patient history, and epidemiological information.  Fact Sheet for Patients:    BoilerBrush.com.cy  Fact Sheet for Healthcare Providers: https://pope.com/  This test is not yet approved or  cleared by the [place] FDA and has been authorized for detection and/or diagnosis of SARS-CoV-2 by FDA under an Emergency Use Authorization (EUA).  This EUA will remain in effect (meaning this test can be used) for the duration of the COVID-19 declaration under Section 564(b)(1) of the Act, 21 U.S.C. section 360bbb-3(b)(1), unless the authorization is terminated or revoked sooner.  Performed at [facility], [address] [place], [place] [address]   MRSA PCR Screening     Status: None   Collection Time: 04/05/20  5:56 AM   Specimen: Nasopharyngeal  Result Value Ref Range Status   MRSA by PCR NEGATIVE  NEGATIVE Final    Comment:        The GeneXpert MRSA Assay (FDA approved for NASAL specimens only), is one component of a comprehensive MRSA colonization surveillance program. It is not intended to diagnose MRSA infection nor to guide or monitor treatment for MRSA infections. Performed at [facility], [address] [place], [place] [address]     Procedures and diagnostic studies:  DG Chest 2 View  Result Date: 04/04/2020 CLINICAL DATA:  Chest pain and pressure. EXAM: CHEST - 2 VIEW COMPARISON:  Radiograph 06/28/2019 FINDINGS: Mild cardiomegaly, unchanged. Unchanged mediastinal contours with aortic atherosclerosis and tortuosity. Chronic rightward tracheal deviation. Normal pulmonary vasculature. No focal airspace disease, pulmonary edema or pneumothorax. Chronic change of both shoulders. IMPRESSION: Stable mild cardiomegaly. No acute abnormality. Aortic Atherosclerosis (ICD10-I70.0). Electronically Signed   By: Narda Rutherford M.D.   On: 04/04/2020 22:04   DG Knee 2 Views Right  Result Date: 04/04/2020 CLINICAL DATA:  86 year old female with knee and hip pain. No known injury. EXAM: RIGHT KNEE -  1-2 VIEW COMPARISON:  None. FINDINGS: Severe tricompartmental degenerative changes, very severe joint space loss and degenerative spurring at the lateral and patellofemoral compartments. Small to moderate joint effusion. Patella appears intact. No acute osseous abnormality identified. Superimposed calcified peripheral vascular disease. IMPRESSION: 1. Severe tricompartmental degenerative changes with small to moderate joint effusion. 2. Calcified peripheral vascular disease. Electronically Signed   By: Odessa Fleming M.D.   On: 04/04/2020 23:03   DG Hip Unilat W or Wo Pelvis 2-3 Views Right  Result Date: 04/04/2020 CLINICAL DATA:  86 year old female with knee and hip pain. No known injury. EXAM: DG HIP (WITH OR WITHOUT PELVIS) 2-3V RIGHT COMPARISON:  Right hip series 11/29/2006. FINDINGS: Femoral heads are normally located. Bone mineralization is within normal limits for age. Hip joint spaces appear symmetric and normal for age. No pelvis fracture identified. Retained stool in the large bowel in the lower abdomen and pelvis. Vascular calcifications. Grossly intact proximal left femur. Intact proximal right femur. No acute osseous abnormality identified. IMPRESSION: No acute osseous abnormality identified about the right hip or pelvis. Electronically Signed   By: Odessa Fleming M.D.   On: 04/04/2020 23:04    Medications:   . acetaminophen  1,000 mg Oral TID  . amLODipine  5 mg Oral Daily  . atorvastatin  10 mg Oral Daily  . clopidogrel  75 mg Oral Daily  . diclofenac Sodium  2 g Topical QID  . heparin  5,000 Units Subcutaneous Q8H  . irbesartan  150 mg Oral Daily  . metoprolol tartrate  25 mg Oral Q1200  . senna  1 tablet Oral Daily   Continuous Infusions:   LOS: 0 days   Joseph Art  [facility]   How to contact the [facility] Attending or Consulting provider 7A - 7P or covering provider during after hours 7P -7A, for this patient?  1. Check the care team in [facility] and look for a) attending/consulting  TRH provider listed and b) the [facility] team listed 2. Log into [email or web address] and use [place]'s universal password to access. If you do not have the password, please contact the hospital operator. 3. Locate the [facility] provider you are looking for under [facility] and page to a number that you can be directly reached. 4. If you still have difficulty reaching the provider, please page the [facility] (Director on Call) for the Hospitalists listed on amion for assistance.  04/05/2020, 11:43 AM

## 2020-04-05 NOTE — Progress Notes (Signed)
While doing chart audit, accidentally documented on patient's fall injury prevention flowsheet cell. Removed entry.

## 2020-04-06 ENCOUNTER — Observation Stay (HOSPITAL_BASED_OUTPATIENT_CLINIC_OR_DEPARTMENT_OTHER): Payer: Medicare Other

## 2020-04-06 DIAGNOSIS — Z7902 Long term (current) use of antithrombotics/antiplatelets: Secondary | ICD-10-CM | POA: Diagnosis not present

## 2020-04-06 DIAGNOSIS — I25119 Atherosclerotic heart disease of native coronary artery with unspecified angina pectoris: Secondary | ICD-10-CM | POA: Diagnosis not present

## 2020-04-06 DIAGNOSIS — H409 Unspecified glaucoma: Secondary | ICD-10-CM | POA: Diagnosis present

## 2020-04-06 DIAGNOSIS — R072 Precordial pain: Secondary | ICD-10-CM

## 2020-04-06 DIAGNOSIS — Z8249 Family history of ischemic heart disease and other diseases of the circulatory system: Secondary | ICD-10-CM | POA: Diagnosis not present

## 2020-04-06 DIAGNOSIS — I454 Nonspecific intraventricular block: Secondary | ICD-10-CM | POA: Diagnosis present

## 2020-04-06 DIAGNOSIS — R079 Chest pain, unspecified: Secondary | ICD-10-CM

## 2020-04-06 DIAGNOSIS — K5900 Constipation, unspecified: Secondary | ICD-10-CM | POA: Diagnosis present

## 2020-04-06 DIAGNOSIS — E860 Dehydration: Secondary | ICD-10-CM | POA: Diagnosis present

## 2020-04-06 DIAGNOSIS — I10 Essential (primary) hypertension: Secondary | ICD-10-CM | POA: Diagnosis present

## 2020-04-06 DIAGNOSIS — Z20822 Contact with and (suspected) exposure to covid-19: Secondary | ICD-10-CM | POA: Diagnosis present

## 2020-04-06 DIAGNOSIS — R778 Other specified abnormalities of plasma proteins: Secondary | ICD-10-CM | POA: Diagnosis present

## 2020-04-06 DIAGNOSIS — Z8711 Personal history of peptic ulcer disease: Secondary | ICD-10-CM | POA: Diagnosis not present

## 2020-04-06 DIAGNOSIS — Z801 Family history of malignant neoplasm of trachea, bronchus and lung: Secondary | ICD-10-CM | POA: Diagnosis not present

## 2020-04-06 DIAGNOSIS — D509 Iron deficiency anemia, unspecified: Secondary | ICD-10-CM | POA: Diagnosis present

## 2020-04-06 DIAGNOSIS — E785 Hyperlipidemia, unspecified: Secondary | ICD-10-CM | POA: Diagnosis present

## 2020-04-06 DIAGNOSIS — R0789 Other chest pain: Secondary | ICD-10-CM | POA: Diagnosis not present

## 2020-04-06 DIAGNOSIS — Z90710 Acquired absence of both cervix and uterus: Secondary | ICD-10-CM | POA: Diagnosis not present

## 2020-04-06 DIAGNOSIS — I2510 Atherosclerotic heart disease of native coronary artery without angina pectoris: Secondary | ICD-10-CM | POA: Diagnosis present

## 2020-04-06 DIAGNOSIS — Z955 Presence of coronary angioplasty implant and graft: Secondary | ICD-10-CM | POA: Diagnosis not present

## 2020-04-06 DIAGNOSIS — M11261 Other chondrocalcinosis, right knee: Secondary | ICD-10-CM | POA: Diagnosis present

## 2020-04-06 DIAGNOSIS — I252 Old myocardial infarction: Secondary | ICD-10-CM | POA: Diagnosis not present

## 2020-04-06 DIAGNOSIS — R000 Tachycardia, unspecified: Secondary | ICD-10-CM | POA: Diagnosis not present

## 2020-04-06 DIAGNOSIS — Z66 Do not resuscitate: Secondary | ICD-10-CM | POA: Diagnosis present

## 2020-04-06 DIAGNOSIS — M25461 Effusion, right knee: Secondary | ICD-10-CM | POA: Diagnosis present

## 2020-04-06 DIAGNOSIS — W19XXXA Unspecified fall, initial encounter: Secondary | ICD-10-CM | POA: Diagnosis present

## 2020-04-06 DIAGNOSIS — Z79899 Other long term (current) drug therapy: Secondary | ICD-10-CM | POA: Diagnosis not present

## 2020-04-06 LAB — ECHOCARDIOGRAM COMPLETE
Area-P 1/2: 2.45 cm2
Height: 63 in
S' Lateral: 3.2 cm
Weight: 2017.65 oz

## 2020-04-06 MED ORDER — FLEET ENEMA 7-19 GM/118ML RE ENEM
1.0000 | ENEMA | Freq: Once | RECTAL | Status: AC
  Administered 2020-04-06: 1 via RECTAL
  Filled 2020-04-06: qty 1

## 2020-04-06 MED ORDER — POLYETHYLENE GLYCOL 3350 17 G PO PACK
17.0000 g | PACK | Freq: Once | ORAL | Status: AC
  Administered 2020-04-06: 17 g via ORAL
  Filled 2020-04-06: qty 1

## 2020-04-06 MED ORDER — METOPROLOL TARTRATE 25 MG PO TABS
25.0000 mg | ORAL_TABLET | Freq: Two times a day (BID) | ORAL | Status: DC
  Administered 2020-04-06 – 2020-04-07 (×3): 25 mg via ORAL
  Filled 2020-04-06 (×3): qty 1

## 2020-04-06 NOTE — Plan of Care (Signed)

## 2020-04-06 NOTE — Progress Notes (Signed)
  Echocardiogram 2D Echocardiogram has been performed.  Bonnie Morton Bonnie Morton 04/06/2020, 9:40 AM

## 2020-04-06 NOTE — TOC Initial Note (Signed)
Transition of Care [facility]) - Initial/Assessment Note    Patient Details  Name: Bonnie Morton MRN: [id number] Date of Birth: 14-Jun-1928  Transition of Care [facility]) CM/SW Contact:    Arvella Merles, [place] Phone Number:  04/06/2020, 3:23 PM  Clinical Narrative:                 CSW met with patient bedside to discuss PT recommendation for short-term SNF placement at discharge. Patient stated she lives with her younger and older sister in a single story home. She was mostly independent, only using a walker prior to arrival. Patient stated her niece lives across the street and assist with different things at the home when needed. Patient expressed understanding of recommendation for SNF, but declines at this time. Patient expressed she would like to return home with her sister, stating they support one another. Patient expressed she would like home health services at discharge. Patient provided permission for her niece Bonnie Morton to be contacted. TOC team will continue to follow.        Patient Goals and CMS Choice        Expected Discharge Plan and Services                                                Prior Living Arrangements/Services                       Activities of Daily Living Home Assistive Devices/Equipment: Gilford Rile (specify type) ADL Screening (condition at time of admission) Patient's cognitive ability adequate to safely complete daily activities?: Yes Is the patient deaf or have difficulty hearing?: No Does the patient have difficulty seeing, even when wearing glasses/contacts?: No Does the patient have difficulty concentrating, remembering, or making decisions?: No Patient able to express need for assistance with ADLs?: Yes Does the patient have difficulty dressing or bathing?: Yes Independently performs ADLs?: No Communication: Independent Dressing (OT): Needs assistance Is this a change from baseline?: Pre-admission baseline Grooming: Needs  assistance Is this a change from baseline?: Pre-admission baseline Feeding: Needs assistance Is this a change from baseline?: Pre-admission baseline Bathing: Needs assistance Is this a change from baseline?: Pre-admission baseline Toileting: Needs assistance Is this a change from baseline?: Pre-admission baseline In/Out Bed: Needs assistance Is this a change from baseline?: Pre-admission baseline Walks in Home: Needs assistance Is this a change from baseline?: Pre-admission baseline Does the patient have difficulty walking or climbing stairs?: Yes Weakness of Legs: Both Weakness of Arms/Hands: Both  Permission Sought/Granted                  Emotional Assessment              Admission diagnosis:  Effusion of right knee [M25.461] Chest pain [R07.9] Nonspecific chest pain [R07.9] Ambulatory dysfunction [R26.2] Patient Active Problem List   Diagnosis Date Noted   Knee effusion, right 04/05/2020   Near syncope 06/28/2019   Dehydration 06/28/2019   Hypotension 06/28/2019   CAD (coronary artery disease)    Chest pain 12/25/2017   Non-ST elevation (NSTEMI) myocardial infarction ([place])    Edema of lower extremity 08/05/2011   Ulcer, peptic, acute or chronic 08/03/2011   Anemia 08/01/2011   Constipation 08/01/2011   Hyperlipidemia 03/30/2011   Essential hypertension 01/10/2010   CORONARY ATHEROSCLEROSIS NATIVE CORONARY ARTERY 01/10/2010   PCP:  Karlton Lemon,  Joelene Millin, MD Pharmacy:   [facility] [place] - [address] [place]) [place] [id number] Phone: [phone] Fax: [phone]     Social Determinants of Health (SDOH) Interventions    Readmission Risk Interventions No flowsheet data found.

## 2020-04-06 NOTE — Evaluation (Signed)
Physical Therapy Evaluation Patient Details Name: Bonnie Morton MRN: [id number] DOB: [birth year] Today's Date: 04/06/2020   History of Present Illness  Pt adm with chest pain and rt knee effusion. Pt with recent fall at home. Pt received rt knee aspiration and injection on 04/05/20. PMH - CAD, cardiac stent, HTN, arthritis.   Clinical Impression  Eval limited by pt's high HR. Was 130-140's in supine and incr to 170-180 with sitting. Returned pt to supine at that time. Even with limited eval pt is weak and requiring assist. Fell at home earlier this week and per pt family with difficulty getting her up. Feel pt needs ST-SNF prior to returning home.     Follow Up Recommendations SNF    Equipment Recommendations  Other (comment) (To be determined)    Recommendations for Other Services       Precautions / Restrictions Precautions Precautions: Fall;Other (comment) Precaution Comments: watch HR      Mobility  Bed Mobility Overal bed mobility: Needs Assistance Bed Mobility: Supine to Sit;Sit to Supine     Supine to sit: Mod assist;HOB elevated Sit to supine: Mod assist;HOB elevated   General bed mobility comments: Assist to move RLE off of bed and to elevate trunk into sitting. Assist to lower trunk and bring legs back up into bed.  Transfers                 General transfer comment: Didn't attempt due to high HR.  Ambulation/Gait             General Gait Details: Didn't attempt due to high HR  Stairs            Wheelchair Mobility    Modified Rankin (Stroke Patients Only)       Balance Overall balance assessment: Needs assistance Sitting-balance support: Bilateral upper extremity supported;Feet supported Sitting balance-Leahy Scale: Poor Sitting balance - Comments: UE support and min assist for static standing Postural control: Posterior lean                                   Pertinent Vitals/Pain Pain Assessment: No/denies pain     Home Living Family/patient expects to be discharged to:: Private residence Living Arrangements: Other relatives;Other (Comment) Available Help at Discharge: Family;Available 24 hours/day Type of Home: House Home Access: Stairs to enter Entrance Stairs-Rails: Right Entrance Stairs-Number of Steps: 5 Home Layout: One level Home Equipment: Walker - 2 wheels;Cane - single point;Bedside commode Additional Comments: Info from prior encounter    Prior Function Level of Independence: Needs assistance   Gait / Transfers Assistance Needed: Per pt modified independent with walker in house           Hand Dominance        Extremity/Trunk Assessment   Upper Extremity Assessment Upper Extremity Assessment: Generalized weakness    Lower Extremity Assessment Lower Extremity Assessment: Generalized weakness       Communication   Communication: No difficulties  Cognition Arousal/Alertness: Awake/alert Behavior During Therapy: WFL for tasks assessed/performed Overall Cognitive Status: No family/caregiver present to determine baseline cognitive functioning                                 General Comments: Pt very tangential with information.      General Comments General comments (skin integrity, edema, etc.): Pt with HR 140's in supine. Sat EOB  with HR 171-181. Returned to supine. Instructed pt in ankle pumps and quad sets.    Exercises     Assessment/Plan    PT Assessment Patient needs continued PT services  PT Problem List Decreased strength;Decreased activity tolerance;Decreased balance;Decreased mobility;Cardiopulmonary status limiting activity       PT Treatment Interventions DME instruction;Gait training;Functional mobility training;Therapeutic activities;Therapeutic exercise;Balance training;Patient/family education    PT Goals (Current goals can be found in the Care Plan section)  Acute Rehab PT Goals Patient Stated Goal: return home PT Goal  Formulation: With patient Time For Goal Achievement: 04/13/20 Potential to Achieve Goals: Fair    Frequency Min 3X/week   Barriers to discharge Decreased caregiver support Lives with elderly sisters. One sister just home from SNF    Co-evaluation               AM-PAC PT "6 Clicks" Mobility  Outcome Measure Help needed turning from your back to your side while in a flat bed without using bedrails?: A Little Help needed moving from lying on your back to sitting on the side of a flat bed without using bedrails?: A Lot Help needed moving to and from a bed to a chair (including a wheelchair)?: Total Help needed standing up from a chair using your arms (e.g., wheelchair or bedside chair)?: Total Help needed to walk in hospital room?: Total Help needed climbing 3-5 steps with a railing? : Total 6 Click Score: 9    End of Session   Activity Tolerance: Treatment limited secondary to medical complications (Comment) (high HR) Patient left: in bed;with call bell/phone within reach;with bed alarm set Nurse Communication: Other (comment) (high HR and limited eval) PT Visit Diagnosis: Other abnormalities of gait and mobility (R26.89);History of falling (Z91.81)    Time: 9622-2979 PT Time Calculation (min) (ACUTE ONLY): 13 min   Charges:   PT Evaluation $PT Eval Moderate Complexity: 1 Mod          [facility] PT Acute Rehabilitation Services Pager [phone] Office [phone]   Angelina Ok [facility] 04/06/2020, 12:05 PM

## 2020-04-06 NOTE — Care Management Obs Status (Signed)
MEDICARE OBSERVATION STATUS NOTIFICATION   Patient Details  Name: Bonnie Morton MRN: [id number] Date of Birth: 12/23/1927   Medicare Observation Status Notification Given:  Yes    Leone Haven, RN 04/06/2020, 10:59 AM

## 2020-04-06 NOTE — Progress Notes (Signed)
Progress Note    [name]  BDZ:[id number] DOB: [birth year]  DOA: 04/04/2020 PCP: Andi Devon, MD    Brief Narrative:    Medical records reviewed and are as summarized below:  Bonnie Morton is an 86 y.o. female with history of CAD status post stenting last cardiac cath on April 2019 after which medical management was recommended presents to the ER because of chest pain.  Patient states she has been having some worsening right knee effusion with increasing pain over the last few days.  Yesterday afternoon she started having some retrosternal chest pressure nonradiating with no shortness of breath fever chills or productive cough.  Patient called her primary care physician who advised her to come to the ER.   Assessment/Plan:   Principal Problem:   Chest pain Active Problems:   Essential hypertension   CORONARY ATHEROSCLEROSIS NATIVE CORONARY ARTERY   Knee effusion, right   Chest pain with history of CAD status post stenting last cardiac cath in April 2019.   -Chest pain resolved with sublingual nitroglycerin.   -CE flat but elevated -Continue with Plavix, beta-blocker statin and as needed nitroglycerin -cardiology consult appreciated: echo read pending -patient's HR increased to 180s while working with PT-- slow to recover-- have adjusted her BB up for better control   Right knee effusion  -likely from osteoarthritis.   -elevated WBCs due to fibrin per report -s/p arthrocentesis and steroid injection -will schedule tylenol and use PRN oxy -voltaren gel -much improved this AM  Constipation -bowel regimen  Hypertension  -amlodipine  -ARB  -beta-blocker- adjust due to tachy cardia  Anemia appears to be chronic -defer to outpatient for work up  Hyperlipidemia  -on statins.   Patient lives at home with 3 sisters (all widowed).  She walks with a walker usually but has not been able due to knee pain PT consult in AM. PT recommends SNF but patient and niece  resistant to this as her sister just returned from [facility] and had a bad experience  Family Communication/Anticipated D/C date and plan/Code Status   DVT prophylaxis: heparin Code Status: DNR  Disposition Plan: Status is: Observation  Inpatient may be appropriate as she requires close monitor of her HR and frequent adjustment of her BB  Dispo: The patient is from: Home              Anticipated d/c is to: Home              Anticipated d/c date is: 1 day              Patient currently is not medically stable to d/c. needs adjustment of BB         Medical Consultants:   [facility] ortho    Subjective:   Does not want to go to SNF HR increased while working with PT  Objective:    Vitals:   04/06/20 0314 04/06/20 0720 04/06/20 0836 04/06/20 1144  BP: (!) 137/94  (!) 184/80 (!) 125/99  Pulse: 64 64 70 70  Resp: 16 18 17 18   Temp: 97.8 F (36.6 C)  97.9 F (36.6 C) 98.2 F (36.8 C)  TempSrc: Oral  Oral Oral  SpO2: 99% 100%    Weight:      Height:        Intake/Output Summary (Last 24 hours) at 04/06/2020 1408 Last data filed at 04/06/2020 0800 Gross per 24 hour  Intake 237 ml  Output 400 ml  Net -163 ml  Filed Weights   04/04/20 2116 04/05/20 0528  Weight: 54 kg 57.2 kg    Exam:   General: Appearance:    Well developed, well nourished female in no acute distress     Lungs:     Clear to auscultation bilaterally, respirations unlabored  Heart:    Normal heart rate. HR up to 180 with PT   MS:   All extremities are intact.   Neurologic:   Awake, alert, does have some tangential thinking         Data Reviewed:   I have personally reviewed following labs and imaging studies:  Labs:  Labs show the following:   Basic Metabolic Panel: Recent Labs  Lab 04/04/20 2224 04/05/20 0344  NA 139  --   K 3.7  --   CL 105  --   CO2 25  --   GLUCOSE 159*  --   BUN 20  --   CREATININE 0.87 0.85  CALCIUM 9.0  --    GFR Estimated Creatinine Clearance:  36.4 mL/min (by C-G formula based on SCr of 0.85 mg/dL). Liver Function Tests: Recent Labs  Lab 04/04/20 2224  AST 51*  ALT 24  ALKPHOS 66  BILITOT 1.3*  PROT 6.0*  ALBUMIN 2.4*   Recent Labs  Lab 04/04/20 2224  LIPASE 36   No results for input(s): AMMONIA in the last 168 hours. Coagulation profile Recent Labs  Lab 04/04/20 2355  INR 1.1    CBC: Recent Labs  Lab 04/04/20 2224 04/05/20 0344  WBC 9.3 9.0  HGB 11.3* 10.8*  HCT 35.4* 34.4*  MCV 97.5 98.6  PLT 214 209   Cardiac Enzymes: No results for input(s): CKTOTAL, CKMB, CKMBINDEX, TROPONINI in the last 168 hours. BNP (last 3 results) No results for input(s): PROBNP in the last 8760 hours. CBG: No results for input(s): GLUCAP in the last 168 hours. D-Dimer: No results for input(s): DDIMER in the last 72 hours. Hgb A1c: No results for input(s): HGBA1C in the last 72 hours. Lipid Profile: No results for input(s): CHOL, HDL, LDLCALC, TRIG, CHOLHDL, LDLDIRECT in the last 72 hours. Thyroid function studies: No results for input(s): TSH, T4TOTAL, T3FREE, THYROIDAB in the last 72 hours.  Invalid input(s): FREET3 Anemia work up: No results for input(s): VITAMINB12, FOLATE, FERRITIN, TIBC, IRON, RETICCTPCT in the last 72 hours. Sepsis Labs: Recent Labs  Lab 04/04/20 2224 04/05/20 0344  WBC 9.3 9.0    Microbiology Recent Results (from the past 240 hour(s))  SARS Coronavirus 2 by RT PCR (hospital order, performed in [facility] hospital lab) Nasopharyngeal Nasopharyngeal Swab     Status: None   Collection Time: 04/05/20 12:22 AM   Specimen: Nasopharyngeal Swab  Result Value Ref Range Status   SARS Coronavirus 2 NEGATIVE NEGATIVE Final    Comment: (NOTE) SARS-CoV-2 target nucleic acids are NOT DETECTED.  The SARS-CoV-2 RNA is generally detectable in upper and lower respiratory specimens during the acute phase of infection. The lowest concentration of SARS-CoV-2 viral copies this assay can detect is  250 copies / mL. A negative result does not preclude SARS-CoV-2 infection and should not be used as the sole basis for treatment or other patient management decisions.  A negative result may occur with improper specimen collection / handling, submission of specimen other than nasopharyngeal swab, presence of viral mutation(s) within the areas targeted by this assay, and inadequate number of viral copies (<250 copies / mL). A negative result must be combined with clinical observations, patient history,  and epidemiological information.  Fact Sheet for Patients:   BoilerBrush.com.cy  Fact Sheet for Healthcare Providers: https://pope.com/  This test is not yet approved or  cleared by the [place] FDA and has been authorized for detection and/or diagnosis of SARS-CoV-2 by FDA under an Emergency Use Authorization (EUA).  This EUA will remain in effect (meaning this test can be used) for the duration of the COVID-19 declaration under Section 564(b)(1) of the Act, 21 U.S.C. section 360bbb-3(b)(1), unless the authorization is terminated or revoked sooner.  Performed at [facility], [address] [place], [place] [address]   MRSA PCR Screening     Status: None   Collection Time: 04/05/20  5:56 AM   Specimen: Nasopharyngeal  Result Value Ref Range Status   MRSA by PCR NEGATIVE NEGATIVE Final    Comment:        The GeneXpert MRSA Assay (FDA approved for NASAL specimens only), is one component of a comprehensive MRSA colonization surveillance program. It is not intended to diagnose MRSA infection nor to guide or monitor treatment for MRSA infections. Performed at [facility], [address] [place], [place] [address]   Body fluid culture     Status: None (Preliminary result)   Collection Time: 04/05/20 12:32 PM   Specimen: Synovium; Body Fluid  Result Value Ref Range Status   Specimen Description SYNOVIAL FLUID   Final   Special Requests RIGHT KNEE  Final   Gram Stain   Final    ABUNDANT WBC PRESENT, PREDOMINANTLY PMN NO ORGANISMS SEEN    Culture   Final    NO GROWTH < 24 HOURS Performed at [facility], [address] [place], [place] [address]    Report Status PENDING  Incomplete    Procedures and diagnostic studies:  DG Chest 2 View  Result Date: 04/04/2020 CLINICAL DATA:  Chest pain and pressure. EXAM: CHEST - 2 VIEW COMPARISON:  Radiograph 06/28/2019 FINDINGS: Mild cardiomegaly, unchanged. Unchanged mediastinal contours with aortic atherosclerosis and tortuosity. Chronic rightward tracheal deviation. Normal pulmonary vasculature. No focal airspace disease, pulmonary edema or pneumothorax. Chronic change of both shoulders. IMPRESSION: Stable mild cardiomegaly. No acute abnormality. Aortic Atherosclerosis (ICD10-I70.0). Electronically Signed   By: Narda Rutherford M.D.   On: 04/04/2020 22:04   DG Knee 2 Views Right  Result Date: 04/04/2020 CLINICAL DATA:  86 year old female with knee and hip pain. No known injury. EXAM: RIGHT KNEE - 1-2 VIEW COMPARISON:  None. FINDINGS: Severe tricompartmental degenerative changes, very severe joint space loss and degenerative spurring at the lateral and patellofemoral compartments. Small to moderate joint effusion. Patella appears intact. No acute osseous abnormality identified. Superimposed calcified peripheral vascular disease. IMPRESSION: 1. Severe tricompartmental degenerative changes with small to moderate joint effusion. 2. Calcified peripheral vascular disease. Electronically Signed   By: Odessa Fleming M.D.   On: 04/04/2020 23:03   ECHOCARDIOGRAM COMPLETE  Result Date: 04/06/2020    ECHOCARDIOGRAM REPORT   Patient Name:   Bonnie Morton Date of Exam: 04/06/2020 Medical Rec #:  [id number]      Height:       63.0 in Accession #:    [id number]     Weight:       126.1 lb Date of Birth:  29-Dec-1927       BSA:          1.589 m Patient Age:    89 years       BP:  184/80 mmHg Patient Gender: F              HR:           83 bpm. Exam Location:  Inpatient Procedure: 2D Echo, Cardiac Doppler and Color Doppler Indications:     R07.9* Chest pain, unspecified  History:         Patient has no prior history of Echocardiogram examinations.                  CAD; Risk Factors:Hypertension and Dyslipidemia.  Sonographer:     Elmarie Shileyiffany Dance Referring Phys:  [id number] Roe RutherfordANGELA NICOLE DUKE Diagnosing Phys: Armanda Magicraci Turner MD IMPRESSIONS  1. Left ventricular ejection fraction, by estimation, is 60 to 65%. The left ventricle has normal function. The left ventricle has no regional wall motion abnormalities. Left ventricular diastolic parameters are consistent with Grade I diastolic dysfunction (impaired relaxation).  2. Right ventricular systolic function is normal. The right ventricular size is normal. Tricuspid regurgitation signal is inadequate for assessing PA pressure.  3. Left atrial size was mild to moderately dilated.  4. The mitral valve is normal in structure. No evidence of mitral valve regurgitation. No evidence of mitral stenosis.  5. The aortic valve is normal in structure. Aortic valve regurgitation is not visualized. No aortic stenosis is present.  6. The inferior vena cava is normal in size with greater than 50% respiratory variability, suggesting right atrial pressure of 3 mmHg. FINDINGS  Left Ventricle: Left ventricular ejection fraction, by estimation, is 60 to 65%. The left ventricle has normal function. The left ventricle has no regional wall motion abnormalities. The left ventricular internal cavity size was normal in size. There is  no left ventricular hypertrophy. Left ventricular diastolic parameters are consistent with Grade I diastolic dysfunction (impaired relaxation). Right Ventricle: The right ventricular size is normal. No increase in right ventricular wall thickness. Right ventricular systolic function is normal. Tricuspid regurgitation signal is inadequate for  assessing PA pressure. Left Atrium: Left atrial size was mild to moderately dilated. Right Atrium: Right atrial size was normal in size. Pericardium: There is no evidence of pericardial effusion. Mitral Valve: The mitral valve is normal in structure. Normal mobility of the mitral valve leaflets. No evidence of mitral valve regurgitation. No evidence of mitral valve stenosis. Tricuspid Valve: The tricuspid valve is normal in structure. Tricuspid valve regurgitation is not demonstrated. No evidence of tricuspid stenosis. Aortic Valve: The aortic valve is normal in structure. Aortic valve regurgitation is not visualized. No aortic stenosis is present. Pulmonic Valve: The pulmonic valve was normal in structure. Pulmonic valve regurgitation is not visualized. No evidence of pulmonic stenosis. Aorta: The aortic root is normal in size and structure. Venous: The inferior vena cava is normal in size with greater than 50% respiratory variability, suggesting right atrial pressure of 3 mmHg. IAS/Shunts: The interatrial septum appears to be lipomatous. No atrial level shunt detected by color flow Doppler.  LEFT VENTRICLE PLAX 2D LVIDd:         4.10 cm LVIDs:         3.20 cm LV PW:         1.60 cm LV IVS:        0.80 cm LVOT diam:     1.90 cm LV SV:         53 LV SV Index:   33 LVOT Area:     2.84 cm  RIGHT VENTRICLE          IVC RV  Basal diam:  2.80 cm  IVC diam: 0.80 cm TAPSE (M-mode): 1.8 cm LEFT ATRIUM             Index       RIGHT ATRIUM           Index LA diam:        4.00 cm 2.52 cm/m  RA Area:     10.00 cm LA Vol (A2C):   82.2 ml 51.72 ml/m RA Volume:   20.20 ml  12.71 ml/m LA Vol (A4C):   50.7 ml 31.90 ml/m LA Biplane Vol: 67.4 ml 42.40 ml/m  AORTIC VALVE LVOT Vmax:   94.85 cm/s LVOT Vmean:  62.800 cm/s LVOT VTI:    0.186 m  AORTA Ao Root diam: 3.20 cm MITRAL VALVE MV Area (PHT): 2.45 cm     SHUNTS MV Decel Time: 310 msec     Systemic VTI:  0.19 m MV E velocity: 65.50 cm/s   Systemic Diam: 1.90 cm MV A velocity:  122.00 cm/s MV E/A ratio:  0.54 Armanda Magic MD Electronically signed by Armanda Magic MD Signature Date/Time: 04/06/2020/11:46:06 AM    Final (Updated)    DG Hip Unilat W or Wo Pelvis 2-3 Views Right  Result Date: 04/04/2020 CLINICAL DATA:  86 year old female with knee and hip pain. No known injury. EXAM: DG HIP (WITH OR WITHOUT PELVIS) 2-3V RIGHT COMPARISON:  Right hip series 11/29/2006. FINDINGS: Femoral heads are normally located. Bone mineralization is within normal limits for age. Hip joint spaces appear symmetric and normal for age. No pelvis fracture identified. Retained stool in the large bowel in the lower abdomen and pelvis. Vascular calcifications. Grossly intact proximal left femur. Intact proximal right femur. No acute osseous abnormality identified. IMPRESSION: No acute osseous abnormality identified about the right hip or pelvis. Electronically Signed   By: Odessa Fleming M.D.   On: 04/04/2020 23:04    Medications:   . acetaminophen  1,000 mg Oral TID  . amLODipine  5 mg Oral Daily  . atorvastatin  10 mg Oral Daily  . bupivacaine  10 mL Infiltration Once  . clopidogrel  75 mg Oral Daily  . diclofenac Sodium  2 g Topical QID  . heparin  5,000 Units Subcutaneous Q8H  . irbesartan  150 mg Oral Daily  . methylPREDNISolone acetate  80 mg Intra-articular Once  . metoprolol tartrate  25 mg Oral BID  . senna  1 tablet Oral Daily   Continuous Infusions:   LOS: 0 days   Joseph Art  [facility]   How to contact the [facility] Attending or Consulting provider 7A - 7P or covering provider during after hours 7P -7A, for this patient?  1. Check the care team in [facility] and look for a) attending/consulting TR[facility] provider listed and b) the [facility] team listed 2. Log into [email or web address] and use [place]'s universal password to access. If you do not have the password, please contact the hospital operator. 3. Locate the [facility] provider you are looking for under [facility] and page to a number  that you can be directly reached. 4. If you still have difficulty reaching the provider, please page the [facility] (Director on Call) for the Hospitalists listed on amion for assistance.  04/06/2020, 2:08 PM

## 2020-04-06 NOTE — Consult Note (Signed)
Progress Note  Patient Name: Bonnie Morton Date of Encounter: 04/06/2020  [facility] Cardiologist: Tobias Alexander, MD   Subjective   No chest pain. Had a "fair" night overall. Blood pressure up this AM but she hasn't yet had her medicine. About to get her echo.   Inpatient Medications    Scheduled Meds: . acetaminophen  1,000 mg Oral TID  . amLODipine  5 mg Oral Daily  . atorvastatin  10 mg Oral Daily  . bupivacaine  10 mL Infiltration Once  . clopidogrel  75 mg Oral Daily  . diclofenac Sodium  2 g Topical QID  . heparin  5,000 Units Subcutaneous Q8H  . irbesartan  150 mg Oral Daily  . methylPREDNISolone acetate  80 mg Intra-articular Once  . metoprolol tartrate  25 mg Oral Q1200  . senna  1 tablet Oral Daily   Continuous Infusions:  PRN Meds: bisacodyl, ondansetron (ZOFRAN) IV, oxyCODONE   Vital Signs    Vitals:   04/05/20 2312 04/06/20 0314 04/06/20 0720 04/06/20 0836  BP: 127/83 (!) 137/94  (!) 184/80  Pulse: 63 64 64 70  Resp: 15 16 18 17   Temp: 97.7 F (36.5 C) 97.8 F (36.6 C)  97.9 F (36.6 C)  TempSrc: Oral Oral  Oral  SpO2: 96% 99% 100%   Weight:      Height:        Intake/Output Summary (Last 24 hours) at 04/06/2020 0904 Last data filed at 04/06/2020 0800 Gross per 24 hour  Intake 237 ml  Output 400 ml  Net -163 ml   Last 3 Weights 04/05/2020 04/04/2020 06/29/2019  Weight (lbs) 126 lb 1.7 oz 119 lb 0.8 oz 118 lb 13.3 oz  Weight (kg) 57.2 kg 54 kg 53.9 kg      Telemetry    NSR with occasional PVCs - Personally Reviewed  ECG    SR, PACs, RBBB, LAFB - Personally Reviewed  Physical Exam   GEN: No acute distress.   Neck: No JVD Cardiac: RRR, no murmurs, rubs, or gallops.  Respiratory: Clear to auscultation bilaterally. GI: Soft, nontender, non-distended  MS: No edema; No deformity. Neuro:  Nonfocal  Psych: Normal affect   Labs    High Sensitivity Troponin:   Recent Labs  Lab 04/04/20 2224 04/04/20 2355 04/05/20 0344  04/05/20 0608  TROPONINIHS 68* 74* 71* 67*      Chemistry Recent Labs  Lab 04/04/20 2224 04/05/20 0344  NA 139  --   K 3.7  --   CL 105  --   CO2 25  --   GLUCOSE 159*  --   BUN 20  --   CREATININE 0.87 0.85  CALCIUM 9.0  --   PROT 6.0*  --   ALBUMIN 2.4*  --   AST 51*  --   ALT 24  --   ALKPHOS 66  --   BILITOT 1.3*  --   GFRNONAA 59* >60  GFRAA >60 >60  ANIONGAP 9  --      Hematology Recent Labs  Lab 04/04/20 2224 04/05/20 0344  WBC 9.3 9.0  RBC 3.63* 3.49*  HGB 11.3* 10.8*  HCT 35.4* 34.4*  MCV 97.5 98.6  MCH 31.1 30.9  MCHC 31.9 31.4  RDW 12.9 13.0  PLT 214 209    BNPNo results for input(s): BNP, PROBNP in the last 168 hours.   DDimer No results for input(s): DDIMER in the last 168 hours.   Radiology    DG Chest 2 View  Result Date: 04/04/2020 CLINICAL DATA:  Chest pain and pressure. EXAM: CHEST - 2 VIEW COMPARISON:  Radiograph 06/28/2019 FINDINGS: Mild cardiomegaly, unchanged. Unchanged mediastinal contours with aortic atherosclerosis and tortuosity. Chronic rightward tracheal deviation. Normal pulmonary vasculature. No focal airspace disease, pulmonary edema or pneumothorax. Chronic change of both shoulders. IMPRESSION: Stable mild cardiomegaly. No acute abnormality. Aortic Atherosclerosis (ICD10-I70.0). Electronically Signed   By: Narda Rutherford M.D.   On: 04/04/2020 22:04   DG Knee 2 Views Right  Result Date: 04/04/2020 CLINICAL DATA:  86 year old female with knee and hip pain. No known injury. EXAM: RIGHT KNEE - 1-2 VIEW COMPARISON:  None. FINDINGS: Severe tricompartmental degenerative changes, very severe joint space loss and degenerative spurring at the lateral and patellofemoral compartments. Small to moderate joint effusion. Patella appears intact. No acute osseous abnormality identified. Superimposed calcified peripheral vascular disease. IMPRESSION: 1. Severe tricompartmental degenerative changes with small to moderate joint effusion. 2.  Calcified peripheral vascular disease. Electronically Signed   By: Odessa Fleming M.D.   On: 04/04/2020 23:03   DG Hip Unilat W or Wo Pelvis 2-3 Views Right  Result Date: 04/04/2020 CLINICAL DATA:  86 year old female with knee and hip pain. No known injury. EXAM: DG HIP (WITH OR WITHOUT PELVIS) 2-3V RIGHT COMPARISON:  Right hip series 11/29/2006. FINDINGS: Femoral heads are normally located. Bone mineralization is within normal limits for age. Hip joint spaces appear symmetric and normal for age. No pelvis fracture identified. Retained stool in the large bowel in the lower abdomen and pelvis. Vascular calcifications. Grossly intact proximal left femur. Intact proximal right femur. No acute osseous abnormality identified. IMPRESSION: No acute osseous abnormality identified about the right hip or pelvis. Electronically Signed   By: Odessa Fleming M.D.   On: 04/04/2020 23:04    Cardiac Studies   Echo pending today  Patient Profile     86 y.o. female with PMH CAD with prior PCI, hypertension, hyperlipidemia who is being followed in consultation for chest pain at the request of Dr. Benjamine Mola  Assessment & Plan    Chest pain:  -prior history of CAD, with remote PCI -hsTnI only slightly elevated and flat, not consistent with ACS -no further chest pain -will get echo for further evaluation -continue clopidogrel -continue statin -continue metoprolol, is 12.5 mg BID at home -no plans for invasive ischemia evaluation at this time  Hypertension: good numbers after receiving meds, but elevated this AM prior to meds -continue amlodipine 5 mg. May need to increase to 10 if remains elevated post discharge -on valsartan-HCTZ at home, on formulary agents while admitted  Hyperlipidemia: -continue atorvastatin 10 mg daily (given age, will not intensify)  [facility] will sign off.   Medication Recommendations:  No changes Other recommendations (labs, testing, etc): None Follow up as an outpatient:  We will  arrange for outpatient follow up with Dr. Lindaann Slough team.  For questions or updates, please contact [facility] Please consult [email or web address] for contact info under     Signed, Jodelle Red, MD  04/06/2020, 9:04 AM

## 2020-04-07 DIAGNOSIS — I2510 Atherosclerotic heart disease of native coronary artery without angina pectoris: Secondary | ICD-10-CM

## 2020-04-07 LAB — BASIC METABOLIC PANEL
Anion gap: 9 (ref 5–15)
BUN: 46 mg/dL — ABNORMAL HIGH (ref 8–23)
CO2: 26 mmol/L (ref 22–32)
Calcium: 9.9 mg/dL (ref 8.9–10.3)
Chloride: 101 mmol/L (ref 98–111)
Creatinine, Ser: 1 mg/dL (ref 0.44–1.00)
GFR calc Af Amer: 57 mL/min — ABNORMAL LOW (ref >60)
GFR calc non Af Amer: 50 mL/min — ABNORMAL LOW (ref >60)
Glucose, Bld: 190 mg/dL — ABNORMAL HIGH (ref 70–99)
Potassium: 4.6 mmol/L (ref 3.5–5.1)
Sodium: 136 mmol/L (ref 135–145)

## 2020-04-07 LAB — CBC
HCT: 34.3 % — ABNORMAL LOW (ref 36.0–46.0)
Hemoglobin: 11.2 g/dL — ABNORMAL LOW (ref 12.0–15.0)
MCH: 32.1 pg (ref 26.0–34.0)
MCHC: 32.7 g/dL (ref 30.0–36.0)
MCV: 98.3 fL (ref 80.0–100.0)
Platelets: 269 10*3/uL (ref 150–400)
RBC: 3.49 MIL/uL — ABNORMAL LOW (ref 3.87–5.11)
RDW: 13 % (ref 11.5–15.5)
WBC: 8.1 10*3/uL (ref 4.0–10.5)
nRBC: 0 % (ref 0.0–0.2)

## 2020-04-07 MED ORDER — BISACODYL 10 MG RE SUPP: 10.0000 mg | Freq: Every day | RECTAL | 0 refills | Status: AC | PRN

## 2020-04-07 MED ORDER — DICLOFENAC SODIUM 1 % EX GEL: 2.0000 g | Freq: Four times a day (QID) | CUTANEOUS | Status: AC

## 2020-04-07 MED ORDER — METOPROLOL TARTRATE 25 MG PO TABS: 25.0000 mg | ORAL_TABLET | Freq: Two times a day (BID) | ORAL | Status: AC

## 2020-04-07 NOTE — TOC Transition Note (Addendum)
Transition of Care [facility]) - CM/SW Discharge Note   Patient Details  Name: Kariel Skillman MRN: [id number] Date of Birth: 26-Apr-1928  Transition of [facility]) CM/SW Contact:  Lawerance Sabal, RN Phone Number: 04/07/2020, 12:20 PM   Clinical Narrative:    Patient would like to transition to home w [facility] services. Frances Furbish accepts referral. Patient has RW, declines need for additional DME. Patient requestes PTAR to [facility], she states she lives with 2 sisters and they or her niece will home to receive her. PTAR forms printed to unit. Requested RN to verify that she has DNR to send w PTAR.   Spoke w niece to verify plan, she is agreeable Dorothy Puffer Niece   [phone]   Notified of additional PT recs. SPoke w niece regarding these, she is agreeable to [facility] and sliding board. Requested [employer] to deliver to the home.     Final next level of care: Home w Home Health Services Barriers to Discharge: No Barriers Identified   Patient Goals and CMS Choice Patient states their goals for this hospitalization and ongoing recovery are:: to go home CMS Medicare.gov Compare Post Acute Care list provided to:: Patient Choice offered to / list presented to : Patient  Discharge Placement                       Discharge Plan and Services In-house Referral: Clinical Social Work   Post Acute Care Choice: Home Health                    HH Arranged: PT, OT, Social Work [facility] Agency: [facility] Date [facility] Agency Contacted: 04/07/20 Time HH Agency Contacted: 1220 Representative spoke with at [facility] Agency: Frances Furbish  Social Determinants of Health (SDOH) Interventions     Readmission Risk Interventions No flowsheet data found.

## 2020-04-07 NOTE — Discharge Summary (Signed)
Physician Discharge Summary  Bonnie Morton BHA:[id number] DOB: 1928/02/02 DOA: 04/04/2020  PCP: Andi Devon, MD  Admit date: 04/04/2020 Discharge date: 04/07/2020  Admitted From: home Discharge disposition: home   Recommendations for Outpatient Follow-Up:   1. Refused SNF placement 2. Home health plus DME 3. HgbA1c at next office visit   Discharge Diagnosis:   Principal Problem:   Chest pain Active Problems:   Essential hypertension   CORONARY ATHEROSCLEROSIS NATIVE CORONARY ARTERY   Knee effusion, right    Discharge Condition: Improved.  Diet recommendation: Low sodium, heart healthy.    Wound care: None.  Code status: Full.   History of Present Illness:  Bonnie Morton is a 86 y.o. female with history of CAD status post stenting last cardiac cath on April 2019 after which medical management was recommended presents to the ER because of chest pain.  Patient states she has been having some worsening right knee effusion with increasing pain over the last few days.  Yesterday afternoon she started having some retrosternal chest pressure nonradiating with no shortness of breath fever chills or productive cough.  Patient called her primary care physician who advised her to come to the ER.  Denies abdominal pain nausea or vomiting.  Patient states she did have fall but did not hit her knee but since then the pain has been worsening.  EMS was called and patient was given sublingual nitroglycerin following which chest pain resolved.  ED Course: In the ER patient was chest pain-free EKG shows normal sinus rhythm with incomplete bundle branch block and high sensitive troponins were 66 and 74.  Chest x-ray unremarkable Covid test was negative.  X-ray of the right knee shows moderate right knee effusion.  Patient was afebrile and the knee does not look infected and not warm.  Lab work was significant for hemoglobin of 11.3 which appears to be baseline.  AST was mildly  elevated at 51 total bilirubin 1.3 albumin 2.4.  Patient admitted for chest pain and right knee effusion.   Hospital Course by Problem:   Chest pain with history of CAD status post stenting last cardiac cath in April 2019.  -Chest pain resolved with sublingual nitroglycerin.  -CE flat but elevated -Continue with Plavix, beta-blocker statin and as needed nitroglycerin -cardiology consult appreciated: echo done  Right knee effusion  -likely from osteoarthritis.  -elevated WBCs due to fibrin per report -s/p arthrocentesis and steroid injection -will schedule tylenol and use PRN oxy -voltaren gel -much improved   Constipation -bowel regimen  Hypertension  -amlodipine  -ARB  -beta-blocker- adjusted due to tachy cardia  Anemia appears to be chronic -defer to outpatient for work up  Hyperlipidemia  -on statins.    Medical Consultants:   [facility]   Discharge Exam:   Vitals:   04/07/20 0755 04/07/20 1130  BP:    Pulse:    Resp:    Temp:  (!) 97.5 F (36.4 C)  SpO2: 99%    Vitals:   04/07/20 0400 04/07/20 0754 04/07/20 0755 04/07/20 1130  BP:  (!) 156/70    Pulse: 60 62    Resp: 18 16    Temp:  98.1 F (36.7 C)  (!) 97.5 F (36.4 C)  TempSrc:  Oral  Oral  SpO2: 98% 99% 99%   Weight:      Height:        General exam: Appears calm and comfortable.   The results of significant diagnostics from this hospitalization (including imaging,  microbiology, ancillary and laboratory) are listed below for reference.     Procedures and Diagnostic Studies:   DG Chest 2 View  Result Date: 04/04/2020 CLINICAL DATA:  Chest pain and pressure. EXAM: CHEST - 2 VIEW COMPARISON:  Radiograph 06/28/2019 FINDINGS: Mild cardiomegaly, unchanged. Unchanged mediastinal contours with aortic atherosclerosis and tortuosity. Chronic rightward tracheal deviation. Normal pulmonary vasculature. No focal airspace disease, pulmonary edema or pneumothorax. Chronic change of both  shoulders. IMPRESSION: Stable mild cardiomegaly. No acute abnormality. Aortic Atherosclerosis (ICD10-I70.0). Electronically Signed   By: Narda RutherfordMelanie  Sanford M.D.   On: 04/04/2020 22:04   DG Knee 2 Views Right  Result Date: 04/04/2020 CLINICAL DATA:  86 year old female with knee and hip pain. No known injury. EXAM: RIGHT KNEE - 1-2 VIEW COMPARISON:  None. FINDINGS: Severe tricompartmental degenerative changes, very severe joint space loss and degenerative spurring at the lateral and patellofemoral compartments. Small to moderate joint effusion. Patella appears intact. No acute osseous abnormality identified. Superimposed calcified peripheral vascular disease. IMPRESSION: 1. Severe tricompartmental degenerative changes with small to moderate joint effusion. 2. Calcified peripheral vascular disease. Electronically Signed   By: Odessa FlemingH  Hall M.D.   On: 04/04/2020 23:03   DG Hip Unilat W or Wo Pelvis 2-3 Views Right  Result Date: 04/04/2020 CLINICAL DATA:  86 year old female with knee and hip pain. No known injury. EXAM: DG HIP (WITH OR WITHOUT PELVIS) 2-3V RIGHT COMPARISON:  Right hip series 11/29/2006. FINDINGS: Femoral heads are normally located. Bone mineralization is within normal limits for age. Hip joint spaces appear symmetric and normal for age. No pelvis fracture identified. Retained stool in the large bowel in the lower abdomen and pelvis. Vascular calcifications. Grossly intact proximal left femur. Intact proximal right femur. No acute osseous abnormality identified. IMPRESSION: No acute osseous abnormality identified about the right hip or pelvis. Electronically Signed   By: Odessa FlemingH  Hall M.D.   On: 04/04/2020 23:04     Labs:   Basic Metabolic Panel: Recent Labs  Lab 04/04/20 2224 04/05/20 0344 04/07/20 0054  NA 139  --  136  K 3.7  --  4.6  CL 105  --  101  CO2 25  --  26  GLUCOSE 159*  --  190*  BUN 20  --  46*  CREATININE 0.87 0.85 1.00  CALCIUM 9.0  --  9.9   GFR Estimated Creatinine  Clearance: 30.9 mL/min (by C-G formula based on SCr of 1 mg/dL). Liver Function Tests: Recent Labs  Lab 04/04/20 2224  AST 51*  ALT 24  ALKPHOS 66  BILITOT 1.3*  PROT 6.0*  ALBUMIN 2.4*   Recent Labs  Lab 04/04/20 2224  LIPASE 36   No results for input(s): AMMONIA in the last 168 hours. Coagulation profile Recent Labs  Lab 04/04/20 2355  INR 1.1    CBC: Recent Labs  Lab 04/04/20 2224 04/05/20 0344 04/07/20 0054  WBC 9.3 9.0 8.1  HGB 11.3* 10.8* 11.2*  HCT 35.4* 34.4* 34.3*  MCV 97.5 98.6 98.3  PLT 214 209 269   Cardiac Enzymes: No results for input(s): CKTOTAL, CKMB, CKMBINDEX, TROPONINI in the last 168 hours. BNP: Invalid input(s): POCBNP CBG: No results for input(s): GLUCAP in the last 168 hours. D-Dimer No results for input(s): DDIMER in the last 72 hours. Hgb A1c No results for input(s): HGBA1C in the last 72 hours. Lipid Profile No results for input(s): CHOL, HDL, LDLCALC, TRIG, CHOLHDL, LDLDIRECT in the last 72 hours. Thyroid function studies No results for input(s): TSH, T4TOTAL, T3FREE,  THYROIDAB in the last 72 hours.  Invalid input(s): FREET3 Anemia work up No results for input(s): VITAMINB12, FOLATE, FERRITIN, TIBC, IRON, RETICCTPCT in the last 72 hours. Microbiology Recent Results (from the past 240 hour(s))  SARS Coronavirus 2 by RT PCR (hospital order, performed in [facility] hospital lab) Nasopharyngeal Nasopharyngeal Swab     Status: None   Collection Time: 04/05/20 12:22 AM   Specimen: Nasopharyngeal Swab  Result Value Ref Range Status   SARS Coronavirus 2 NEGATIVE NEGATIVE Final    Comment: (NOTE) SARS-CoV-2 target nucleic acids are NOT DETECTED.  The SARS-CoV-2 RNA is generally detectable in upper and lower respiratory specimens during the acute phase of infection. The lowest concentration of SARS-CoV-2 viral copies this assay can detect is 250 copies / mL. A negative result does not preclude SARS-CoV-2 infection and should not  be used as the sole basis for treatment or other patient management decisions.  A negative result may occur with improper specimen collection / handling, submission of specimen other than nasopharyngeal swab, presence of viral mutation(s) within the areas targeted by this assay, and inadequate number of viral copies (<250 copies / mL). A negative result must be combined with clinical observations, patient history, and epidemiological information.  Fact Sheet for Patients:   BoilerBrush.com.cy  Fact Sheet for Healthcare Providers: https://pope.com/  This test is not yet approved or  cleared by the [place] FDA and has been authorized for detection and/or diagnosis of SARS-CoV-2 by FDA under an Emergency Use Authorization (EUA).  This EUA will remain in effect (meaning this test can be used) for the duration of the COVID-19 declaration under Section 564(b)(1) of the Act, 21 U.S.C. section 360bbb-3(b)(1), unless the authorization is terminated or revoked sooner.  Performed at [facility], [address] [place], [place] [address]   MRSA PCR Screening     Status: None   Collection Time: 04/05/20  5:56 AM   Specimen: Nasopharyngeal  Result Value Ref Range Status   MRSA by PCR NEGATIVE NEGATIVE Final    Comment:        The GeneXpert MRSA Assay (FDA approved for NASAL specimens only), is one component of a comprehensive MRSA colonization surveillance program. It is not intended to diagnose MRSA infection nor to guide or monitor treatment for MRSA infections. Performed at [facility], [address], [place], [place] [address]   Body fluid culture     Status: None (Preliminary result)   Collection Time: 04/05/20 12:32 PM   Specimen: Synovium; Body Fluid  Result Value Ref Range Status   Specimen Description SYNOVIAL FLUID  Final   Special Requests RIGHT KNEE  Final   Gram Stain   Final    ABUNDANT WBC PRESENT,  PREDOMINANTLY PMN NO ORGANISMS SEEN    Culture   Final    NO GROWTH 2 DAYS Performed at [facility], [address] [place], [place] [address]    Report Status PENDING  Incomplete     Discharge Instructions:   Discharge Instructions    Diet - low sodium heart healthy   Complete by: As directed    Discharge instructions   Complete by: As directed    HgbA1c at next PCP visit     Allergies as of 04/07/2020   No Known Allergies     Medication List    TAKE these medications   acetaminophen 500 MG tablet Commonly known as: TYLENOL Take 1,000 mg by mouth as needed for mild  pain or headache.   amLODipine 5 MG tablet Commonly known as: NORVASC Take 5 mg by mouth daily.   atorvastatin 10 MG tablet Commonly known as: LIPITOR Take 10 mg by mouth daily.   bisacodyl 10 MG suppository Commonly known as: DULCOLAX Place 1 suppository (10 mg total) rectally daily as needed for moderate constipation or severe constipation.   Centrum Silver Adult 50+ Tabs Take 1 tablet by mouth daily at 12 noon.   clopidogrel 75 MG tablet Commonly known as: PLAVIX Take 1 tablet (75 mg total) by mouth daily.   diclofenac Sodium 1 % Gel Commonly known as: VOLTAREN Apply 2 g topically 4 (four) times daily.   metoprolol tartrate 25 MG tablet Commonly known as: LOPRESSOR Take 1 tablet (25 mg total) by mouth 2 (two) times daily. What changed: how much to take   psyllium 58.6 % packet Commonly known as: METAMUCIL Take 1 packet by mouth daily.   valsartan-hydrochlorothiazide 160-25 MG tablet Commonly known as: DIOVAN-HCT Take 1 tablet by mouth daily.       Follow-up Information    Leone Brand, NP Follow up on 05/07/2020.   Specialties: Cardiology, Radiology Why: Please arrive 15 minutes early for your 10:45am post-hospital cardiology appointment Contact information: [address] [place] [address] [phone]        Andi Devon, MD Follow up in 1  week(s).   Specialty: Internal Medicine Contact information: [address] [place] [address] [phone]                Time coordinating discharge: 35 min  Signed:  Joseph Art DO  [facility] 04/07/2020, 12:02 PM

## 2020-04-07 NOTE — Care Management (Cosign Needed)
    Durable Medical Equipment  (From admission, onward)         Start     Ordered   04/07/20 1320  For home use only DME lightweight manual wheelchair with seat cushion  Once       Comments: Patient suffers from weakness which impairs their ability to perform daily activities like toileting in the home.  A walker will not resolve  issue with performing activities of daily living. A wheelchair will allow patient to safely perform daily activities. Patient is not able to propel themselves in the home using a standard weight wheelchair due to general weakness. Patient can self propel in the lightweight wheelchair. Length of need Lifetime. Accessories: elevating leg rests (ELRs), wheel locks, extensions and anti-tippers. 16 x16 size   04/07/20 1320   04/07/20 1243  For home use only DME Other see comment  Once       Comments: Sliding board  Question:  Length of Need  Answer:  Lifetime   04/07/20 1243   04/07/20 1242  For home use only DME 3 n 1  Once       Comments: Drop arm   04/07/20 1243

## 2020-04-07 NOTE — Progress Notes (Addendum)
   Progress Note  Patient Name: Bonnie Morton Date of Encounter: 04/07/2020  [facility] Cardiologist: Tobias Alexander, MD   Pt feeling good this morning   Pt had echo done  Impression:  1. Left ventricular ejection fraction, by estimation, is 60 to 65%. The  left ventricle has normal function. The left ventricle has no regional  wall motion abnormalities. Left ventricular diastolic parameters are  consistent with Grade I diastolic  dysfunction (impaired relaxation).  2. Right ventricular systolic function is normal. The right ventricular  size is normal. Tricuspid regurgitation signal is inadequate for assessing  PA pressure.  3. Left atrial size was mild to moderately dilated.  4. The mitral valve is normal in structure. No evidence of mitral valve  regurgitation. No evidence of mitral stenosis.  5. The aortic valve is normal in structure. Aortic valve regurgitation is  not visualized. No aortic stenosis is present.  6. The inferior vena cava is normal in size with greater than 50%  respiratory variability, suggesting right atrial pressure of 3 mmHg.   Pt feeling bettter    I would continue medical Rx as recomm by B Cristal Deer Would not plan any further cardiac testing   Signed, Dietrich Pates, MD  04/07/2020, 7:06 AM

## 2020-04-07 NOTE — Progress Notes (Signed)
Physical Therapy Treatment Patient Details Name: Bonnie Morton MRN: [id number] DOB: 1928/04/25 Today's Date: 04/07/2020    History of Present Illness Pt adm with chest pain and rt knee effusion. Pt with recent fall at home. Pt received rt knee aspiration and injection on 04/05/20. PMH - CAD, cardiac stent, HTN, arthritis.     PT Comments    Pt HR better controlled this session.  She is unable to manage standing due to strong posterior lean and feet sliding in standing.  Pt continues to refuse snf and will require PTAR transport and equipment listed below.     Follow Up Recommendations  SNF (will require HHPT as she is refusing snf)     Equipment Recommendations  Wheelchair (measurements PT);Wheelchair cushion (measurements PT);3in1 (PT);Other (comment) (3:1 needs to be a drop arm commode.  She will also require a long length slide board.)    Recommendations for Other Services       Precautions / Restrictions Precautions Precautions: Fall;Other (comment) Precaution Comments: watch HR Restrictions Weight Bearing Restrictions: No    Mobility  Bed Mobility Overal bed mobility: Needs Assistance Bed Mobility: Supine to Sit;Sit to Supine     Supine to sit: Mod assist     General bed mobility comments: Pt required assistance to move B LEs to edge of bed and elevate trunk into sitting.  Pt continued to require assistance to scoot hips forward to edge of bed.  Noted with posterior lean this session.  Transfers Overall transfer level: Needs assistance Equipment used: Rolling walker (2 wheeled);None Transfers: Sit to/from [employer] Sit to Stand: Max assist   Squat pivot transfers: Total assist     General transfer comment: Max assistance to rise into standing.  Pt required cues for hand placement and assistance to boost into standing.  Pt presents with poor balance and posterior lean.  Decreased dorsiflexion and legs sliding forward in standing.  Pt required  assistance to move from bed to recliner with total assistance to drop arm recliner.  Ambulation/Gait Ambulation/Gait assistance:  (NT unable as she cannot right balance in standing to safely take steps.)               Stairs             Wheelchair Mobility    Modified Rankin (Stroke Patients Only)       Balance Overall balance assessment: Needs assistance Sitting-balance support: Bilateral upper extremity supported;Feet supported Sitting balance-Leahy Scale: Poor Sitting balance - Comments: UE support and min assist for static standing     Standing balance-Leahy Scale: Poor                              Cognition Arousal/Alertness: Awake/alert Behavior During Therapy: WFL for tasks assessed/performed Overall Cognitive Status: No family/caregiver present to determine baseline cognitive functioning                                 General Comments: Pt very tangential with information.      Exercises      General Comments        Pertinent Vitals/Pain Pain Assessment: Faces Pain Location: B knees Pain Descriptors / Indicators: Discomfort;Sore Pain Intervention(s): Repositioned (volatren gel applied pre tx to B knees)    Home Living  Prior Function            PT Goals (current goals can now be found in the care plan section) Acute Rehab PT Goals Patient Stated Goal: return home Potential to Achieve Goals: Fair Progress towards PT goals: Progressing toward goals    Frequency    Min 3X/week      PT Plan Discharge plan needs to be updated    Co-evaluation              AM-PAC PT "6 Clicks" Mobility   Outcome Measure  Help needed turning from your back to your side while in a flat bed without using bedrails?: A Lot Help needed moving from lying on your back to sitting on the side of a flat bed without using bedrails?: A Lot Help needed moving to and from a bed to a chair (including  a wheelchair)?: Total Help needed standing up from a chair using your arms (e.g., wheelchair or bedside chair)?: Total Help needed to walk in hospital room?: Total Help needed climbing 3-5 steps with a railing? : Total 6 Click Score: 8    End of Session Equipment Utilized During Treatment: Gait belt Activity Tolerance: Treatment limited secondary to medical complications (Comment) Patient left: in bed;with call bell/phone within reach;with bed alarm set Nurse Communication: Mobility status PT Visit Diagnosis: Other abnormalities of gait and mobility (R26.89);History of falling (Z91.81)     Time: 1610-9604 PT Time Calculation (min) (ACUTE ONLY): 32 min  Charges:  $Therapeutic Activity: 23-37 mins                     Bonney Leitz , PTA Acute Rehabilitation Services Pager [phone] Office [phone]     Jensine Luz Artis Delay 04/07/2020, 1:46 PM

## 2020-04-08 LAB — BODY FLUID CULTURE: Culture: NO GROWTH

## 2020-05-06 NOTE — Progress Notes (Deleted)
Cardiology Office Note   Date:  05/06/2020   ID:  Bonnie Morton, DOB [birth year], MRN [id number]  PCP:  Bonnie Devon, MD  Cardiologist:  Bonnie Morton    No chief complaint on file.     History of Present Illness: Bonnie Morton is a 86 y.o. female who presents for *** post hospital   hx of CAD s/p PCI, HTN, HLD, subtotal thyroidectomy, and iron deficiency anemia  CAD s/p NSTEMI in 2008 with 2 cypher stents placed to proximal-mid LAD. Anginal symptoms included chest burning with left arm radiation while walking. She previously followed with Bonnie Morton (2012) and was last seen 12/2017 while hospitalized for chest pain. She underwent left heart cath at that time which revealed patent midLAD stent, moderate diffuse RCA stenosis,, widely paent left main and left Cx, but total occlusion of the apical LAD. There was some suspicion for an embolic event and heart monitor was planned as an outpatient.  It does not appear this was completed. She canceled her cardiology follow up appt and has not been seen since. She was seen in the ER 06/28/19 after an episode of dizziness and hypotension. She received IVF, suspected hypotension was due to dehydration. Her anti-hypertensives were held, including amlodipine and valsartan-HCTZ. She was continued on ASA, plavix, 10 mg lipitor, and 12.5 mg lopressor BID.  Admitted 04/05/20 with chest pain EKG stable troponin flat 68 to 74  Echo with EF60-65%G1DD  No further testing  Past Medical History:  Diagnosis Date  . Antral ulcer    chronic atrophic gastritis with intestinal metaplasia and surface erosion  . CAD (coronary artery disease)    Pt. develpoed chest burning w/raiation to her L arm /exertion in 08. She went to ER, found to have NSTEMI, LHC showed long 75-80% proximal to mid LAD stenosis w/99% ostial D2 stenosis and diffuse mild to moderate RCA disease. She had 2 Cypher stents to the proximal to mid LAD. Echo (5/11): EF 60-65%, grade 1 diastolic  dysfunction, normal wall motion, no significant valvular abnormalities.   . Glaucoma   . H/O: hysterectomy   . HTN (hypertension)   . Hyperlipidemia   . Iron deficiency anemia    with normal colonoscopy last year per her report.   . Osteoarthritis of hip   . S/P subtotal thyroidectomy     Past Surgical History:  Procedure Laterality Date  . ABDOMINAL HYSTERECTOMY    . ESOPHAGOGASTRODUODENOSCOPY  08/03/2011   Procedure: ESOPHAGOGASTRODUODENOSCOPY (EGD);  Surgeon: Bonnie Carwin, MD;  Location: [facility];  Service: Endoscopy;  Laterality: N/A;  . EYE SURGERY     R eye cataract surgery  . LEFT HEART CATH AND CORONARY ANGIOGRAPHY N/A 12/25/2017   Procedure: LEFT HEART CATH AND CORONARY ANGIOGRAPHY;  Surgeon: Bonnie Bollman, MD;  Location: [facility] INVASIVE CV LAB;  Service: Cardiovascular;  Laterality: N/A;  . THYROIDECTOMY     subtotal     Current Outpatient Medications  Medication Sig Dispense Refill  . acetaminophen (TYLENOL) 500 MG tablet Take 1,000 mg by mouth as needed for mild pain or headache.    Marland Kitchen amLODipine (NORVASC) 5 MG tablet Take 5 mg by mouth daily.    Marland Kitchen atorvastatin (LIPITOR) 10 MG tablet Take 10 mg by mouth daily.    . bisacodyl (DULCOLAX) 10 MG suppository Place 1 suppository (10 mg total) rectally daily as needed for moderate constipation or severe constipation. 12 suppository 0  . clopidogrel (PLAVIX) 75 MG tablet Take 1 tablet (75 mg total) by mouth daily.  30 tablet 0  . diclofenac Sodium (VOLTAREN) 1 % GEL Apply 2 g topically 4 (four) times daily.    . metoprolol tartrate (LOPRESSOR) 25 MG tablet Take 1 tablet (25 mg total) by mouth 2 (two) times daily.    . Multiple Vitamins-Minerals (CENTRUM SILVER ADULT 50+) TABS Take 1 tablet by mouth daily at 12 noon.    . psyllium (METAMUCIL) 58.6 % packet Take 1 packet by mouth daily.    . valsartan-hydrochlorothiazide (DIOVAN-HCT) 160-25 MG tablet Take 1 tablet by mouth daily.     No current facility-administered  medications for this visit.    Allergies:   Patient has no known allergies.    Social History:  The patient  reports that she has never smoked. She has never used smokeless tobacco. She reports that she does not drink alcohol.   Family History:  The patient's ***family history includes Hypertension in her mother; Lung cancer in her father.    ROS:  General:no colds or fevers, no weight changes Skin:no rashes or ulcers HEENT:no blurred vision, no congestion CV:Morton HPI PUL:Morton HPI GI:no diarrhea constipation or melena, no indigestion GU:no hematuria, no dysuria MS:no joint pain, no claudication Neuro:no syncope, no lightheadedness Endo:no diabetes, no thyroid disease Wt Readings from Last 3 Encounters:  04/05/20 126 lb 1.7 oz (57.2 kg)  06/29/19 118 lb 13.3 oz (53.9 kg)  12/27/17 123 lb 8 oz (56 kg)     PHYSICAL EXAM: VS:  There were no vitals taken for this visit. , BMI There is no height or weight on file to calculate BMI. General:Pleasant affect, NAD Skin:Warm and dry, brisk capillary refill HEENT:normocephalic, sclera clear, mucus membranes moist Neck:supple, no JVD, no bruits  Heart:S1S2 RRR without murmur, gallup, rub or click Lungs:clear without rales, rhonchi, or wheezes BTD:HRCB, non tender, + BS, do not palpate liver spleen or masses Ext:no lower ext edema, 2+ pedal pulses, 2+ radial pulses Neuro:alert and oriented, MAE, follows commands, + facial symmetry    EKG:  EKG is ordered today. The ekg ordered today demonstrates ***   Recent Labs: 06/28/2019: TSH 1.558 04/04/2020: ALT 24 04/07/2020: BUN 46; Creatinine, Ser 1.00; Hemoglobin 11.2; Platelets 269; Potassium 4.6; Sodium 136    Lipid Panel    Component Value Date/Time   CHOL 121 12/26/2017 0043   TRIG 68 12/26/2017 0043   HDL 48 12/26/2017 0043   CHOLHDL 2.5 12/26/2017 0043   VLDL 14 12/26/2017 0043   LDLCALC 59 12/26/2017 0043       Other studies Reviewed: Additional studies/ records that  were reviewed today include: ***.   ASSESSMENT AND PLAN:  1.  ***   Current medicines are reviewed with the patient today.  The patient Has no concerns regarding medicines.  The following changes have been made:  Morton above Labs/ tests ordered today include:Morton above  Disposition:   FU:  Morton above  Signed, Nada Boozer, NP  05/06/2020 10:53 PM    [facility] [address] [place], [place], [place]  [address]/ [address] [place], [place] Phone: [phone]; Fax: [phone]  [phone]

## 2020-05-07 ENCOUNTER — Ambulatory Visit: Payer: Medicare Other | Admitting: Cardiology

## 2020-07-25 ENCOUNTER — Inpatient Hospital Stay (HOSPITAL_COMMUNITY)
Admission: EM | Admit: 2020-07-25 | Discharge: 2020-08-07 | DRG: 682 | Disposition: A | Discharge status: Skilled Nursing Facility | Payer: Medicare Other | Attending: Internal Medicine | Admitting: Internal Medicine

## 2020-07-25 ENCOUNTER — Other Ambulatory Visit: Payer: Self-pay

## 2020-07-25 ENCOUNTER — Emergency Department (HOSPITAL_COMMUNITY): Payer: Medicare Other

## 2020-07-25 ENCOUNTER — Encounter (HOSPITAL_COMMUNITY): Payer: Self-pay | Admitting: Emergency Medicine

## 2020-07-25 DIAGNOSIS — E43 Unspecified severe protein-calorie malnutrition: Secondary | ICD-10-CM | POA: Diagnosis present

## 2020-07-25 DIAGNOSIS — Z955 Presence of coronary angioplasty implant and graft: Secondary | ICD-10-CM | POA: Diagnosis not present

## 2020-07-25 DIAGNOSIS — N179 Acute kidney failure, unspecified: Secondary | ICD-10-CM | POA: Diagnosis not present

## 2020-07-25 DIAGNOSIS — I493 Ventricular premature depolarization: Secondary | ICD-10-CM | POA: Diagnosis present

## 2020-07-25 DIAGNOSIS — E890 Postprocedural hypothyroidism: Secondary | ICD-10-CM | POA: Diagnosis present

## 2020-07-25 DIAGNOSIS — E860 Dehydration: Secondary | ICD-10-CM | POA: Diagnosis present

## 2020-07-25 DIAGNOSIS — K5900 Constipation, unspecified: Secondary | ICD-10-CM | POA: Diagnosis present

## 2020-07-25 DIAGNOSIS — I951 Orthostatic hypotension: Secondary | ICD-10-CM | POA: Diagnosis present

## 2020-07-25 DIAGNOSIS — Z8249 Family history of ischemic heart disease and other diseases of the circulatory system: Secondary | ICD-10-CM

## 2020-07-25 DIAGNOSIS — E785 Hyperlipidemia, unspecified: Secondary | ICD-10-CM | POA: Diagnosis present

## 2020-07-25 DIAGNOSIS — E871 Hypo-osmolality and hyponatremia: Secondary | ICD-10-CM | POA: Diagnosis not present

## 2020-07-25 DIAGNOSIS — Z7902 Long term (current) use of antithrombotics/antiplatelets: Secondary | ICD-10-CM

## 2020-07-25 DIAGNOSIS — Z66 Do not resuscitate: Secondary | ICD-10-CM | POA: Diagnosis present

## 2020-07-25 DIAGNOSIS — R001 Bradycardia, unspecified: Secondary | ICD-10-CM | POA: Diagnosis present

## 2020-07-25 DIAGNOSIS — R778 Other specified abnormalities of plasma proteins: Secondary | ICD-10-CM | POA: Diagnosis present

## 2020-07-25 DIAGNOSIS — M810 Age-related osteoporosis without current pathological fracture: Secondary | ICD-10-CM | POA: Diagnosis present

## 2020-07-25 DIAGNOSIS — Z23 Encounter for immunization: Secondary | ICD-10-CM | POA: Diagnosis not present

## 2020-07-25 DIAGNOSIS — Z801 Family history of malignant neoplasm of trachea, bronchus and lung: Secondary | ICD-10-CM

## 2020-07-25 DIAGNOSIS — Z8711 Personal history of peptic ulcer disease: Secondary | ICD-10-CM

## 2020-07-25 DIAGNOSIS — Z9181 History of falling: Secondary | ICD-10-CM | POA: Diagnosis not present

## 2020-07-25 DIAGNOSIS — R531 Weakness: Principal | ICD-10-CM

## 2020-07-25 DIAGNOSIS — Z90710 Acquired absence of both cervix and uterus: Secondary | ICD-10-CM

## 2020-07-25 DIAGNOSIS — R54 Age-related physical debility: Secondary | ICD-10-CM

## 2020-07-25 DIAGNOSIS — I10 Essential (primary) hypertension: Secondary | ICD-10-CM | POA: Diagnosis present

## 2020-07-25 DIAGNOSIS — I2510 Atherosclerotic heart disease of native coronary artery without angina pectoris: Secondary | ICD-10-CM | POA: Diagnosis present

## 2020-07-25 DIAGNOSIS — I252 Old myocardial infarction: Secondary | ICD-10-CM

## 2020-07-25 DIAGNOSIS — K219 Gastro-esophageal reflux disease without esophagitis: Secondary | ICD-10-CM | POA: Diagnosis present

## 2020-07-25 DIAGNOSIS — Z79899 Other long term (current) drug therapy: Secondary | ICD-10-CM

## 2020-07-25 DIAGNOSIS — Z20822 Contact with and (suspected) exposure to covid-19: Secondary | ICD-10-CM | POA: Diagnosis present

## 2020-07-25 DIAGNOSIS — Z681 Body mass index (BMI) 19 or less, adult: Secondary | ICD-10-CM | POA: Diagnosis not present

## 2020-07-25 DIAGNOSIS — E875 Hyperkalemia: Secondary | ICD-10-CM | POA: Diagnosis not present

## 2020-07-25 DIAGNOSIS — R630 Anorexia: Secondary | ICD-10-CM

## 2020-07-25 DIAGNOSIS — H409 Unspecified glaucoma: Secondary | ICD-10-CM | POA: Diagnosis present

## 2020-07-25 DIAGNOSIS — E861 Hypovolemia: Secondary | ICD-10-CM | POA: Diagnosis not present

## 2020-07-25 DIAGNOSIS — R269 Unspecified abnormalities of gait and mobility: Secondary | ICD-10-CM | POA: Diagnosis present

## 2020-07-25 LAB — BASIC METABOLIC PANEL
Anion gap: 14 (ref 5–15)
BUN: 58 mg/dL — ABNORMAL HIGH (ref 8–23)
CO2: 20 mmol/L — ABNORMAL LOW (ref 22–32)
Calcium: 10.8 mg/dL — ABNORMAL HIGH (ref 8.9–10.3)
Chloride: 102 mmol/L (ref 98–111)
Creatinine, Ser: 1.15 mg/dL — ABNORMAL HIGH (ref 0.44–1.00)
GFR, Estimated: 45 mL/min — ABNORMAL LOW (ref >60)
Glucose, Bld: 143 mg/dL — ABNORMAL HIGH (ref 70–99)
Potassium: 5.7 mmol/L — ABNORMAL HIGH (ref 3.5–5.1)
Sodium: 136 mmol/L (ref 135–145)

## 2020-07-25 LAB — CBC
HCT: 35.9 % — ABNORMAL LOW (ref 36.0–46.0)
Hemoglobin: 11.1 g/dL — ABNORMAL LOW (ref 12.0–15.0)
MCH: 30.8 pg (ref 26.0–34.0)
MCHC: 30.9 g/dL (ref 30.0–36.0)
MCV: 99.7 fL (ref 80.0–100.0)
Platelets: 284 10*3/uL (ref 150–400)
RBC: 3.6 MIL/uL — ABNORMAL LOW (ref 3.87–5.11)
RDW: 13 % (ref 11.5–15.5)
WBC: 8.7 10*3/uL (ref 4.0–10.5)
nRBC: 0 % (ref 0.0–0.2)

## 2020-07-25 LAB — URINALYSIS, ROUTINE W REFLEX MICROSCOPIC
Bacteria, UA: NONE SEEN
Bilirubin Urine: NEGATIVE
Glucose, UA: NEGATIVE mg/dL
Hgb urine dipstick: NEGATIVE
Ketones, ur: 5 mg/dL — AB
Leukocytes,Ua: NEGATIVE
Nitrite: NEGATIVE
Protein, ur: 30 mg/dL — AB
Specific Gravity, Urine: 1.015 (ref 1.005–1.030)
pH: 5 (ref 5.0–8.0)

## 2020-07-25 LAB — HEPATIC FUNCTION PANEL
ALT: 18 U/L (ref 0–44)
AST: 54 U/L — ABNORMAL HIGH (ref 15–41)
Albumin: 3.4 g/dL — ABNORMAL LOW (ref 3.5–5.0)
Alkaline Phosphatase: 74 U/L (ref 38–126)
Bilirubin, Direct: 0.6 mg/dL — ABNORMAL HIGH (ref 0.0–0.2)
Indirect Bilirubin: 1.3 mg/dL — ABNORMAL HIGH (ref 0.3–0.9)
Total Bilirubin: 1.9 mg/dL — ABNORMAL HIGH (ref 0.3–1.2)
Total Protein: 7.6 g/dL (ref 6.5–8.1)

## 2020-07-25 LAB — TROPONIN I (HIGH SENSITIVITY)
Troponin I (High Sensitivity): 74 ng/L — ABNORMAL HIGH (ref <18)
Troponin I (High Sensitivity): 81 ng/L — ABNORMAL HIGH (ref <18)
Troponin I (High Sensitivity): 77 ng/L — ABNORMAL HIGH (ref <18)
Troponin I (High Sensitivity): 78 ng/L — ABNORMAL HIGH (ref <18)

## 2020-07-25 LAB — LIPASE, BLOOD: Lipase: 38 U/L (ref 11–51)

## 2020-07-25 LAB — CK: Total CK: 475 U/L — ABNORMAL HIGH (ref 38–234)

## 2020-07-25 LAB — CBG MONITORING, ED: Glucose-Capillary: 121 mg/dL — ABNORMAL HIGH (ref 70–99)

## 2020-07-25 LAB — RESPIRATORY PANEL BY RT PCR (FLU A&B, COVID)
Influenza A by PCR: NEGATIVE
Influenza B by PCR: NEGATIVE
SARS Coronavirus 2 by RT PCR: NEGATIVE

## 2020-07-25 IMAGING — DX DG CHEST 1V PORT
1 series · 1 of 1 positions shown · non-contrast
Comparison: Prior chest radiographs 04/04/2020 and earlier.

CLINICAL DATA: Generalized weakness. Additional provided: Dizziness
and nausea since yesterday afternoon.

EXAM:
PORTABLE CHEST 1 VIEW

[chest]
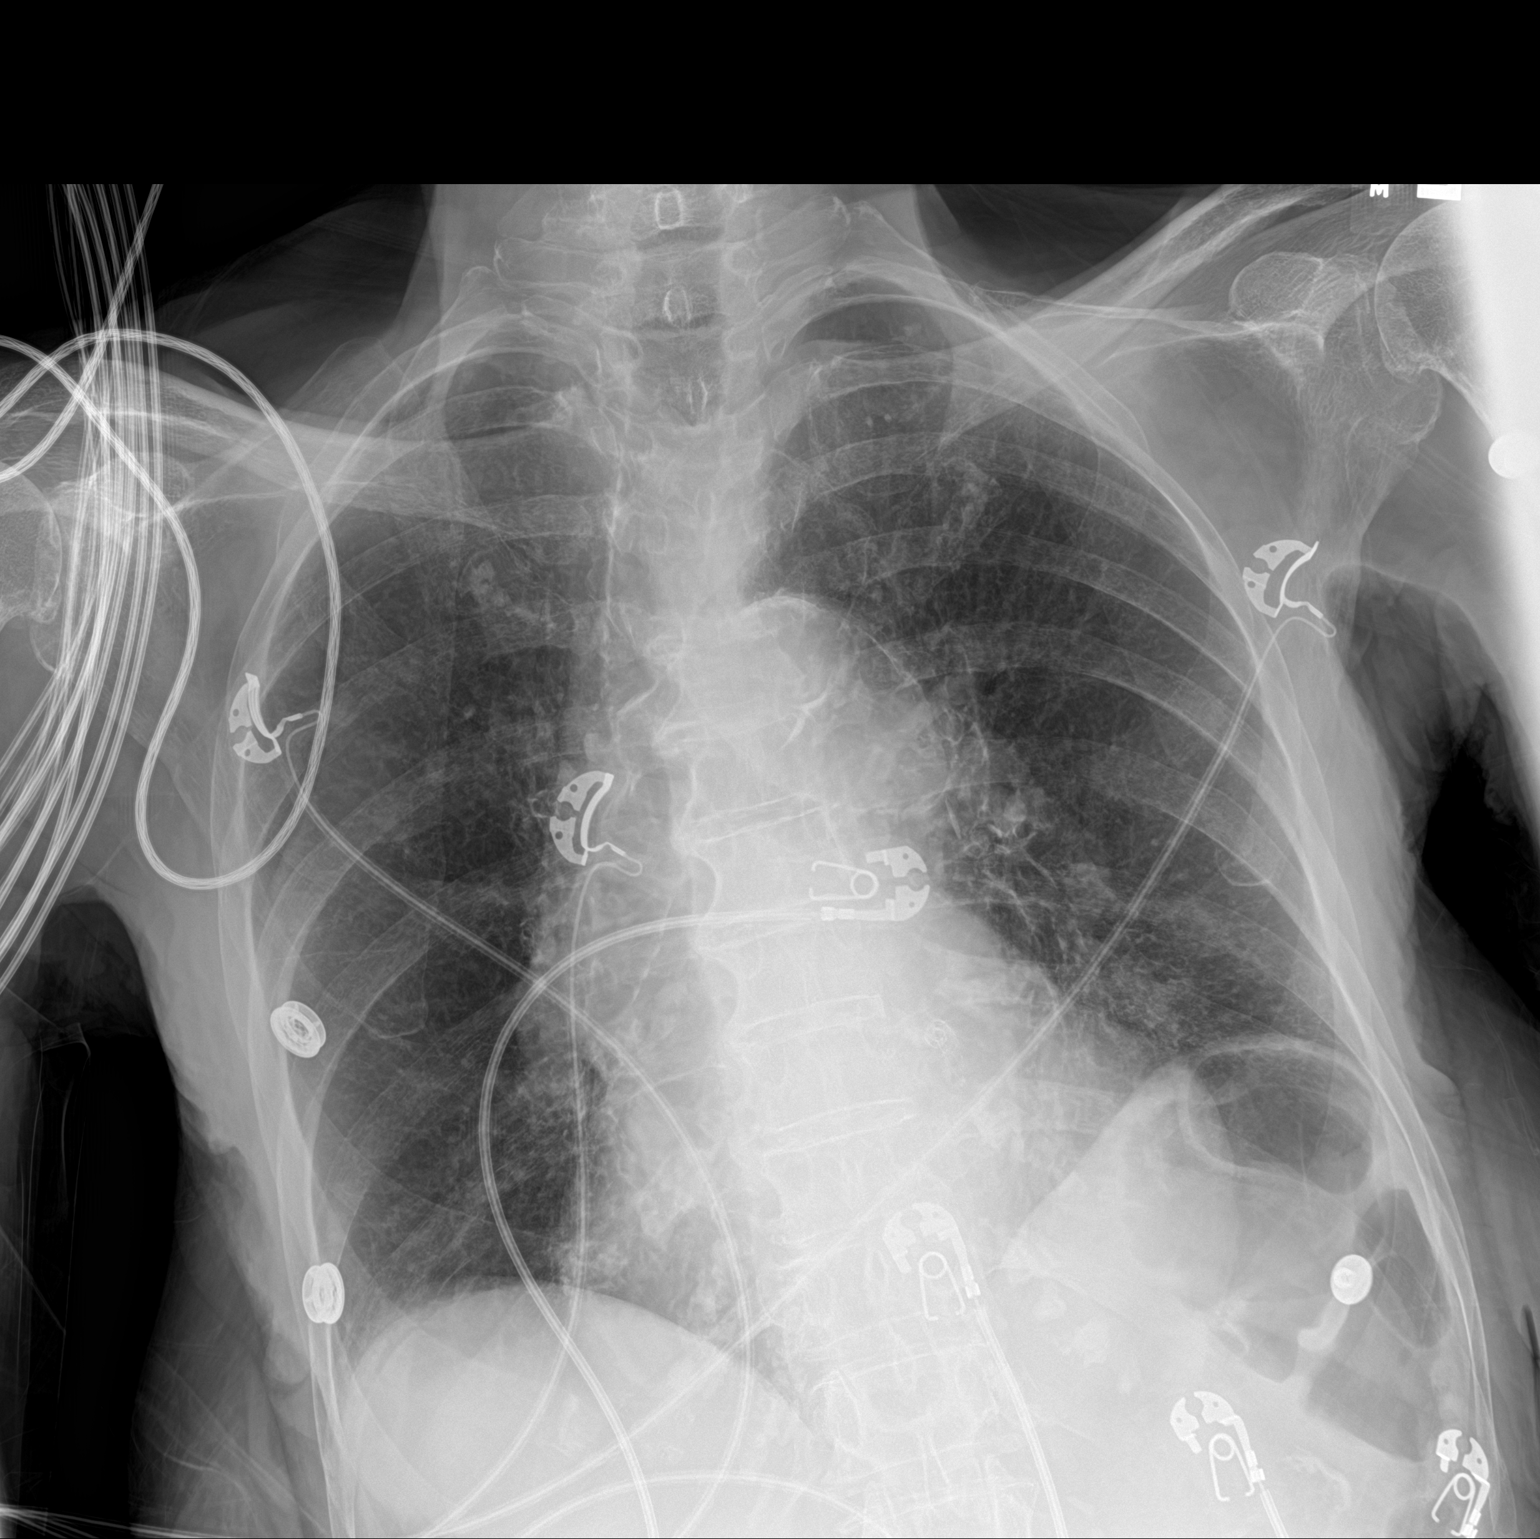

[1 of 1 positions shown; findings below may reference images not displayed]

FINDINGS: Unchanged cardiomediastinal contours. Mild cardiomegaly. Aortic
atherosclerosis. No appreciable airspace consolidation or pulmonary
edema. No evidence of pleural effusion or pneumothorax. No acute
bony abnormality identified.
IMPRESSION: No evidence of acute cardiopulmonary abnormality.

Mild cardiomegaly, unchanged.

Aortic Atherosclerosis ([id number]).

## 2020-07-25 MED ORDER — ERYTHROMYCIN 5 MG/GM OP OINT
1.0000 "application " | TOPICAL_OINTMENT | Freq: Four times a day (QID) | OPHTHALMIC | Status: DC
  Administered 2020-07-25 – 2020-07-28 (×12): 1 via OPHTHALMIC
  Filled 2020-07-25 (×2): qty 3.5

## 2020-07-25 MED ORDER — ENOXAPARIN SODIUM 30 MG/0.3ML ~~LOC~~ SOLN
30.0000 mg | SUBCUTANEOUS | Status: DC
  Administered 2020-07-25 – 2020-08-06 (×13): 30 mg via SUBCUTANEOUS
  Filled 2020-07-25 (×12): qty 0.3

## 2020-07-25 MED ORDER — AMLODIPINE BESYLATE 5 MG PO TABS
5.0000 mg | ORAL_TABLET | Freq: Every day | ORAL | Status: DC
  Administered 2020-07-25 – 2020-08-06 (×13): 5 mg via ORAL
  Filled 2020-07-25 (×14): qty 1

## 2020-07-25 MED ORDER — INSULIN ASPART 100 UNIT/ML IV SOLN
10.0000 [IU] | Freq: Once | INTRAVENOUS | Status: AC
  Administered 2020-07-25: 10 [IU] via INTRAVENOUS

## 2020-07-25 MED ORDER — LACTATED RINGERS IV BOLUS
500.0000 mL | Freq: Once | INTRAVENOUS | Status: AC
  Administered 2020-07-25: 500 mL via INTRAVENOUS

## 2020-07-25 MED ORDER — METOPROLOL TARTRATE 25 MG PO TABS
25.0000 mg | ORAL_TABLET | Freq: Two times a day (BID) | ORAL | Status: DC
  Administered 2020-07-25 – 2020-08-06 (×26): 25 mg via ORAL
  Filled 2020-07-25 (×26): qty 1

## 2020-07-25 MED ORDER — ACETAMINOPHEN 500 MG PO TABS
1000.0000 mg | ORAL_TABLET | Freq: Four times a day (QID) | ORAL | Status: DC | PRN
  Administered 2020-07-30 – 2020-08-05 (×10): 1000 mg via ORAL
  Filled 2020-07-25 (×10): qty 2

## 2020-07-25 MED ORDER — DEXTROSE 50 % IV SOLN
1.0000 | Freq: Once | INTRAVENOUS | Status: AC
  Administered 2020-07-25: 50 mL via INTRAVENOUS
  Filled 2020-07-25: qty 50

## 2020-07-25 MED ORDER — ADULT MULTIVITAMIN W/MINERALS CH
1.0000 | ORAL_TABLET | Freq: Every day | ORAL | Status: DC
  Administered 2020-07-26 – 2020-08-06 (×12): 1 via ORAL
  Filled 2020-07-25 (×13): qty 1

## 2020-07-25 MED ORDER — ATORVASTATIN CALCIUM 10 MG PO TABS
10.0000 mg | ORAL_TABLET | Freq: Every day | ORAL | Status: DC
  Administered 2020-07-25 – 2020-08-06 (×13): 10 mg via ORAL
  Filled 2020-07-25 (×14): qty 1

## 2020-07-25 MED ORDER — CLOPIDOGREL BISULFATE 75 MG PO TABS
75.0000 mg | ORAL_TABLET | Freq: Every day | ORAL | Status: DC
  Administered 2020-07-25 – 2020-08-06 (×13): 75 mg via ORAL
  Filled 2020-07-25 (×14): qty 1

## 2020-07-25 MED ORDER — ONDANSETRON HCL 4 MG/2ML IJ SOLN: 4.0000 mg | Freq: Four times a day (QID) | INTRAMUSCULAR | Status: DC | PRN

## 2020-07-25 MED ORDER — ONDANSETRON HCL 4 MG PO TABS
4.0000 mg | ORAL_TABLET | Freq: Four times a day (QID) | ORAL | Status: DC | PRN
  Administered 2020-08-06: 4 mg via ORAL
  Filled 2020-07-25: qty 1

## 2020-07-25 MED ORDER — MELATONIN 3 MG PO TABS
3.0000 mg | ORAL_TABLET | Freq: Every day | ORAL | Status: DC
  Administered 2020-07-25 – 2020-08-06 (×13): 3 mg via ORAL
  Filled 2020-07-25 (×13): qty 1

## 2020-07-25 MED ORDER — BISACODYL 10 MG RE SUPP
10.0000 mg | Freq: Every day | RECTAL | Status: DC | PRN
  Administered 2020-07-27 – 2020-08-04 (×2): 10 mg via RECTAL
  Filled 2020-07-25 (×2): qty 1

## 2020-07-25 MED ORDER — SODIUM CHLORIDE 0.9 % IV SOLN: INTRAVENOUS | Status: DC

## 2020-07-25 MED ORDER — VALSARTAN-HYDROCHLOROTHIAZIDE 160-25 MG PO TABS: 1.0000 | ORAL_TABLET | Freq: Every day | ORAL | Status: DC

## 2020-07-25 NOTE — H&P (Signed)
History and Physical    Bonnie Morton UVO:[id number] DOB: Jan 24, 1928 DOA: 07/25/2020  PCP: Andi Devon, MD   Patient coming from: Home  I have personally briefly reviewed patient's old medical records in [facility]  Chief Complaint: Weakness  HPI: Bonnie Morton is a 86 y.o. female with medical history significant for coronary artery disease status post stent angioplasty, hypertension, dyslipidemia, osteoporosis and GERD who was brought into the ER for evaluation of generalized weakness.  Patient states that she has had weakness which has progressively worsened over the last several days associated with inability to get around which she was able to do until the last couple of days.  She states that she fell about 4 weeks ago and since then has had a progressive decline.  She complains of feeling dizzy and lightheaded and states that her appetite has been poor but denies having any abdominal pain, no nausea, no vomiting or any changes in her bowel habits.  She denies having any fever or chills.  Denies having any urinary symptoms, no headache or shortness of breath. She lives with 2 of her younger sisters and states that they have tried to help each other with their ADLs but are beginning to have difficulty with doing that. Labs show sodium 136, potassium 5.7, chloride 102, bicarb 20, glucose 143, BUN 58, creatinine 1.15, calcium 10.8, alkaline phosphatase 74, albumin 3.4, lipase 38, AST 54, ALT 18 total protein 7.6, total bilirubin 1.9, troponin 74, white count 8.7, hemoglobin 11.1, hematocrit 35.9, MCV 99.7, RDW 13.0, platelet count 284 Urinalysis is sterile Chest x-ray reviewed by me shows no evidence of acute cardiopulmonary abnormality.  Mild cardiomegaly. Twelve-lead EKG shows sinus rhythm, PVCs and LVH.   ED Course: Patient is a 86 year old African-American female who presents to the ER for evaluation of generalized weakness and is found to be dehydrated.  Labs also revealed  mild hyperkalemia.  She will be admitted to the hospital for hydration and physical therapy evaluation for disposition.  Review of Systems: As per HPI otherwise 10 point review of systems negative.    Past Medical History:  Diagnosis Date  . Antral ulcer    chronic atrophic gastritis with intestinal metaplasia and surface erosion  . CAD (coronary artery disease)    Pt. develpoed chest burning w/raiation to her L arm /exertion in 08. She went to ER, found to have NSTEMI, LHC showed long 75-80% proximal to mid LAD stenosis w/99% ostial D2 stenosis and diffuse mild to moderate RCA disease. She had 2 Cypher stents to the proximal to mid LAD. Echo (5/11): EF 60-65%, grade 1 diastolic dysfunction, normal wall motion, no significant valvular abnormalities.   . Glaucoma   . H/O: hysterectomy   . HTN (hypertension)   . Hyperlipidemia   . Iron deficiency anemia    with normal colonoscopy last year per her report.   . Osteoarthritis of hip   . S/P subtotal thyroidectomy     Past Surgical History:  Procedure Laterality Date  . ABDOMINAL HYSTERECTOMY    . ESOPHAGOGASTRODUODENOSCOPY  08/03/2011   Procedure: ESOPHAGOGASTRODUODENOSCOPY (EGD);  Surgeon: Hart Carwin, MD;  Location: [facility];  Service: Endoscopy;  Laterality: N/A;  . EYE SURGERY     R eye cataract surgery  . LEFT HEART CATH AND CORONARY ANGIOGRAPHY N/A 12/25/2017   Procedure: LEFT HEART CATH AND CORONARY ANGIOGRAPHY;  Surgeon: Tonny Bollman, MD;  Location: [facility] INVASIVE CV LAB;  Service: Cardiovascular;  Laterality: N/A;  . THYROIDECTOMY  subtotal     reports that she has never smoked. She has never used smokeless tobacco. She reports that she does not drink alcohol. No history on file for drug use.  No Known Allergies  Family History  Problem Relation Age of Onset  . Hypertension Mother   . Lung cancer Father      Prior to Admission medications   Medication Sig Start Date End Date Taking? Authorizing Provider   acetaminophen (TYLENOL) 500 MG tablet Take 1,000 mg by mouth as needed for mild pain or headache.   Yes [provider]  amLODipine (NORVASC) 5 MG tablet Take 5 mg by mouth daily. 01/22/20  Yes [provider]  atorvastatin (LIPITOR) 10 MG tablet Take 10 mg by mouth daily. 10/26/17  Yes [provider]  bisacodyl (DULCOLAX) 10 MG suppository Place 1 suppository (10 mg total) rectally daily as needed for moderate constipation or severe constipation. 04/07/20  Yes Joseph Art, DO  clopidogrel (PLAVIX) 75 MG tablet Take 1 tablet (75 mg total) by mouth daily. 12/27/17  Yes Alison Murray, MD  metoprolol tartrate (LOPRESSOR) 25 MG tablet Take 1 tablet (25 mg total) by mouth 2 (two) times daily. 04/07/20  Yes Joseph Art, DO  Multiple Vitamins-Minerals (CENTRUM SILVER ADULT 50+) TABS Take 1 tablet by mouth daily at 12 noon.   Yes [provider]  valsartan-hydrochlorothiazide (DIOVAN-HCT) 160-25 MG tablet Take 1 tablet by mouth daily. 01/22/20  Yes [provider]  diclofenac Sodium (VOLTAREN) 1 % GEL Apply 2 g topically 4 (four) times daily. Patient not taking: Reported on 07/25/2020 04/07/20   Joseph Art, DO    Physical Exam: Vitals:   07/25/20 1345 07/25/20 1400 07/25/20 1415 07/25/20 1430  BP: (!) 159/93 (!) 144/101 (!) 160/63 (!) 159/74  Pulse: 66 63 63 60  Resp: 18 15 16 10   Temp:      TempSrc:      SpO2: 96% 97% 97% 100%  Weight:      Height:         Vitals:   07/25/20 1345 07/25/20 1400 07/25/20 1415 07/25/20 1430  BP: (!) 159/93 (!) 144/101 (!) 160/63 (!) 159/74  Pulse: 66 63 63 60  Resp: 18 15 16 10   Temp:      TempSrc:      SpO2: 96% 97% 97% 100%  Weight:      Height:        Constitutional: NAD, alert and oriented x 3.  Frail Eyes: PERRL, lids and conjunctivae pallor ENMT: Mucous membranes are moist.  Neck: normal, supple, no masses, no thyromegaly Respiratory: clear to auscultation bilaterally, no wheezing, no  crackles. Normal respiratory effort. No accessory muscle use.  Cardiovascular: Regular rate and rhythm, no murmurs / rubs / gallops. No extremity edema. 2+ pedal pulses. No carotid bruits.  Abdomen: Epigastric tenderness, no masses palpated. No hepatosplenomegaly. Bowel sounds positive.  Musculoskeletal: no clubbing / cyanosis.  Bilateral knee swelling  Skin: no rashes, lesions, ulcers.  Neurologic: No gross focal neurologic deficit.  Generalized weakness Psychiatric: Normal mood and affect.   Labs on Admission: I have personally reviewed following labs and imaging studies  CBC: Recent Labs  Lab 07/25/20 1058  WBC 8.7  HGB 11.1*  HCT 35.9*  MCV 99.7  PLT 284   Basic Metabolic Panel: Recent Labs  Lab 07/25/20 1058  NA 136  K 5.7*  CL 102  CO2 20*  GLUCOSE 143*  BUN 58*  CREATININE 1.15*  CALCIUM  10.8*   GFR: Estimated Creatinine Clearance: 23 mL/min (A) (by C-G formula based on SCr of 1.15 mg/dL (H)). Liver Function Tests: Recent Labs  Lab 07/25/20 1058  AST 54*  ALT 18  ALKPHOS 74  BILITOT 1.9*  PROT 7.6  ALBUMIN 3.4*   Recent Labs  Lab 07/25/20 1058  LIPASE 38   No results for input(s): AMMONIA in the last 168 hours. Coagulation Profile: No results for input(s): INR, PROTIME in the last 168 hours. Cardiac Enzymes: No results for input(s): CKTOTAL, CKMB, CKMBINDEX, TROPONINI in the last 168 hours. BNP (last 3 results) No results for input(s): PROBNP in the last 8760 hours. HbA1C: No results for input(s): HGBA1C in the last 72 hours. CBG: No results for input(s): GLUCAP in the last 168 hours. Lipid Profile: No results for input(s): CHOL, HDL, LDLCALC, TRIG, CHOLHDL, LDLDIRECT in the last 72 hours. Thyroid Function Tests: No results for input(s): TSH, T4TOTAL, FREET4, T3FREE, THYROIDAB in the last 72 hours. Anemia Panel: No results for input(s): VITAMINB12, FOLATE, FERRITIN, TIBC, IRON, RETICCTPCT in the last 72 hours. Urine analysis:    Component  Value Date/Time   COLORURINE YELLOW 07/25/2020 1425   APPEARANCEUR CLEAR 07/25/2020 1425   LABSPEC 1.015 07/25/2020 1425   PHURINE 5.0 07/25/2020 1425   GLUCOSEU NEGATIVE 07/25/2020 1425   HGBUR NEGATIVE 07/25/2020 1425   BILIRUBINUR NEGATIVE 07/25/2020 1425   KETONESUR 5 (A) 07/25/2020 1425   PROTEINUR 30 (A) 07/25/2020 1425   UROBILINOGEN 0.2 08/02/2011 1420   NITRITE NEGATIVE 07/25/2020 1425   LEUKOCYTESUR NEGATIVE 07/25/2020 1425    Radiological Exams on Admission: DG Chest Port 1 View  Result Date: 07/25/2020 CLINICAL DATA:  Generalized weakness. Additional provided: Dizziness and nausea since yesterday afternoon. EXAM: PORTABLE CHEST 1 VIEW COMPARISON:  Prior chest radiographs 04/04/2020 and earlier. FINDINGS: Unchanged cardiomediastinal contours. Mild cardiomegaly. Aortic atherosclerosis. No appreciable airspace consolidation or pulmonary edema. No evidence of pleural effusion or pneumothorax. No acute bony abnormality identified. IMPRESSION: No evidence of acute cardiopulmonary abnormality. Mild cardiomegaly, unchanged. Aortic Atherosclerosis (ICD10-I70.0). Electronically Signed   By: Jackey Loge DO   On: 07/25/2020 12:52    EKG: Independently reviewed.  Sinus rhythm with PACs LVH Assessment/Plan Principal Problem:   Generalized weakness Active Problems:   Essential hypertension   CAD (coronary artery disease)   Dehydration   Hyperkalemia     Generalized weakness With inability to ambulate and increased risk for falls We will place patient on fall precautions Obtain PT evaluation   Dehydration As evidenced by elevated BUN of 58 and slight bump in her creatinine level above her baseline Secondary to poor oral intake Gentle IV fluid hydration Hold hydrochlorothiazide   Hyperkalemia Most likely medication induced Hold losartan for now We will treat with dextrose and insulin   Hypertension Continue metoprolol and amlodipine   History of coronary artery  disease Status post stent angioplasty Continue Plavix, metoprolol and statins   Elevated troponin Unclear etiology Patient denies having any chest pain or shortness of breath She has a slight bump in her troponin We will cycle cardiac enzymes Obtain total CK levels   DVT prophylaxis: Lovenox Code Status: DO NOT RESUSCITATE Family Communication: Greater than 50% of time was spent discussing plan of care with patient at the bedside.  All questions and concerns have been addressed.  She verbalizes understanding and agrees with the plan.  CODE STATUS was discussed and she is a DO NOT RESUSCITATE Disposition Plan: Back to previous home environment Consults called: Physical therapy  Lucile Shuttersochukwu Octivia Canion MD [facility]     07/25/2020, 3:23 PM

## 2020-07-25 NOTE — ED Triage Notes (Signed)
Pt here ptar co dizziness and nausea since yesterday afternoon. Hasnt ate since yesterday at noon. 130/80 78 97% 18 cbg 180 99.1 with ptar. A&0x4. Denies pain.

## 2020-07-25 NOTE — ED Provider Notes (Addendum)
[facility] EMERGENCY DEPARTMENT Provider Note   CSN: [id number] Arrival date & time: 07/25/20  1051     History Chief Complaint  Patient presents with  . Dizziness  . Nausea    Bonnie Morton is a 86 y.o. female.  HPI Patient reports she has been getting extremely weak over the past several weeks.  She lives independently with 2 of her sisters.  She reports that they assist each other and one of the sisters also has a daughter who lives very nearby and assists them as well.  Patient reports that she has been taking in oral fluids but she is had extreme lack of appetite.  She reports is gotten very difficult for her to transition into her wheelchair and move about the house.  She reports previously she had been using a walker and taking care of herself.  She has gotten increasingly weak she started using a wheelchair.  She reports now, if she even tries to sit in the wheelchair she will often just slide out of bed onto the floor and then have to lie on the floor or drag herself about to try to get back up again.  She reports when she is upright she feels dizzy and that she will pass out.  He denies she has had vomiting or diarrhea.  She denies pain or burning with urination.  She is not experiencing any headache, chest pain or shortness of breath.  He does not feel that one arm or leg is weak, she describes weakness is very generalized and worsened by any attempts at exertion.    Past Medical History:  Diagnosis Date  . Antral ulcer    chronic atrophic gastritis with intestinal metaplasia and surface erosion  . CAD (coronary artery disease)    Pt. develpoed chest burning w/raiation to her L arm /exertion in 08. She went to ER, found to have NSTEMI, LHC showed long 75-80% proximal to mid LAD stenosis w/99% ostial D2 stenosis and diffuse mild to moderate RCA disease. She had 2 Cypher stents to the proximal to mid LAD. Echo (5/11): EF 60-65%, grade 1 diastolic dysfunction,  normal wall motion, no significant valvular abnormalities.   . Glaucoma   . H/O: hysterectomy   . HTN (hypertension)   . Hyperlipidemia   . Iron deficiency anemia    with normal colonoscopy last year per her report.   . Osteoarthritis of hip   . S/P subtotal thyroidectomy     Patient Active Problem List   Diagnosis Date Noted  . Knee effusion, right 04/05/2020  . Near syncope 06/28/2019  . Dehydration 06/28/2019  . Hypotension 06/28/2019  . CAD (coronary artery disease)   . Chest pain 12/25/2017  . Non-ST elevation (NSTEMI) myocardial infarction (HCC)   . Edema of lower extremity 08/05/2011  . Ulcer, peptic, acute or chronic 08/03/2011  . Anemia 08/01/2011  . Constipation 08/01/2011  . Hyperlipidemia 03/30/2011  . Essential hypertension 01/10/2010  . CORONARY ATHEROSCLEROSIS NATIVE CORONARY ARTERY 01/10/2010    Past Surgical History:  Procedure Laterality Date  . ABDOMINAL HYSTERECTOMY    . ESOPHAGOGASTRODUODENOSCOPY  08/03/2011   Procedure: ESOPHAGOGASTRODUODENOSCOPY (EGD);  Surgeon: Hart Carwin, MD;  Location: [facility];  Service: Endoscopy;  Laterality: N/A;  . EYE SURGERY     R eye cataract surgery  . LEFT HEART CATH AND CORONARY ANGIOGRAPHY N/A 12/25/2017   Procedure: LEFT HEART CATH AND CORONARY ANGIOGRAPHY;  Surgeon: Tonny Bollman, MD;  Location: [facility] INVASIVE CV LAB;  Service: Cardiovascular;  Laterality: N/A;  . THYROIDECTOMY     subtotal     OB History   No obstetric history on file.     Family History  Problem Relation Age of Onset  . Hypertension Mother   . Lung cancer Father     Social History   Tobacco Use  . Smoking status: Never Smoker  . Smokeless tobacco: Never Used  Substance Use Topics  . Alcohol use: No  . Drug use: Not on file    Home Medications Prior to Admission medications   Medication Sig Start Date End Date Taking? Authorizing Provider  acetaminophen (TYLENOL) 500 MG tablet Take 1,000 mg by mouth as needed for mild pain  or headache.   Yes [provider]  amLODipine (NORVASC) 5 MG tablet Take 5 mg by mouth daily. 01/22/20  Yes [provider]  atorvastatin (LIPITOR) 10 MG tablet Take 10 mg by mouth daily. 10/26/17  Yes [provider]  bisacodyl (DULCOLAX) 10 MG suppository Place 1 suppository (10 mg total) rectally daily as needed for moderate constipation or severe constipation. 04/07/20  Yes Joseph Art, DO  clopidogrel (PLAVIX) 75 MG tablet Take 1 tablet (75 mg total) by mouth daily. 12/27/17  Yes Alison Murray, MD  metoprolol tartrate (LOPRESSOR) 25 MG tablet Take 1 tablet (25 mg total) by mouth 2 (two) times daily. 04/07/20  Yes Joseph Art, DO  Multiple Vitamins-Minerals (CENTRUM SILVER ADULT 50+) TABS Take 1 tablet by mouth daily at 12 noon.   Yes [provider]  valsartan-hydrochlorothiazide (DIOVAN-HCT) 160-25 MG tablet Take 1 tablet by mouth daily. 01/22/20  Yes [provider]  diclofenac Sodium (VOLTAREN) 1 % GEL Apply 2 g topically 4 (four) times daily. Patient not taking: Reported on 07/25/2020 04/07/20   Joseph Art, DO    Allergies    Patient has no known allergies.  Review of Systems   Review of Systems 10 systems reviewed and negative except as per HPI Physical Exam Updated Vital Signs BP (!) 159/74   Pulse 60   Temp 98 F (36.7 C) (Oral)   Resp 10   Ht 5\' 3"  (1.6 m)   Wt 44.9 kg   SpO2 100%   BMI 17.54 kg/m   Physical Exam Constitutional:      Comments: Patient is alert with clear mental status and normal speech.  No respiratory distress at rest.  Very thin but excellent condition for age.  Slightly pale in appearance.  HENT:     Head: Normocephalic and atraumatic.     Mouth/Throat:     Pharynx: Oropharynx is clear.  Eyes:     Extraocular Movements: Extraocular movements intact.     Pupils: Pupils are equal, round, and reactive to light.  Cardiovascular:     Rate and Rhythm: Normal rate and regular rhythm.  Pulmonary:      Effort: Pulmonary effort is normal.     Breath sounds: Normal breath sounds.  Abdominal:     General: There is no distension.     Palpations: Abdomen is soft.     Tenderness: There is no abdominal tenderness. There is no guarding.  Musculoskeletal:     Comments: No peripheral edema.  Patient does have significant muscular atrophy of the lower legs.  Feet and lower legs are very dry and scaling.  No appearance of active wounds or cellulitis.  Skin:    General: Skin is warm and dry.     Coloration: Skin is  pale.  Neurological:     Comments: Patient is alert.  Mental status clear.  Speech clear.  Content normal.  She can assist in trying to move in the stretcher but is weak to pulling herself straight upright.  She can assist by reaching across her body to each handrail and slightly raising her back.  She can move both lower extremities at command but cannot hold against resistance.  Psychiatric:        Mood and Affect: Mood normal.     ED Results / Procedures / Treatments   Labs (all labs ordered are listed, but only abnormal results are displayed) Labs Reviewed  BASIC METABOLIC PANEL - Abnormal; Notable for the following components:      Result Value   Potassium 5.7 (*)    CO2 20 (*)    Glucose, Bld 143 (*)    BUN 58 (*)    Creatinine, Ser 1.15 (*)    Calcium 10.8 (*)    GFR, Estimated 45 (*)    All other components within normal limits  CBC - Abnormal; Notable for the following components:   RBC 3.60 (*)    Hemoglobin 11.1 (*)    HCT 35.9 (*)    All other components within normal limits  HEPATIC FUNCTION PANEL - Abnormal; Notable for the following components:   Albumin 3.4 (*)    AST 54 (*)    Total Bilirubin 1.9 (*)    Bilirubin, Direct 0.6 (*)    Indirect Bilirubin 1.3 (*)    All other components within normal limits  TROPONIN I (HIGH SENSITIVITY) - Abnormal; Notable for the following components:   Troponin I (High Sensitivity) 74 (*)    All other components  within normal limits  EYE CULTURE  LIPASE, BLOOD  URINALYSIS, ROUTINE W REFLEX MICROSCOPIC  TROPONIN I (HIGH SENSITIVITY)    EKG EKG Interpretation  Date/Time:  Wednesday July 25 2020 11:11:09 EST Ventricular Rate:  68 PR Interval:    QRS Duration: 109 QT Interval:  383 QTC Calculation: 408 R Axis:   -54 Text Interpretation: Ectopic atrial rhythm Multiple premature complexes, vent & supraven Probable left ventricular hypertrophy inferior PVC, grossly similar to previous, neeed repeat for artifact Confirmed by Arby Barrette [phone]) on 07/25/2020 11:40:34 AM   Radiology DG Chest Port 1 View  Result Date: 07/25/2020 CLINICAL DATA:  Generalized weakness. Additional provided: Dizziness and nausea since yesterday afternoon. EXAM: PORTABLE CHEST 1 VIEW COMPARISON:  Prior chest radiographs 04/04/2020 and earlier. FINDINGS: Unchanged cardiomediastinal contours. Mild cardiomegaly. Aortic atherosclerosis. No appreciable airspace consolidation or pulmonary edema. No evidence of pleural effusion or pneumothorax. No acute bony abnormality identified. IMPRESSION: No evidence of acute cardiopulmonary abnormality. Mild cardiomegaly, unchanged. Aortic Atherosclerosis (ICD10-I70.0). Electronically Signed   By: Jackey Loge DO   On: 07/25/2020 12:52    Procedures Procedures (including critical care time)  Medications Ordered in ED Medications  erythromycin ophthalmic ointment 1 application (1 application Right Eye Given 07/25/20 1430)  lactated ringers bolus 500 mL (500 mLs Intravenous New Bag/Given 07/25/20 1430)    ED Course  I have reviewed the triage vital signs and the nursing notes.  Pertinent labs & imaging results that were available during my care of the patient were reviewed by me and considered in my medical decision making (see chart for details).  Clinical Course as of Jul 25 1432  Wed Jul 25, 2020  1432 Consult: Hospitalist for admission.   [MP]    Clinical Course User  Index [MP]  Arby BarrettePfeiffer, Tiffeny Minchew, MD   MDM Rules/Calculators/A&P                          Patient presents as outlined.  Patient has experienced worsening generalized weakness with increasingly frequent falls and loss of mobility.  Labs suggest mild dehydration with AKI.  Mild hyperkalemia without EKG changes.  Patient does have PVCs on EKG but other QRS morphology consistent with old tracing.  Patient reports poor oral intake.  At this time, hydration initiated with lactated Ringer's 500 cc bolus.  At last admission, patient had declined SNF placement.  At this time after hydration, patient will likely need PT OT assessment for ongoing independent living with in-home assistance. Final Clinical Impression(s) / ED Diagnoses Final diagnoses:  Generalized weakness  Dehydration  Loss of appetite for more than 2 weeks  Advanced age    Rx / DC Orders ED Discharge Orders    None       Arby BarrettePfeiffer, Masoud Nyce, MD 07/25/20 1426    Arby BarrettePfeiffer, Kimm Sider, MD 07/25/20 1433

## 2020-07-25 NOTE — ED Notes (Signed)
Attempted to give reportx1 

## 2020-07-25 NOTE — Progress Notes (Signed)
Pt arrived on the floor via bed. Pt denies pain and has all belongings at the bedside and tray delivered. Pt is connected to Tele and all IV are intact. Telephone and call light are within reach.   07/25/20 1803  Vitals  Temp 97.9 F (36.6 C)  Temp Source Oral  BP (!) 157/78  MAP (mmHg) 101  BP Location Right Arm  BP Method Automatic  Patient Position (if appropriate) Lying  Pulse Rate 71  Pulse Rate Source Dinamap  Resp 20  Level of Consciousness  Level of Consciousness Alert  MEWS COLOR  MEWS Score Color Green  Oxygen Therapy  SpO2 99 %  O2 Device Room Air  Pain Assessment  Pain Scale 0-10  Pain Score 0  MEWS Score  MEWS Temp 0  MEWS Systolic 0  MEWS Pulse 0  MEWS RR 0  MEWS LOC 0  MEWS Score 0

## 2020-07-26 DIAGNOSIS — R531 Weakness: Secondary | ICD-10-CM | POA: Diagnosis not present

## 2020-07-26 LAB — CBC
HCT: 32.3 % — ABNORMAL LOW (ref 36.0–46.0)
Hemoglobin: 10.6 g/dL — ABNORMAL LOW (ref 12.0–15.0)
MCH: 31.8 pg (ref 26.0–34.0)
MCHC: 32.8 g/dL (ref 30.0–36.0)
MCV: 97 fL (ref 80.0–100.0)
Platelets: 263 10*3/uL (ref 150–400)
RBC: 3.33 MIL/uL — ABNORMAL LOW (ref 3.87–5.11)
RDW: 13 % (ref 11.5–15.5)
WBC: 8.4 10*3/uL (ref 4.0–10.5)
nRBC: 0 % (ref 0.0–0.2)

## 2020-07-26 LAB — BASIC METABOLIC PANEL
Anion gap: 12 (ref 5–15)
BUN: 41 mg/dL — ABNORMAL HIGH (ref 8–23)
CO2: 21 mmol/L — ABNORMAL LOW (ref 22–32)
Calcium: 10.4 mg/dL — ABNORMAL HIGH (ref 8.9–10.3)
Chloride: 105 mmol/L (ref 98–111)
Creatinine, Ser: 0.91 mg/dL (ref 0.44–1.00)
GFR, Estimated: 60 mL/min — ABNORMAL LOW (ref >60)
Glucose, Bld: 122 mg/dL — ABNORMAL HIGH (ref 70–99)
Potassium: 4.4 mmol/L (ref 3.5–5.1)
Sodium: 138 mmol/L (ref 135–145)

## 2020-07-26 NOTE — Evaluation (Signed)
Physical Therapy Evaluation Patient Details Name: Bonnie Morton MRN: [id number] DOB: 06-24-1928 Today's Date: 07/26/2020   History of Present Illness  Pt is 86 yo female who presented to ED with generalized weakness. Reports progressive weakness and difficulty with mobility s/p fall 4 weeks ago with dizziness, lightheadedness, and poor appetite. Pt with PMH including coronary artery disease status post stent angioplasty, hypertension, dyslipidemia, osteoporosis and GERD.  Clinical Impression   Pt admitted with above diagnosis. Pt presenting with significant weakness, decreased mobility, and high fall risk.  She required mod-max A for transfers and would benefit form +2 to advance due to posterior lean and fear of falling.  Pt was poor historian but per chart declined mobility over past 4 weeks with difficulty managing at home with her sisters.  Additionally, pt reports "room spinning" with sitting and head turns in the past but did not have issues today.  Pt would likely benefit from further vestibular testing, but unable to perform today due to dinner arriving and pt wanting to eat (did not want to make pt dizzy/nauseous with vestibular testing).  Did check orthostatic BP and they were negative, but question accuracy of standing reading as pt's arm was very tense. Pt currently with functional limitations due to the deficits listed below (see PT Problem List). Pt will benefit from skilled PT to increase their independence and safety with mobility to allow discharge to the venue listed below.       Follow Up Recommendations SNF    Equipment Recommendations  Wheelchair cushion (measurements PT);Wheelchair (measurements PT)    Recommendations for Other Services       Precautions / Restrictions Precautions Precautions: Fall      Mobility  Bed Mobility Overal bed mobility: Needs Assistance Bed Mobility: Supine to Sit;Sit to Supine     Supine to sit: Mod assist Sit to supine: Mod assist    General bed mobility comments: increased time and cues for sequecing; Mod A for trunk and scooting forward    Transfers Overall transfer level: Needs assistance Equipment used: Rolling walker (2 wheeled) Transfers: Sit to/from Stand Sit to Stand: Mod assist         General transfer comment: 3 Attempts, required significant verbal, visual, and tactile cues with a target to lean forward prior to standing.  Despite this pt still with heavy posterior lean, unable to tuck buttock, and pushing legs posteriorly into bed  Ambulation/Gait             General Gait Details: unable  Stairs            Wheelchair Mobility    Modified Rankin (Stroke Patients Only)       Balance Overall balance assessment: Needs assistance Sitting-balance support: Bilateral upper extremity supported;No upper extremity supported Sitting balance-Leahy Scale: Fair Sitting balance - Comments: Sat EOB : preferred UE support but could maintain without UE support; close supervision; posterior lean tendency     Standing balance-Leahy Scale: Poor Standing balance comment: Requiring mod A with posterior lean and pushing legs back into bed                             Pertinent Vitals/Pain Pain Assessment: No/denies pain    Home Living Family/patient expects to be discharged to:: Private residence Living Arrangements: Other relatives;Other (Comment) (lives with2sisters; niece also assist) Available Help at Discharge: Family;Available 24 hours/day Type of Home: House Home Access: Stairs to enter Entrance Stairs-Rails: Right Entrance Stairs-Number  of Steps: 5 Home Layout: One level Home Equipment: Walker - 2 wheels;Cane - single point;Bedside commode      Prior Function Level of Independence: Needs assistance   Gait / Transfers Assistance Needed: Reports uses RW in home  ADL's / Homemaking Assistance Needed: Pt reports independent with ADLs but just does sponge baths; has assistance  with showers  Comments: Pt is poor historian and unable to provide details or correct timeline.  Per chart she lives with sisters and with decline in mobility over the past 4 weeks since a fall.  Per chart, unable to manage at home with sisters     Hand Dominance        Extremity/Trunk Assessment   Upper Extremity Assessment Upper Extremity Assessment: Generalized weakness    Lower Extremity Assessment Lower Extremity Assessment: LLE deficits/detail;RLE deficits/detail RLE Deficits / Details: Noted edema R knee - pt reports subacute and had fluid drawn off recently.  ROM grossly WFL but did lack ~10 degree knee ext; MMT 4/5 throughout LLE Deficits / Details: ROM WFL; MMT 4/5 throghout    Cervical / Trunk Assessment Cervical / Trunk Assessment: Kyphotic  Communication   Communication: No difficulties  Cognition Arousal/Alertness: Awake/alert Behavior During Therapy: WFL for tasks assessed/performed Overall Cognitive Status: Impaired/Different from baseline Area of Impairment: Orientation;Memory;Following commands;Problem solving                 Orientation Level: Situation   Memory: Decreased short-term memory Following Commands: Follows one step commands consistently     Problem Solving: Difficulty sequencing;Requires verbal cues;Decreased initiation;Slow processing General Comments: Pt oriented x 3 but difficulty providing timeline/circumstance for hospitalization; Pt had difficutly with multistep commands and required multimodal cues for sequencing with transfers.  Pt with high fear of falling.      General Comments   Orthostatic BPs  Supine 117/57  Sitting 110/64     Standing 128/116 but pt tense/fear of falling, pushing back, required mod A balance, likely inaccurate reading as unable to relax arm, no lightheadedness; Unable to maintain for 3 mins  Immediate return to sit 130/59   Pt did not have any c/o dizziness, lightheadedness during session.  She  reports that when she has had in the past was "room spinning" when she sat up and sometimes with head turns.  Performed EOEM and gaze stabilization - both intact, no nystagmus, no symptoms.  Pt's dinner had arrived so unable to further vestibular test.     Exercises     Assessment/Plan    PT Assessment Patient needs continued PT services  PT Problem List Decreased strength;Decreased mobility;Decreased safety awareness;Decreased range of motion;Decreased coordination;Decreased activity tolerance;Decreased cognition;Cardiopulmonary status limiting activity;Decreased balance;Other (comment);Decreased knowledge of use of DME (possible vestibular deficit)       PT Treatment Interventions DME instruction;Therapeutic activities;Cognitive remediation;Gait training;Therapeutic exercise;Patient/family education;Other (comment);Balance training;Functional mobility training;Neuromuscular re-education (further vestibular assessment)    PT Goals (Current goals can be found in the Care Plan section)  Acute Rehab PT Goals Patient Stated Goal: get stronger; walk independently PT Goal Formulation: With patient Time For Goal Achievement: 08/09/20 Potential to Achieve Goals: Good    Frequency Min 2X/week   Barriers to discharge Decreased caregiver support      Co-evaluation               AM-PAC PT "6 Clicks" Mobility  Outcome Measure Help needed turning from your back to your side while in a flat bed without using bedrails?: A Lot Help needed moving from lying  on your back to sitting on the side of a flat bed without using bedrails?: A Lot Help needed moving to and from a bed to a chair (including a wheelchair)?: A Lot Help needed standing up from a chair using your arms (e.g., wheelchair or bedside chair)?: A Lot Help needed to walk in hospital room?: Total Help needed climbing 3-5 steps with a railing? : Total 6 Click Score: 10    End of Session Equipment Utilized During Treatment: Gait  belt Activity Tolerance: Patient tolerated treatment well Patient left: in bed;with call bell/phone within reach;with bed alarm set Nurse Communication: Mobility status PT Visit Diagnosis: Unsteadiness on feet (R26.81);History of falling (Z91.81);Muscle weakness (generalized) (M62.81)    Time: 4967-5916 PT Time Calculation (min) (ACUTE ONLY): 30 min   Charges:   PT Evaluation $PT Eval Moderate Complexity: 1 Mod PT Treatments $Therapeutic Activity: 8-22 mins        Anise Salvo, PT Acute Rehab Services Pager [phone] [facility] [phone]    Rayetta Humphrey 07/26/2020, 5:52 PM

## 2020-07-26 NOTE — Progress Notes (Signed)
PROGRESS NOTE  Bonnie Morton  DOB: 03-29-1928  PCP: Andi Devon, MD HFW:[id number]  DOA: 07/25/2020  LOS: 1 day   Chief Complaint  Patient presents with  . Dizziness  . Nausea   Brief narrative: Bonnie Morton is a 86 y.o. female who was brought to the ED on 07/25/2020 for generalized weakness PMH significant for coronary artery disease status post stent angioplasty, hypertension, dyslipidemia, osteoporosis and GERD Patient complains of progressively worsening generalized weakness and impaired mobility after a fall 4 weeks ago.  Associated with dizziness, lightheadedness, poor appetite. She lives with 2 of her younger sisters and states that they have tried to help each other with their ADLs but are beginning to have difficulty with doing that.  In the ED, patient was hemodynamically stable, clinically looked dehydrated. Labs remarkable for potassium elevated to 5.7, calcium elevated to 10.8. Urinalysis normal.  Chest x-ray normal. Twelve-lead EKG showed sinus rhythm, PVCs and LVH. Admitted to hospitalist service for further evaluation management.  Subjective: Patient was seen and examined this morning.  Pleasant elderly African-American female.  Propped up in bed.  Not in distress.  Mental status seems intact.   Chart reviewed. Bradycardic to 50s Labs with potassium level improved to 4.4, calcium level slightly improved 10.4  Assessment/Plan: Progressive generalized weakness Falls -Presented with progressive generalized weakness after a fall 4 weeks ago.  Also contributed by dehydration and deconditioning.   -PT eval ordered. -Continue gentle IV hydration.  Keep diuretics on hold.  Continue monitor creatinine -Fall precautions. -Obtain orthostatic hypotension because of mention of dizziness on standing up.  Hyperkalemia -Potassium mildly elevated to 5.7 on admission.  Was on losartan and HCTZ at home.   -Currently both on hold.  Given dextrose and insulin in the ED.    -Potassium level normalized.  Continue to monitor.  Recent Labs  Lab 07/25/20 1058 07/26/20 0308  K 5.7* 4.4   Hypertension Continue metoprolol and amlodipine  Elevated troponin CAD s/p stent angioplasty -No active chest symptoms.   -Troponin elevated to 81 and is slightly improving.  Likely related to dehydration. -Continue metoprolol, Plavix and statins Recent Labs    07/25/20 1426 07/25/20 1710 07/25/20 1841  CKTOTAL 475*  --   --   TROPONINIHS 81* 77* 78*   Mobility: PT eval ordered. Code Status:   Code Status: DNR  Nutritional status: Body mass index is 17.54 kg/m.     Diet Order            Diet 2 gram sodium Room service appropriate? No; Fluid consistency: Thin  Diet effective now                 DVT prophylaxis: enoxaparin (LOVENOX) injection 30 mg Start: 07/25/20 1530 SCDs Start: 07/25/20 1514   Antimicrobials:  None Fluid: Normal saline at 50 mill per hour Consultants: None Family Communication:  None at bedside  Status is: Inpatient  Remains inpatient appropriate because: Patient remains weak, dizzy  Dispo: The patient is from: Home              Anticipated d/c is to: Home versus SNF/rehab              Anticipated d/c date is: 2 days              Patient currently is not medically stable to d/c.       Infusions:  . sodium chloride 50 mL/hr at 07/26/20 0420    Scheduled Meds: . amLODipine  5 mg Oral  Daily  . atorvastatin  10 mg Oral Daily  . clopidogrel  75 mg Oral Daily  . enoxaparin (LOVENOX) injection  30 mg Subcutaneous Q24H  . erythromycin  1 application Right Eye Q6H  . melatonin  3 mg Oral QHS  . metoprolol tartrate  25 mg Oral BID  . multivitamin with minerals  1 tablet Oral Q1200    Antimicrobials: Anti-infectives (From admission, onward)   None      PRN meds: acetaminophen, bisacodyl, ondansetron **OR** ondansetron (ZOFRAN) IV   Objective: Vitals:   07/26/20 0426 07/26/20 0759  BP: 139/67 (!) 135/57   Pulse: (!) 54 (!) 50  Resp: 16 15  Temp: 98.5 F (36.9 C) 98.4 F (36.9 C)  SpO2: 100% 100%    Intake/Output Summary (Last 24 hours) at 07/26/2020 1025 Last data filed at 07/26/2020 0420 Gross per 24 hour  Intake 673.88 ml  Output 425 ml  Net 248.88 ml   Filed Weights   07/25/20 1230  Weight: 44.9 kg   Weight change:  Body mass index is 17.54 kg/m.   Physical Exam: General exam: Appears calm and comfortable.  Not in physical distress Skin: No rashes, lesions or ulcers. HEENT: Atraumatic, normocephalic, supple neck, no obvious bleeding Lungs: Clear to auscultation bilaterally CVS: Bradycardic, regular rhythm, no murmur  GI/Abd soft, nontender, nondistended, bowel sound present CNS: Alert, awake, oriented x3 Psychiatry: Mood appropriate Extremities: No pedal edema, no calf tenderness  Data Review: I have personally reviewed the laboratory data and studies available.  Recent Labs  Lab 07/25/20 1058 07/26/20 0308  WBC 8.7 8.4  HGB 11.1* 10.6*  HCT 35.9* 32.3*  MCV 99.7 97.0  PLT 284 263   Recent Labs  Lab 07/25/20 1058 07/26/20 0308  NA 136 138  K 5.7* 4.4  CL 102 105  CO2 20* 21*  GLUCOSE 143* 122*  BUN 58* 41*  CREATININE 1.15* 0.91  CALCIUM 10.8* 10.4*    F/u labs ordered.  Signed, Lorin Glass, MD [facility] 07/26/2020

## 2020-07-27 DIAGNOSIS — R531 Weakness: Secondary | ICD-10-CM | POA: Diagnosis not present

## 2020-07-27 LAB — EYE CULTURE: Special Requests: NORMAL

## 2020-07-27 NOTE — Progress Notes (Signed)
PT Cancellation Note  Patient Details Name: Bonnie Morton MRN: [id number] DOB: 1928-05-03   Cancelled Treatment:    Reason Eval/Treat Not Completed: Patient declined, no reason specified. Pt refuses PT at this time, reporting she is weak, tired, and feels unwell. Pt reports she feels constipated. PT provides education on the need for mobility to promote roper bowel function however the pt continues to refuse PT session at this time.   Arlyss Gandy 07/27/2020, 3:26 PM

## 2020-07-27 NOTE — [place] FL2 (Signed)
[place] MEDICAID FL2 LEVEL OF CARE SCREENING TOOL     IDENTIFICATION  Patient Name: Bonnie Morton Birthdate: Jan 24, 1928 Sex: female Admission Date (Current Location): 07/25/2020  [facility] and [place] Number:  Producer, television/film/video and Address:  [facility], [address], [place], [place] [address]      Provider Number: [id number]  Attending Physician Name and Address:  Lorin Glass, MD  Relative Name and Phone Number:  Madison Hickman, [phone]    Current Level of Care: SNF Recommended Level of Care: Skilled Nursing Facility Prior Approval Number:    Date Approved/Denied:   PASRR Number: [id number]  Discharge Plan: SNF    Current Diagnoses: Patient Active Problem List   Diagnosis Date Noted  . Generalized weakness 07/25/2020  . Hyperkalemia 07/25/2020  . Knee effusion, right 04/05/2020  . Near syncope 06/28/2019  . Dehydration 06/28/2019  . Hypotension 06/28/2019  . CAD (coronary artery disease)   . Chest pain 12/25/2017  . Non-ST elevation (NSTEMI) myocardial infarction (HCC)   . Edema of lower extremity 08/05/2011  . Ulcer, peptic, acute or chronic 08/03/2011  . Anemia 08/01/2011  . Constipation 08/01/2011  . Hyperlipidemia 03/30/2011  . Essential hypertension 01/10/2010  . CORONARY ATHEROSCLEROSIS NATIVE CORONARY ARTERY 01/10/2010    Orientation RESPIRATION BLADDER Height & Weight     Self, Time, Situation, Place  Normal Continent Weight: 99 lb (44.9 kg) Height:  5\' 3"  (160 cm)  BEHAVIORAL SYMPTOMS/MOOD NEUROLOGICAL BOWEL NUTRITION STATUS      Continent Diet (See discharge Summary)  AMBULATORY STATUS COMMUNICATION OF NEEDS Skin   Limited Assist Verbally Normal                       Personal Care Assistance Level of Assistance  Bathing, Feeding, Dressing Bathing Assistance: Limited assistance Feeding assistance: Independent Dressing Assistance: Limited assistance     Functional Limitations Info  Sight,  Hearing, Speech Sight Info: Adequate Hearing Info: Adequate Speech Info: Adequate    SPECIAL CARE FACTORS FREQUENCY  PT (By licensed PT), OT (By licensed OT)     PT Frequency: 5x week OT Frequency: 5x week            Contractures Contractures Info: Not present    Additional Factors Info  Code Status, Allergies Code Status Info: DNR Allergies Info: NKA           Current Medications (07/27/2020):  This is the current hospital active medication list Current Facility-Administered Medications  Medication Dose Route Frequency Provider Last Rate Last Admin  . 0.9 %  sodium chloride infusion   Intravenous Continuous Agbata, Tochukwu, MD 50 mL/hr at 07/27/20 1030 New Bag at 07/27/20 1030  . acetaminophen (TYLENOL) tablet 1,000 mg  1,000 mg Oral Q6H PRN Agbata, Tochukwu, MD      . amLODipine (NORVASC) tablet 5 mg  5 mg Oral Daily Agbata, Tochukwu, MD   5 mg at 07/27/20 1032  . atorvastatin (LIPITOR) tablet 10 mg  10 mg Oral Daily Agbata, Tochukwu, MD   10 mg at 07/27/20 1032  . bisacodyl (DULCOLAX) suppository 10 mg  10 mg Rectal Daily PRN Agbata, Tochukwu, MD      . clopidogrel (PLAVIX) tablet 75 mg  75 mg Oral Daily Agbata, Tochukwu, MD   75 mg at 07/27/20 1032  . enoxaparin (LOVENOX) injection 30 mg  30 mg Subcutaneous Q24H Agbata, Tochukwu, MD   30 mg at 07/26/20 1856  . erythromycin ophthalmic ointment 1 application  1 application Right Eye Q6H Arby Barrette, MD   1 application at 07/27/20 0541  . melatonin tablet 3 mg  3 mg Oral QHS Opyd, Lavone Neri, MD   3 mg at 07/26/20 2210  . metoprolol tartrate (LOPRESSOR) tablet 25 mg  25 mg Oral BID Agbata, Tochukwu, MD   25 mg at 07/27/20 1032  . multivitamin with minerals tablet 1 tablet  1 tablet Oral Q1200 Agbata, Tochukwu, MD   1 tablet at 07/26/20 1856  . ondansetron (ZOFRAN) tablet 4 mg  4 mg Oral Q6H PRN Agbata, Tochukwu, MD       Or  . ondansetron (ZOFRAN) injection 4 mg  4 mg Intravenous Q6H PRN Agbata, Tochukwu, MD          Discharge Medications: Please see discharge summary for a list of discharge medications.  Relevant Imaging Results:  Relevant Lab Results:   Additional Information SS# [id number], [address]

## 2020-07-27 NOTE — Progress Notes (Addendum)
PROGRESS NOTE  Bonnie Morton  DOB: 07-Sep-1928  PCP: Andi Devon, MD HYW:[id number]  DOA: 07/25/2020  LOS: 2 days   Chief Complaint  Patient presents with  . Dizziness  . Nausea   Brief narrative: Bonnie Morton is a 86 y.o. female who was brought to the ED on 07/25/2020 for generalized weakness PMH significant for coronary artery disease status post stent angioplasty, hypertension, dyslipidemia, osteoporosis and GERD Patient complains of progressively worsening generalized weakness and impaired mobility after a fall 4 weeks ago.  Associated with dizziness, lightheadedness, poor appetite. She lives with 2 of her younger sisters and states that they have tried to help each other with their ADLs but are beginning to have difficulty with doing that.  In the ED, patient was hemodynamically stable, clinically looked dehydrated. Labs remarkable for potassium elevated to 5.7, calcium elevated to 10.8. Urinalysis normal.  Chest x-ray normal. Twelve-lead EKG showed sinus rhythm, PVCs and LVH. Admitted to hospitalist service for further evaluation management.  Subjective: Patient was seen and examined this morning.   Propped up in bed.  Not in distress.   No new symptoms.  Per RN, patient had an orthostatic drop in blood pressure this morning.  Assessment/Plan: Progressive generalized weakness Falls Orthostatic hypotension -Patient presented with dizziness, progressive generalized weakness and fall.  Also contributed by dehydration and deconditioning.   -Orthostatic hypotension noted this morning with drop in systolic blood pressure 155 to 131 on standing up. -Continue normal saline at 50 mill per hour.  Keep diuretics on hold.  Continue monitor creatinine -Fall precautions.  Hyperkalemia -Potassium mildly elevated to 5.7 on admission.  Was on losartan and HCTZ at home.   -Currently both on hold.  Given dextrose and insulin in the ED.   -Potassium level normalized.  Continue to  monitor.  Recent Labs  Lab 07/25/20 1058 07/26/20 0308  K 5.7* 4.4   Hypertension Orthostatic hypotension -Continue metoprolol and amlodipine. -Avoid diuretics  Elevated troponin CAD s/p stent angioplasty -No active chest symptoms.   -Troponin elevated to 81 and is slightly improving.  Likely related to dehydration. -Continue metoprolol, Plavix and statins Recent Labs    07/25/20 1426 07/25/20 1710 07/25/20 1841  CKTOTAL 475*  --   --   TROPONINIHS 81* 77* 78*   ?  Eye infection -Coag negative staph aureus grew in the culture obtained from her eyes -Started on on erythromycin  Underweight -Nutrition consulted.   Mobility: PT eval ordered. Code Status:   Code Status: DNR  Nutritional status: Body mass index is 17.54 kg/m.     Diet Order            Diet 2 gram sodium Room service appropriate? No; Fluid consistency: Thin  Diet effective now                 DVT prophylaxis: enoxaparin (LOVENOX) injection 30 mg Start: 07/25/20 1530 SCDs Start: 07/25/20 1514   Antimicrobials:  None Fluid: Normal saline at 50 mill per hour Consultants: None Family Communication:  None at bedside  Status is: Inpatient  Remains inpatient appropriate because: Patient remains weak, dizzy  Dispo: The patient is from: Home              Anticipated d/c is to: SNF recommended by PT.              Anticipated d/c date is: 2 days              Patient currently is not medically stable to  d/c.  Infusions:  . sodium chloride 50 mL/hr at 07/27/20 1030    Scheduled Meds: . amLODipine  5 mg Oral Daily  . atorvastatin  10 mg Oral Daily  . clopidogrel  75 mg Oral Daily  . enoxaparin (LOVENOX) injection  30 mg Subcutaneous Q24H  . erythromycin  1 application Right Eye Q6H  . melatonin  3 mg Oral QHS  . metoprolol tartrate  25 mg Oral BID  . multivitamin with minerals  1 tablet Oral Q1200    Antimicrobials: Anti-infectives (From admission, onward)   None      PRN  meds: acetaminophen, bisacodyl, ondansetron **OR** ondansetron (ZOFRAN) IV   Objective: Vitals:   07/27/20 0729 07/27/20 1131  BP: 132/67 (!) 145/72  Pulse: 62 63  Resp: 11 (!) 21  Temp: 97.7 F (36.5 C) 98 F (36.7 C)  SpO2: 98% 100%    Intake/Output Summary (Last 24 hours) at 07/27/2020 1345 Last data filed at 07/27/2020 1328 Gross per 24 hour  Intake 1381.23 ml  Output 400 ml  Net 981.23 ml   Filed Weights   07/25/20 1230  Weight: 44.9 kg   Weight change:  Body mass index is 17.54 kg/m.   Physical Exam: General exam: Appears calm and comfortable.  Not in physical distress Skin: No rashes, lesions or ulcers. HEENT: Atraumatic, normocephalic, supple neck, no obvious bleeding Lungs: Clear to auscultation bilaterally CVS: Bradycardic, regular rhythm, no murmur  GI/Abd soft, nontender, nondistended, bowel sound present CNS: Alert, awake monitor x3 Psychiatry: Mood appropriate Extremities: No pedal edema, no calf tenderness  Data Review: I have personally reviewed the laboratory data and studies available.  Recent Labs  Lab 07/25/20 1058 07/26/20 0308  WBC 8.7 8.4  HGB 11.1* 10.6*  HCT 35.9* 32.3*  MCV 99.7 97.0  PLT 284 263   Recent Labs  Lab 07/25/20 1058 07/26/20 0308  NA 136 138  K 5.7* 4.4  CL 102 105  CO2 20* 21*  GLUCOSE 143* 122*  BUN 58* 41*  CREATININE 1.15* 0.91  CALCIUM 10.8* 10.4*    F/u labs ordered.  Signed, Lorin Glass, MD [facility] 07/27/2020

## 2020-07-27 NOTE — TOC Initial Note (Signed)
Transition of Care [facility]) - Initial/Assessment Note    Patient Details  Name: Bonnie Morton MRN: [id number] Date of Birth: Jul 19, 1928  [facility] CM/SW Contact:    Carley Hammed, LCSWA Phone Number: 07/27/2020, 11:00 AM  Clinical Narrative:                 CSW spoke with pt about SNF placement and she was agreeable, stating that she would go if it helped her get better. She had no preferences for SNF placement and agreed to be faxed out. She remembered having one Covid shot, but did not think she had a second and could not remember what type. She asked that her daughter, Shawna Orleans, be updated, but id not want her to be bothered at work. She said her daughter gets off in the evenings and would be coming into the hospital. She asked that we update the nurse with any changes and they would discuss them when she visits. CSW will complete FL2 and faxout. SW will continue to follow.   Expected Discharge Plan: Skilled Nursing Facility Barriers to Discharge: SNF Pending bed offer, Insurance Authorization, Continued Medical Work up   Patient Goals and CMS Choice Patient states their goals for this hospitalization and ongoing recovery are:: Pt states she is agreeable to SNF if it will help her get better. CMS Medicare.gov Compare Post Acute Care list provided to:: Patient Choice offered to / list presented to : Patient  Expected Discharge Plan and Services Expected Discharge Plan: Skilled Nursing Facility     Post Acute Care Choice: Skilled Nursing Facility Living arrangements for the past 2 months: Single Family Home                                      Prior Living Arrangements/Services Living arrangements for the past 2 months: Single Family Home Lives with:: Siblings Patient language and need for interpreter reviewed:: Yes        Need for Family Participation in Patient Care: No (Comment) Care giver support system in place?: Yes (comment)   Criminal  Activity/Legal Involvement Pertinent to Current Situation/Hospitalization: No - Comment as needed  Activities of Daily Living      Permission Sought/Granted Permission sought to share information with : Family Supports Permission granted to share information with : Yes, Verbal Permission Granted  Share Information with NAME: Mellanie Daphine Deutscher     Permission granted to share info w Relationship: Daughter  Permission granted to share info w Contact Information: [phone]  Emotional Assessment Appearance:: Appears stated age Attitude/Demeanor/Rapport: Engaged Affect (typically observed): Pleasant Orientation: : Oriented to Self, Oriented to Place, Oriented to  Time, Oriented to Situation Alcohol / Substance Use: Not Applicable Psych Involvement: No (comment)  Admission diagnosis:  Dehydration [E86.0] Generalized weakness [R53.1] Advanced age [R54] Loss of appetite for more than 2 weeks [R63.0] Patient Active Problem List   Diagnosis Date Noted  . Generalized weakness 07/25/2020  . Hyperkalemia 07/25/2020  . Knee effusion, right 04/05/2020  . Near syncope 06/28/2019  . Dehydration 06/28/2019  . Hypotension 06/28/2019  . CAD (coronary artery disease)   . Chest pain 12/25/2017  . Non-ST elevation (NSTEMI) myocardial infarction (HCC)   . Edema of lower extremity 08/05/2011  . Ulcer, peptic, acute or chronic 08/03/2011  . Anemia 08/01/2011  . Constipation 08/01/2011  . Hyperlipidemia 03/30/2011  . Essential hypertension 01/10/2010  . CORONARY ATHEROSCLEROSIS NATIVE  CORONARY ARTERY 01/10/2010   PCP:  Andi Devon, MD Pharmacy:   [facility] [address] [place]), [place] [facility] [address] [place] ([place]) [place] [address] Phone: [phone] Fax: [phone]     Social Determinants of Health (SDOH) Interventions    Readmission Risk Interventions No flowsheet data found.

## 2020-07-28 DIAGNOSIS — R531 Weakness: Secondary | ICD-10-CM | POA: Diagnosis not present

## 2020-07-28 LAB — GLUCOSE, CAPILLARY: Glucose-Capillary: 192 mg/dL — ABNORMAL HIGH (ref 70–99)

## 2020-07-28 MED ORDER — BACITRACIN-POLYMYXIN B 500-10000 UNIT/GM OP OINT
TOPICAL_OINTMENT | Freq: Three times a day (TID) | OPHTHALMIC | Status: DC
  Administered 2020-08-01 – 2020-08-04 (×3): 1 via OPHTHALMIC
  Filled 2020-07-28: qty 3.5

## 2020-07-28 MED ORDER — SENNOSIDES-DOCUSATE SODIUM 8.6-50 MG PO TABS
1.0000 | ORAL_TABLET | Freq: Every day | ORAL | Status: DC
  Administered 2020-07-28 – 2020-08-06 (×10): 1 via ORAL
  Filled 2020-07-28 (×10): qty 1

## 2020-07-28 MED ORDER — ENSURE ENLIVE PO LIQD
237.0000 mL | Freq: Two times a day (BID) | ORAL | Status: DC
  Administered 2020-07-28 – 2020-08-06 (×16): 237 mL via ORAL
  Filled 2020-07-28 (×4): qty 237

## 2020-07-28 MED ORDER — PNEUMOCOCCAL VAC POLYVALENT 25 MCG/0.5ML IJ INJ
0.5000 mL | INJECTION | INTRAMUSCULAR | Status: AC
  Administered 2020-07-31: 0.5 mL via INTRAMUSCULAR
  Filled 2020-07-28 (×2): qty 0.5

## 2020-07-28 MED ORDER — INFLUENZA VAC A&B SA ADJ QUAD 0.5 ML IM PRSY
0.5000 mL | PREFILLED_SYRINGE | INTRAMUSCULAR | Status: DC
  Filled 2020-07-28: qty 0.5

## 2020-07-28 MED ORDER — POLYETHYLENE GLYCOL 3350 17 G PO PACK
17.0000 g | PACK | Freq: Every day | ORAL | Status: DC | PRN
  Administered 2020-07-28 – 2020-08-04 (×3): 17 g via ORAL
  Filled 2020-07-28 (×4): qty 1

## 2020-07-28 MED ORDER — IRBESARTAN 150 MG PO TABS
150.0000 mg | ORAL_TABLET | Freq: Every day | ORAL | Status: DC
  Administered 2020-07-28 – 2020-08-06 (×10): 150 mg via ORAL
  Filled 2020-07-28 (×11): qty 1

## 2020-07-28 NOTE — Progress Notes (Signed)
CNS is resistant to erythromycin. Ok to change to polysporins per Dr. Pola Corn.  Ulyses Southward, PharmD, BCIDP, AAHIVP, CPP Infectious Disease Pharmacist 07/28/2020 12:32 PM

## 2020-07-28 NOTE — Progress Notes (Signed)
PROGRESS NOTE  Bonnie Morton  DOB: 1927/09/13  PCP: Andi Devon, MD ZOX:[id number]  DOA: 07/25/2020  LOS: 3 days   Chief Complaint  Patient presents with  . Dizziness  . Nausea   Brief narrative: Bonnie Morton is a 86 y.o. female who was brought to the ED on 07/25/2020 for generalized weakness PMH significant for coronary artery disease status post stent angioplasty, hypertension, dyslipidemia, osteoporosis and GERD Patient complains of progressively worsening generalized weakness and impaired mobility after a fall 4 weeks ago.  Associated with dizziness, lightheadedness, poor appetite. She lives with 2 of her younger sisters and states that they have tried to help each other with their ADLs but are beginning to have difficulty with doing that.  In the ED, patient was hemodynamically stable, clinically looked dehydrated. Labs remarkable for potassium elevated to 5.7, calcium elevated to 10.8. Urinalysis normal.  Chest x-ray normal. Twelve-lead EKG showed sinus rhythm, PVCs and LVH. Admitted to hospitalist service for further evaluation management.  Subjective: Patient was seen and examined this morning.   Propped up in bed.  Not in distress. Constipated for a few days. States he feels getting weak and has no motivation to get up.  Encouraged to work with physical therapy.  Assessment/Plan: Progressive generalized weakness Falls Orthostatic hypotension -Patient presented with dizziness, progressive generalized weakness and fall.  Also contributed by dehydration and deconditioning.   -Orthostatic hypotension noted in the hospital with drop in systolic blood pressure 155 to 131 on standing up.  Adequately hydrated with IV fluid.  Okay to stop IV fluid today. -Fall precautions. -Encouraged to work with physical therapy.  Hyperkalemia -Potassium mildly elevated to 5.7 on admission.  Was on losartan and HCTZ at home.   -Currently both on hold.  Given dextrose and insulin  in the ED.   -Potassium level normalized.  Continue to monitor.  Recent Labs  Lab 07/25/20 1058 07/26/20 0308  K 5.7* 4.4   History of essential Orthostatic hypotension -Prior to admission, patient was on amlodipine, metoprolol, valsartan and HCTZ.   -Currently on metoprolol and amlodipine.  Blood pressure trending up, 150s mostly.   -Resume valsartan.  Keep HCTZ on hold.  Stop IV fluid.    Elevated troponin CAD s/p stent angioplasty -No active chest symptoms.   -Troponin elevated to 81 and is slightly improving.  Likely related to dehydration. -Continue metoprolol, Plavix and statins Recent Labs    07/25/20 1426 07/25/20 1710 07/25/20 1841  CKTOTAL 475*  --   --   TROPONINIHS 81* 77* 78*   ? Eye infection -Coag negative staph aureus grew in the culture obtained from her eyes -Started on on erythromycin  Underweight -Nutrition consulted.  Mobility: PT eval ordered.  Encouraged to work with PT today. Code Status:   Code Status: DNR  Nutritional status: Body mass index is 17.54 kg/m.     Diet Order            Diet 2 gram sodium Room service appropriate? No; Fluid consistency: Thin  Diet effective now                 DVT prophylaxis: enoxaparin (LOVENOX) injection 30 mg Start: 07/25/20 1530 SCDs Start: 07/25/20 1514   Antimicrobials:  None Fluid: Stop IV fluid Consultants: None Family Communication:  None at bedside  Status is: Inpatient  Remains inpatient appropriate because: Patient remains weak, need to work with PT.  Dispo: The patient is from: Home  Anticipated d/c is to: SNF recommended by PT.              Anticipated d/c date is: 2 days              Patient currently is not medically stable to d/c.  Infusions:    Scheduled Meds: . amLODipine  5 mg Oral Daily  . atorvastatin  10 mg Oral Daily  . clopidogrel  75 mg Oral Daily  . enoxaparin (LOVENOX) injection  30 mg Subcutaneous Q24H  . erythromycin  1 application Right Eye  Q6H  . irbesartan  150 mg Oral Daily  . melatonin  3 mg Oral QHS  . metoprolol tartrate  25 mg Oral BID  . multivitamin with minerals  1 tablet Oral Q1200  . senna-docusate  1 tablet Oral QHS    Antimicrobials: Anti-infectives (From admission, onward)   None      PRN meds: acetaminophen, bisacodyl, ondansetron **OR** ondansetron (ZOFRAN) IV, polyethylene glycol   Objective: Vitals:   07/28/20 0400 07/28/20 0726  BP: (!) 158/81 (!) 148/80  Pulse: 74 71  Resp: 18 19  Temp: 97.7 F (36.5 C) 98 F (36.7 C)  SpO2:  99%    Intake/Output Summary (Last 24 hours) at 07/28/2020 1123 Last data filed at 07/27/2020 1328 Gross per 24 hour  Intake --  Output 400 ml  Net -400 ml   Filed Weights   07/25/20 1230  Weight: 44.9 kg   Weight change:  Body mass index is 17.54 kg/m.   Physical Exam: General exam: Appears calm and comfortable.  Not in physical distress Skin: No rashes, lesions or ulcers. HEENT: Atraumatic, normocephalic, supple neck, no obvious bleeding.  Right eye with crusts but improving. Lungs: Clear to auscultation bilaterally CVS: Bradycardic, regular rhythm, no murmur  GI/Abd soft, nontender, nondistended, bowel sound present CNS: Alert, awake monitor x3 Psychiatry: Mood appropriate Extremities: No pedal edema, no calf tenderness  Data Review: I have personally reviewed the laboratory data and studies available.  Recent Labs  Lab 07/25/20 1058 07/26/20 0308  WBC 8.7 8.4  HGB 11.1* 10.6*  HCT 35.9* 32.3*  MCV 99.7 97.0  PLT 284 263   Recent Labs  Lab 07/25/20 1058 07/26/20 0308  NA 136 138  K 5.7* 4.4  CL 102 105  CO2 20* 21*  GLUCOSE 143* 122*  BUN 58* 41*  CREATININE 1.15* 0.91  CALCIUM 10.8* 10.4*   F/u labs ordered.  Signed, Lorin Glass, MD [facility] 07/28/2020

## 2020-07-28 NOTE — TOC Progression Note (Addendum)
Transition of Care [facility]) - Progression Note    Patient Details  Name: Bonnie Morton MRN: [id number] Date of Birth: 1928/08/28  [facility]) CM/SW Contact  Carley Hammed, LCSWA Phone Number: 07/28/2020, 10:21 AM  Clinical Narrative:    CSW gave bed offers to pt and went over options with her. Pt requested CSW contact her niece to discuss offers as well. CSW called and left voicemail. SW will continue to follow for DC planning.   CSW spoke with niece, she and the pt chose [place]. Unable to contact [place] admissions over the weekend. Will follow for insurance auth when closer to DC.  11/21 Pt medically ready to DC, Insurance auth started, Covid requested.  Expected Discharge Plan: Skilled Nursing Facility Barriers to Discharge: SNF Pending bed offer, English as a second language teacher, Continued Medical Work up  Expected Discharge Plan and Services Expected Discharge Plan: Skilled Nursing Facility     Post Acute Care Choice: Skilled Nursing Facility Living arrangements for the past 2 months: Single Family Home                                       Social Determinants of Health (SDOH) Interventions    Readmission Risk Interventions No flowsheet data found.

## 2020-07-28 NOTE — Plan of Care (Signed)
  Problem: Clinical Measurements: Goal: Will remain free from infection Outcome: Progressing   Problem: Education: Goal: Knowledge of disease or condition will improve Outcome: Progressing   

## 2020-07-28 NOTE — Progress Notes (Signed)
Initial Nutrition Assessment  DOCUMENTATION CODES:   Underweight (Suspect PCM)  INTERVENTION:  Ensure Enlive po BID, each supplement provides 350 kcal and 20 grams of protein  Magic cup BID with meals, each supplement provides 290 kcal and 9 grams of protein  Continue MVI daily  NUTRITION DIAGNOSIS:   Inadequate oral intake related to poor appetite as evidenced by meal completion < 50%.  GOAL:   Patient will meet greater than or equal to 90% of their needs   MONITOR:   Labs, I & O's, Supplement acceptance, Weight trends, PO intake  REASON FOR ASSESSMENT:   Consult Assessment of nutrition requirement/status  ASSESSMENT:  RD working remotely.  86 year old female admitted for generalized weakness. Past medical history significant of CAD s/p stent angioplasty, HTN, HLD, thyroidectomy, and osteoporosis presents with progressive weakness after a fall 4 weeks ago associated with poor appetite, feeling dizzy, and decreased mobility.  RD attempted to reach pt via phone this afternoon, however no answer. Per notes, pt lives with 2 younger sisters who assist her with ADL's, however this has become more challenging. Patient reports feeling weak and has no motivation to get up. MD has encouraged pt to work with PT today, plans for SNF at discharge. Meal intake is poor, noted 25% of breakfast tray. Will order Ensure supplements and Magic Cup on lunch and dinner trays to aid with meeting needs. Per chart weights have trended down 27 lbs (21.5%) in the last 4 months; significant. Pt is underweight and given advanced age with progressive decline in the last month highly suspect malnutrition, however unable to identify at this time. Will plan to complete exam at follow-up.   Medications reviewed and include: Melatonin, MVI, Senokot  Labs: CBG 121, BUN 41 (H), Ca 10.4 (H), Hgb 10.6 (L), HCT 32.3 (L)   NUTRITION - FOCUSED PHYSICAL EXAM: Unable to complete at this time, RD working  remotely.  Diet Order:   Diet Order            Diet 2 gram sodium Room service appropriate? No; Fluid consistency: Thin  Diet effective now                 EDUCATION NEEDS:   No education needs have been identified at this time  Skin:  Skin Assessment: Reviewed RN Assessment  Last BM:  11/18- small type 2  Height:   Ht Readings from Last 1 Encounters:  07/25/20 5\' 3"  (1.6 m)    Weight:   Wt Readings from Last 1 Encounters:  07/25/20 44.9 kg    Ideal Body Weight:  52.3 kg  BMI:  Body mass index is 17.54 kg/m.  Estimated Nutritional Needs:   Kcal:  1300-1500  Protein:  60-70  Fluid:  >1.1 L/day   07/27/20, RD, LDN Clinical Nutrition After Hours/Weekend Pager # in Amion

## 2020-07-29 DIAGNOSIS — R531 Weakness: Secondary | ICD-10-CM | POA: Diagnosis not present

## 2020-07-29 LAB — CBC WITH DIFFERENTIAL/PLATELET
Abs Immature Granulocytes: 0.05 10*3/uL (ref 0.00–0.07)
Basophils Absolute: 0 10*3/uL (ref 0.0–0.1)
Basophils Relative: 0 %
Eosinophils Absolute: 0.1 10*3/uL (ref 0.0–0.5)
Eosinophils Relative: 1 %
HCT: 32.4 % — ABNORMAL LOW (ref 36.0–46.0)
Hemoglobin: 10.5 g/dL — ABNORMAL LOW (ref 12.0–15.0)
Immature Granulocytes: 1 %
Lymphocytes Relative: 10 %
Lymphs Abs: 0.8 10*3/uL (ref 0.7–4.0)
MCH: 30.5 pg (ref 26.0–34.0)
MCHC: 32.4 g/dL (ref 30.0–36.0)
MCV: 94.2 fL (ref 80.0–100.0)
Monocytes Absolute: 0.8 10*3/uL (ref 0.1–1.0)
Monocytes Relative: 10 %
Neutro Abs: 6.4 10*3/uL (ref 1.7–7.7)
Neutrophils Relative %: 78 %
Platelets: 282 10*3/uL (ref 150–400)
RBC: 3.44 MIL/uL — ABNORMAL LOW (ref 3.87–5.11)
RDW: 12.7 % (ref 11.5–15.5)
WBC: 8.2 10*3/uL (ref 4.0–10.5)
nRBC: 0 % (ref 0.0–0.2)

## 2020-07-29 LAB — BASIC METABOLIC PANEL
Anion gap: 8 (ref 5–15)
BUN: 12 mg/dL (ref 8–23)
CO2: 23 mmol/L (ref 22–32)
Calcium: 9.8 mg/dL (ref 8.9–10.3)
Chloride: 100 mmol/L (ref 98–111)
Creatinine, Ser: 0.68 mg/dL (ref 0.44–1.00)
GFR, Estimated: >60 mL/min (ref >60)
Glucose, Bld: 176 mg/dL — ABNORMAL HIGH (ref 70–99)
Potassium: 4 mmol/L (ref 3.5–5.1)
Sodium: 131 mmol/L — ABNORMAL LOW (ref 135–145)

## 2020-07-29 LAB — PHOSPHORUS: Phosphorus: 2.5 mg/dL (ref 2.5–4.6)

## 2020-07-29 LAB — MAGNESIUM: Magnesium: 1.6 mg/dL — ABNORMAL LOW (ref 1.7–2.4)

## 2020-07-29 MED ORDER — MAGNESIUM SULFATE 4 GM/100ML IV SOLN
4.0000 g | Freq: Once | INTRAVENOUS | Status: DC
  Filled 2020-07-29: qty 100

## 2020-07-29 MED ORDER — MAGNESIUM OXIDE 400 (241.3 MG) MG PO TABS
400.0000 mg | ORAL_TABLET | ORAL | Status: AC
  Administered 2020-07-29: 400 mg via ORAL
  Filled 2020-07-29: qty 1

## 2020-07-29 NOTE — Plan of Care (Signed)
  Problem: Education: Goal: Knowledge of General Education information will improve Description: Including pain rating scale, medication(s)/side effects and non-pharmacologic comfort measures Outcome: Progressing   Problem: Health Behavior/Discharge Planning: Goal: Ability to manage health-related needs will improve Outcome: Progressing   Problem: Clinical Measurements: Goal: Ability to maintain clinical measurements within normal limits will improve Outcome: Progressing Goal: Will remain free from infection Outcome: Progressing Goal: Diagnostic test results will improve Outcome: Progressing Goal: Respiratory complications will improve Outcome: Progressing Goal: Cardiovascular complication will be avoided Outcome: Progressing   Problem: Activity: Goal: Risk for activity intolerance will decrease Outcome: Progressing   Problem: Nutrition: Goal: Adequate nutrition will be maintained Outcome: Progressing   Problem: Coping: Goal: Level of anxiety will decrease Outcome: Progressing   Problem: Elimination: Goal: Will not experience complications related to bowel motility Outcome: Progressing Goal: Will not experience complications related to urinary retention Outcome: Progressing   Problem: Pain Managment: Goal: General experience of comfort will improve Outcome: Progressing   Problem: Safety: Goal: Ability to remain free from injury will improve Outcome: Progressing   Problem: Skin Integrity: Goal: Risk for impaired skin integrity will decrease Outcome: Progressing   Problem: Education: Goal: Knowledge of disease or condition will improve Outcome: Progressing Goal: Knowledge of secondary prevention will improve Outcome: Progressing Goal: Knowledge of patient specific risk factors addressed and post discharge goals established will improve Outcome: Progressing Goal: Individualized Educational Video(s) Outcome: Progressing   Problem: Coping: Goal: Will verbalize  positive feelings about self Outcome: Progressing   

## 2020-07-29 NOTE — Progress Notes (Signed)
PROGRESS NOTE  Bonnie Morton  DOB: December 25, 1927  PCP: Andi Devon, MD OMV:[id number]  DOA: 07/25/2020  LOS: 4 days   Chief Complaint  Patient presents with  . Dizziness  . Nausea   Brief narrative: Bonnie Morton is a 86 y.o. female who was brought to the ED on 07/25/2020 for generalized weakness PMH significant for coronary artery disease status post stent angioplasty, hypertension, dyslipidemia, osteoporosis and GERD Patient complains of progressively worsening generalized weakness and impaired mobility after a fall 4 weeks ago.  Associated with dizziness, lightheadedness, poor appetite. She lives with 2 of her younger sisters and states that they have tried to help each other with their ADLs but are beginning to have difficulty with doing that.  In the ED, patient was hemodynamically stable, clinically looked dehydrated. Labs remarkable for potassium elevated to 5.7, calcium elevated to 10.8. Urinalysis normal.  Chest x-ray normal. Twelve-lead EKG showed sinus rhythm, PVCs and LVH. Admitted to hospitalist service for further evaluation management.  Subjective: Patient was seen and examined this morning.   Not in distress.  Feels better.  Eating better.  Eyes feel better today.  Assessment/Plan: Progressive generalized weakness Falls Orthostatic hypotension -Patient presented with dizziness, progressive generalized weakness and fall.  Also contributed by dehydration and deconditioning.   -Orthostatic hypotension noted in the hospital with drop in systolic blood pressure 155 to 131 on standing up.  Adequately hydrated with IV fluid.  Currently on IV fluid. -Fall precautions. -Encouraged to work with physical therapy.  Hyperkalemia Hypomagnesemia -On admission, potassium was mildly elevated to 5.7.  Improved.   -Magnesium level today is noted to be low at 1.6.  IV replacement ordered.   Recent Labs  Lab 07/25/20 1058 07/26/20 0308 07/29/20 0204  K 5.7* 4.4 4.0  MG   --   --  1.6*  PHOS  --   --  2.5   History of essential hypertension Orthostatic hypotension -Prior to admission, patient was on amlodipine, metoprolol, valsartan and HCTZ.   -Currently on metoprolol and amlodipine.  Blood pressure trending up, 150s mostly.   -Resume valsartan.  Keep HCTZ on hold.  Currently off IV fluid.  Elevated troponin CAD s/p stent angioplasty -No active chest symptoms.   -Troponin was slightly elevated to 81 and is slightly improving.  Likely related to dehydration. -Continue metoprolol, Plavix and statins  Right eye infection -On admission, complaint of crusting and foreign body sensation.  -Culture grew coag negative staph aureus, currently improving on Polysporins ointment.    -Underweight -Nutrition consulted.  Mobility: PT eval ordered.  Encouraged to work with PT today. Code Status:   Code Status: DNR  Nutritional status: Body mass index is 17.54 kg/m. Nutrition Problem: Inadequate oral intake Etiology: poor appetite Signs/Symptoms: meal completion < 50% Diet Order            Diet 2 gram sodium Room service appropriate? No; Fluid consistency: Thin  Diet effective now                 DVT prophylaxis: enoxaparin (LOVENOX) injection 30 mg Start: 07/25/20 1530 SCDs Start: 07/25/20 1514   Antimicrobials:  None Fluid: Stop IV fluid Consultants: None Family Communication:  None at bedside  Status is: Inpatient  Remains inpatient appropriate because: Patient remains weak, need to work with PT.  Dispo: The patient is from: Home              Anticipated d/c is to: SNF recommended by PT.  Anticipated d/c date is: Medically stable for discharge whenever bed is available   Infusions:  . magnesium sulfate bolus IVPB      Scheduled Meds: . amLODipine  5 mg Oral Daily  . atorvastatin  10 mg Oral Daily  . bacitracin-polymyxin b   Right Eye TID  . clopidogrel  75 mg Oral Daily  . enoxaparin (LOVENOX) injection  30 mg  Subcutaneous Q24H  . feeding supplement  237 mL Oral BID BM  . influenza vaccine adjuvanted  0.5 mL Intramuscular Tomorrow-1000  . irbesartan  150 mg Oral Daily  . melatonin  3 mg Oral QHS  . metoprolol tartrate  25 mg Oral BID  . multivitamin with minerals  1 tablet Oral Q1200  . pneumococcal 23 valent vaccine  0.5 mL Intramuscular Tomorrow-1000  . senna-docusate  1 tablet Oral QHS    Antimicrobials: Anti-infectives (From admission, onward)   None      PRN meds: acetaminophen, bisacodyl, ondansetron **OR** ondansetron (ZOFRAN) IV, polyethylene glycol   Objective: Vitals:   07/29/20 0339 07/29/20 0824  BP:  (!) 117/96  Pulse:  84  Resp:  (!) 23  Temp: 98 F (36.7 C) 97.7 F (36.5 C)  SpO2:  99%    Intake/Output Summary (Last 24 hours) at 07/29/2020 1157 Last data filed at 07/29/2020 0831 Gross per 24 hour  Intake 264 ml  Output 230 ml  Net 34 ml   Filed Weights   07/25/20 1230  Weight: 44.9 kg   Weight change:  Body mass index is 17.54 kg/m.   Physical Exam: General exam: Appears calm and comfortable.  Not in physical distress Skin: No rashes, lesions or ulcers. HEENT: Atraumatic, normocephalic, supple neck, no obvious bleeding.  Right eye with crusts but improving. Lungs: Clear to auscultation bilaterally CVS: Bradycardic, regular rhythm, no murmur  GI/Abd soft, nontender, nondistended, bowel sound present CNS: Alert, awake, oriented x3 Psychiatry: Mood appropriate Extremities: No pedal edema, no calf tenderness  Data Review: I have personally reviewed the laboratory data and studies available.  Recent Labs  Lab 07/25/20 1058 07/26/20 0308 07/29/20 0204  WBC 8.7 8.4 8.2  NEUTROABS  --   --  6.4  HGB 11.1* 10.6* 10.5*  HCT 35.9* 32.3* 32.4*  MCV 99.7 97.0 94.2  PLT 284 263 282   Recent Labs  Lab 07/25/20 1058 07/26/20 0308 07/29/20 0204  NA 136 138 131*  K 5.7* 4.4 4.0  CL 102 105 100  CO2 20* 21* 23  GLUCOSE 143* 122* 176*  BUN 58*  41* 12  CREATININE 1.15* 0.91 0.68  CALCIUM 10.8* 10.4* 9.8  MG  --   --  1.6*  PHOS  --   --  2.5   F/u labs ordered.  Signed, Lorin Glass, MD [facility] 07/29/2020

## 2020-07-30 DIAGNOSIS — R531 Weakness: Secondary | ICD-10-CM | POA: Diagnosis not present

## 2020-07-30 LAB — RESPIRATORY PANEL BY RT PCR (FLU A&B, COVID)
Influenza A by PCR: NEGATIVE
Influenza B by PCR: NEGATIVE
SARS Coronavirus 2 by RT PCR: NEGATIVE

## 2020-07-30 NOTE — Care Management Important Message (Signed)
Important Message  Patient Details  Name: Nahal Wanless MRN: [id number] Date of Birth: 1927/09/10   Medicare Important Message Given:  Yes     Devyon Keator 07/30/2020, 10:23 AM

## 2020-07-30 NOTE — Progress Notes (Addendum)
Physical Therapy Treatment Patient Details Name: Bonnie Morton MRN: [id number] DOB: 1928/08/04 Today's Date: 07/30/2020    History of Present Illness Pt is 86 yo female who presented to ED with generalized weakness. Reports progressive weakness and difficulty with mobility s/p fall 4 weeks ago with dizziness, lightheadedness, and poor appetite. Pt with PMH including coronary artery disease status post stent angioplasty, hypertension, dyslipidemia, osteoporosis and GERD.    PT Comments    Pt received in bed, agreeable to participation in therapy. She required mod assist bed mobility, min assist to maintain sitting balance EOB, mod assist sit to stand, and max assist squat pivot transfer. Pt in recliner with feet elevated at end of session.    Follow Up Recommendations  SNF     Equipment Recommendations  Wheelchair cushion (measurements PT);Wheelchair (measurements PT)    Recommendations for Other Services       Precautions / Restrictions Precautions Precautions: Fall    Mobility  Bed Mobility Overal bed mobility: Needs Assistance Bed Mobility: Supine to Sit     Supine to sit: Mod assist;HOB elevated     General bed mobility comments: increased time, cues for sequencing, use of bed pad to scoot to EOB  Transfers Overall transfer level: Needs assistance   Transfers: Sit to/from [employer] Transfers Sit to Stand: Mod assist   Squat pivot transfers: Max assist     General transfer comment: Pt maintaining flexed posture in stance. Drop arm recliner placed next to bed for squat pivot transfer bed to recliner toward left.  Ambulation/Gait             General Gait Details: unable   Stairs             Wheelchair Mobility    Modified Rankin (Stroke Patients Only)       Balance Overall balance assessment: Needs assistance Sitting-balance support: Bilateral upper extremity supported;Feet supported Sitting balance-Leahy Scale: Poor   Postural  control: Posterior lean Standing balance support: During functional activity Standing balance-Leahy Scale: Zero                              Cognition Arousal/Alertness: Awake/alert Behavior During Therapy: WFL for tasks assessed/performed Overall Cognitive Status: Impaired/Different from baseline Area of Impairment: Orientation;Memory;Following commands;Problem solving;Safety/judgement                 Orientation Level: Disoriented to;Situation   Memory: Decreased short-term memory Following Commands: Follows one step commands consistently Safety/Judgement: Decreased awareness of safety;Decreased awareness of deficits   Problem Solving: Difficulty sequencing;Requires verbal cues;Decreased initiation;Slow processing        Exercises      General Comments General comments (skin integrity, edema, etc.): resting HR 99. Max HR during mobility 136      Pertinent Vitals/Pain Pain Assessment: No/denies pain    Home Living                      Prior Function            PT Goals (current goals can now be found in the care plan section) Acute Rehab PT Goals Patient Stated Goal: get stronger; walk independently Progress towards PT goals: Progressing toward goals    Frequency    Min 2X/week      PT Plan Current plan remains appropriate    Co-evaluation              AM-PAC PT "6 Clicks" Mobility  Outcome Measure  Help needed turning from your back to your side while in a flat bed without using bedrails?: A Lot Help needed moving from lying on your back to sitting on the side of a flat bed without using bedrails?: A Lot Help needed moving to and from a bed to a chair (including a wheelchair)?: A Lot Help needed standing up from a chair using your arms (e.g., wheelchair or bedside chair)?: A Lot Help needed to walk in hospital room?: Total Help needed climbing 3-5 steps with a railing? : Total 6 Click Score: 10    End of Session  Equipment Utilized During Treatment: Gait belt Activity Tolerance: Patient tolerated treatment well Patient left: in chair;with call bell/phone within reach;with chair alarm set Nurse Communication: Mobility status PT Visit Diagnosis: Unsteadiness on feet (R26.81);History of falling (Z91.81);Muscle weakness (generalized) (M62.81)     Time: 6160-7371 PT Time Calculation (min) (ACUTE ONLY): 16 min  Charges:  $Therapeutic Activity: 8-22 mins                     Aida Raider, PT  Office # [phone] Pager [phone]    Ilda Foil 07/30/2020, 9:49 AM

## 2020-07-30 NOTE — TOC Progression Note (Addendum)
Transition of Care [facility]) - Progression Note    Patient Details  Name: Bonnie Morton MRN: [id number] Date of Birth: 12/06/1927  Transition of [facility]) CM/SW Contact  Jimmy Picket, [place] Phone Number: 07/30/2020, 2:13 PM  Clinical Narrative:      CSW spoke to [place] at [facility] and they would be able to accept pt tomorrow 11/23 for snf. CSW reached out to pts. niece Mellaine to inform her of tomorrows pending discharge.   TOC will follow.   Expected Discharge Plan: Skilled Nursing Facility Barriers to Discharge: SNF Pending bed offer, English as a second language teacher, Continued Medical Work up  Expected Discharge Plan and Services Expected Discharge Plan: Skilled Nursing Facility     Post Acute Care Choice: Skilled Nursing Facility Living arrangements for the past 2 months: Single Family Home                                       Social Determinants of Health (SDOH) Interventions    Readmission Risk Interventions No flowsheet data found.  Jimmy Picket, Theresia Majors, [place] Clinical Social Worker [phone]

## 2020-07-30 NOTE — Progress Notes (Addendum)
PROGRESS NOTE  Bonnie Morton  DOB: [birth year]  PCP: Andi Devon, MD IRJ:[id number]  DOA: 07/25/2020  LOS: 5 days   Chief Complaint  Patient presents with  . Dizziness  . Nausea   Brief narrative: Bonnie Morton is a 86 y.o. female who was brought to the ED on 07/25/2020 for generalized weakness PMH significant for coronary artery disease status post stent angioplasty, hypertension, dyslipidemia, osteoporosis and GERD Patient complains of progressively worsening generalized weakness and impaired mobility after a fall 4 weeks ago.  Associated with dizziness, lightheadedness, poor appetite. She lives with 2 of her younger sisters and states that they have tried to help each other with their ADLs but are beginning to have difficulty with doing that.  In the ED, patient was hemodynamically stable, clinically looked dehydrated. Labs remarkable for potassium elevated to 5.7, calcium elevated to 10.8. Urinalysis normal.  Chest x-ray normal. Twelve-lead EKG showed sinus rhythm, PVCs and LVH. Admitted to hospitalist service for further evaluation management.  Subjective: Patient was seen and examined this morning.   Sitting up in chair.  Not in distress.  Feels better but has not had bowel movement in several days.  Assessment/Plan: Progressive generalized weakness Falls Orthostatic hypotension -Patient presented with dizziness, progressive generalized weakness and fall.  Also contributed by dehydration and deconditioning.   -Orthostatic hypotension noted in the hospital with drop in systolic blood pressure 155 to 131 on standing up.  Adequately hydrated with IV fluid.  Currently off IV fluid.  Encourage oral hydration. -Fall precautions. -Encouraged to work with physical therapy.  AKI -Creatinine normal at baseline, elevated up to 1.15 on admission.  Improved with hydration. Recent Labs    04/04/20 2224 04/05/20 0344 04/07/20 0054 07/25/20 1058 07/26/20 0308 07/29/20 0204   BUN 20  --  46* 58* 41* 12  CREATININE 0.87 0.85 1.00 1.15* 0.91 0.68   Hyponatremia -Sodium level is trending down, 131 yesterday.  Encourage oral hydration. -Repeat sodium level tomorrow. Recent Labs  Lab 07/25/20 1058 07/26/20 0308 07/29/20 0204  NA 136 138 131*   Hyperkalemia Hypomagnesemia -On admission, potassium was mildly elevated to 5.7.  Improved.   -Magnesium level was low at 1.6 on 11/21.  Replacement given.  Repeat tomorrow. Recent Labs  Lab 07/25/20 1058 07/26/20 0308 07/29/20 0204  K 5.7* 4.4 4.0  MG  --   --  1.6*  PHOS  --   --  2.5   Hypercalcemia -Calcium level was elevated to 10.8 on admission.  Probably because of dehydration.  Improved with IV fluid. Recent Labs  Lab 07/25/20 1058 07/26/20 0308 07/29/20 0204  CALCIUM 10.8* 10.4* 9.8   History of essential hypertension Orthostatic hypotension -Prior to admission, patient was on amlodipine, metoprolol, valsartan and HCTZ.   -Currently blood pressure is controlled on metoprolol, valsartan and amlodipine.  -HCTZ remains on hold because of hyponatremia, AKI and orthostatic hypotension.  Elevated troponin CAD s/p stent angioplasty -No active chest symptoms.   -Troponin was slightly elevated to 81 and is slightly improving.  Likely related to dehydration. -Continue metoprolol, Plavix and statins  Right eye infection -On admission, complaint of crusting and foreign body sensation.  -Culture grew coag negative staph aureus, currently improving on Polysporins ointment.    -Underweight -Nutrition consulted.  Constipation -Reports no bowel movement in several days.  Not improved with Senokot or MiraLAX.  Give enema today.  Mobility: PT eval obtained.  SNF recommended.  Encouraged to work with PT today. Code Status:   Code Status: DNR  Nutritional status: Body mass index is 17.54 kg/m. Nutrition Problem: Inadequate oral intake Etiology: poor appetite Signs/Symptoms: meal completion <  50% Diet Order            Diet 2 gram sodium Room service appropriate? No; Fluid consistency: Thin  Diet effective now                 DVT prophylaxis: enoxaparin (LOVENOX) injection 30 mg Start: 07/25/20 1530 SCDs Start: 07/25/20 1514   Antimicrobials:  None Fluid: Not on IV fluid Consultants: None Family Communication:  None at bedside  Status is: Inpatient  Remains inpatient appropriate because: Patient remains weak, need to work with PT.  Dispo: The patient is from: Home              Anticipated d/c is to: SNF recommended by PT.              Anticipated d/c date is: Medically stable for discharge whenever bed is available   Infusions:    Scheduled Meds: . amLODipine  5 mg Oral Daily  . atorvastatin  10 mg Oral Daily  . bacitracin-polymyxin b   Right Eye TID  . clopidogrel  75 mg Oral Daily  . enoxaparin (LOVENOX) injection  30 mg Subcutaneous Q24H  . feeding supplement  237 mL Oral BID BM  . influenza vaccine adjuvanted  0.5 mL Intramuscular Tomorrow-1000  . irbesartan  150 mg Oral Daily  . melatonin  3 mg Oral QHS  . metoprolol tartrate  25 mg Oral BID  . multivitamin with minerals  1 tablet Oral Q1200  . pneumococcal 23 valent vaccine  0.5 mL Intramuscular Tomorrow-1000  . senna-docusate  1 tablet Oral QHS    Antimicrobials: Anti-infectives (From admission, onward)   None      PRN meds: acetaminophen, bisacodyl, ondansetron **OR** ondansetron (ZOFRAN) IV, polyethylene glycol   Objective: Vitals:   07/30/20 0727 07/30/20 0736  BP: 137/87   Pulse:  80  Resp: 18 18  Temp: 97.6 F (36.4 C)   SpO2:  100%    Intake/Output Summary (Last 24 hours) at 07/30/2020 1059 Last data filed at 07/29/2020 2100 Gross per 24 hour  Intake 536 ml  Output 150 ml  Net 386 ml   Filed Weights   07/25/20 1230  Weight: 44.9 kg   Weight change:  Body mass index is 17.54 kg/m.   Physical Exam: General exam: Appears calm and comfortable.  Not in physical  distress. Skin: No rashes, lesions or ulcers. HEENT: Atraumatic, normocephalic, supple neck, no obvious bleeding.  Right eye with crusts but improving. Lungs: Clear to auscultation bilaterally. CVS: Regular rate and rhythm, no murmur. GI/Abd soft, nontender, nondistended, bowel sound present CNS: Alert, awake, oriented x3 Psychiatry: Mood appropriate Extremities: No pedal edema, no calf tenderness  Data Review: I have personally reviewed the laboratory data and studies available.  Recent Labs  Lab 07/25/20 1058 07/26/20 0308 07/29/20 0204  WBC 8.7 8.4 8.2  NEUTROABS  --   --  6.4  HGB 11.1* 10.6* 10.5*  HCT 35.9* 32.3* 32.4*  MCV 99.7 97.0 94.2  PLT 284 263 282   Recent Labs  Lab 07/25/20 1058 07/26/20 0308 07/29/20 0204  NA 136 138 131*  K 5.7* 4.4 4.0  CL 102 105 100  CO2 20* 21* 23  GLUCOSE 143* 122* 176*  BUN 58* 41* 12  CREATININE 1.15* 0.91 0.68  CALCIUM 10.8* 10.4* 9.8  MG  --   --  1.6*  PHOS  --   --  2.5   F/u labs ordered.  Signed, Lorin Glass, MD [facility] 07/30/2020

## 2020-07-31 DIAGNOSIS — R531 Weakness: Secondary | ICD-10-CM | POA: Diagnosis not present

## 2020-07-31 LAB — OSMOLALITY, URINE: Osmolality, Ur: 397 mOsm/kg (ref 300–900)

## 2020-07-31 LAB — CBC WITH DIFFERENTIAL/PLATELET
Abs Immature Granulocytes: 0.04 10*3/uL (ref 0.00–0.07)
Basophils Absolute: 0 10*3/uL (ref 0.0–0.1)
Basophils Relative: 1 %
Eosinophils Absolute: 0.2 10*3/uL (ref 0.0–0.5)
Eosinophils Relative: 2 %
HCT: 32.7 % — ABNORMAL LOW (ref 36.0–46.0)
Hemoglobin: 10.9 g/dL — ABNORMAL LOW (ref 12.0–15.0)
Immature Granulocytes: 1 %
Lymphocytes Relative: 11 %
Lymphs Abs: 1 10*3/uL (ref 0.7–4.0)
MCH: 31.1 pg (ref 26.0–34.0)
MCHC: 33.3 g/dL (ref 30.0–36.0)
MCV: 93.4 fL (ref 80.0–100.0)
Monocytes Absolute: 0.9 10*3/uL (ref 0.1–1.0)
Monocytes Relative: 10 %
Neutro Abs: 6.6 10*3/uL (ref 1.7–7.7)
Neutrophils Relative %: 75 %
Platelets: 274 10*3/uL (ref 150–400)
RBC: 3.5 MIL/uL — ABNORMAL LOW (ref 3.87–5.11)
RDW: 12.3 % (ref 11.5–15.5)
WBC: 8.8 10*3/uL (ref 4.0–10.5)
nRBC: 0 % (ref 0.0–0.2)

## 2020-07-31 LAB — BASIC METABOLIC PANEL
Anion gap: 11 (ref 5–15)
Anion gap: 11 (ref 5–15)
BUN: 16 mg/dL (ref 8–23)
BUN: 13 mg/dL (ref 8–23)
CO2: 23 mmol/L (ref 22–32)
CO2: 23 mmol/L (ref 22–32)
Calcium: 9.4 mg/dL (ref 8.9–10.3)
Calcium: 9.6 mg/dL (ref 8.9–10.3)
Chloride: 87 mmol/L — ABNORMAL LOW (ref 98–111)
Chloride: 87 mmol/L — ABNORMAL LOW (ref 98–111)
Creatinine, Ser: 0.64 mg/dL (ref 0.44–1.00)
Creatinine, Ser: 0.7 mg/dL (ref 0.44–1.00)
GFR, Estimated: >60 mL/min (ref >60)
GFR, Estimated: >60 mL/min (ref >60)
Glucose, Bld: 158 mg/dL — ABNORMAL HIGH (ref 70–99)
Glucose, Bld: 218 mg/dL — ABNORMAL HIGH (ref 70–99)
Potassium: 4.2 mmol/L (ref 3.5–5.1)
Potassium: 4.6 mmol/L (ref 3.5–5.1)
Sodium: 121 mmol/L — ABNORMAL LOW (ref 135–145)
Sodium: 121 mmol/L — ABNORMAL LOW (ref 135–145)

## 2020-07-31 LAB — OSMOLALITY: Osmolality: 263 mOsm/kg — ABNORMAL LOW (ref 275–295)

## 2020-07-31 LAB — SODIUM, URINE, RANDOM: Sodium, Ur: 55 mmol/L

## 2020-07-31 LAB — MAGNESIUM: Magnesium: 1.4 mg/dL — ABNORMAL LOW (ref 1.7–2.4)

## 2020-07-31 LAB — CREATININE, URINE, RANDOM: Creatinine, Urine: 53.1 mg/dL

## 2020-07-31 MED ORDER — MAGNESIUM SULFATE 4 GM/100ML IV SOLN
4.0000 g | Freq: Once | INTRAVENOUS | Status: AC
  Administered 2020-07-31: 4 g via INTRAVENOUS
  Filled 2020-07-31: qty 100

## 2020-07-31 NOTE — Progress Notes (Signed)
PROGRESS NOTE  Bonnie Morton  DOB: 03-15-1928  PCP: Andi Devon, MD ZOX:[id number]  DOA: 07/25/2020  LOS: 6 days   Chief Complaint  Patient presents with  . Dizziness  . Nausea   Brief narrative: Bonnie Morton is a 86 y.o. female who was brought to the ED on 07/25/2020 for generalized weakness PMH significant for coronary artery disease status post stent angioplasty, hypertension, dyslipidemia, osteoporosis and GERD Patient complains of progressively worsening generalized weakness and impaired mobility after a fall 4 weeks ago.  Associated with dizziness, lightheadedness, poor appetite. She lives with 2 of her younger sisters and states that they have tried to help each other with their ADLs but are beginning to have difficulty with doing that.  In the ED, patient was hemodynamically stable, clinically looked dehydrated. Labs remarkable for potassium elevated to 5.7, calcium elevated to 10.8. Urinalysis normal.  Chest x-ray normal. Twelve-lead EKG showed sinus rhythm, PVCs and LVH. Admitted to hospitalist service for further evaluation management.  Subjective: Patient was seen and examined this morning.   Propped up in bed. Not in distress.  She is feeling better after she had a bowel movement yesterday. She has a bed at SNF today but her morning blood work today shows sodium level significantly low at 121.  Assessment/Plan: Progressive generalized weakness Falls Orthostatic hypotension -Patient presented with dizziness, progressive generalized weakness and fall.  Also contributed by dehydration and deconditioning.   -Orthostatic hypotension was noted in the hospital with drop in systolic blood pressure 155 to 131 on standing up.  Adequately hydrated with IV fluid.  Currently off IV fluid.  Encourage oral hydration. -Fall precautions. -Encouraged to work with physical therapy.  Acute hyponatremia -Sodium level was normal at presentation, it is significantly down to 121  today. -Unclear cause.  Prior to admission was he was on oxygen which has been on hold since admission. -Obtain serum osmolality, urine osmolality, urine sodium and creatinine level.  Repeat sodium level tomorrow. -Clinically patient is euvolemic.  I would hold off on further hydration at this time Recent Labs  Lab 07/25/20 1058 07/26/20 0308 07/29/20 0204 07/31/20 0322 07/31/20 1114  NA 136 138 131* 121* 121*   AKI -Creatinine normal at baseline, elevated up to 1.15 on admission.  Improved with hydration. Recent Labs    04/04/20 2224 04/05/20 0344 04/07/20 0054 07/25/20 1058 07/26/20 0308 07/29/20 0204 07/31/20 0322 07/31/20 1114  BUN 20  --  46* 58* 41* 12 16 13   CREATININE 0.87 0.85 1.00 1.15* 0.91 0.68 0.64 0.70   Hyperkalemia Hypomagnesemia -On admission, potassium was mildly elevated to 5.7.  Improved.   -Magnesium level was low at 1.6 on 11/21.  Replacement given.  Add mag level to morning lab today. Recent Labs  Lab 07/25/20 1058 07/26/20 0308 07/29/20 0204 07/31/20 0322 07/31/20 1114  K 5.7* 4.4 4.0 4.2 4.6  MG  --   --  1.6*  --   --   PHOS  --   --  2.5  --   --    Hypercalcemia -Calcium level was elevated to 10.8 on admission.  Probably because of dehydration.  Improved with IV fluid. Recent Labs  Lab 07/25/20 1058 07/26/20 0308 07/29/20 0204 07/31/20 0322 07/31/20 1114  CALCIUM 10.8* 10.4* 9.8 9.4 9.6   History of essential hypertension Orthostatic hypotension -Prior to admission, patient was on amlodipine, metoprolol, valsartan and HCTZ.   -Currently blood pressure is controlled on metoprolol, valsartan and amlodipine.  -HCTZ remains on hold because of hyponatremia,  AKI and orthostatic hypotension.  Elevated troponin CAD s/p stent angioplasty -No active chest symptoms.   -Troponin was slightly elevated to 81 and is slightly improving.  Likely related to dehydration. -Continue metoprolol, Plavix and statins  Right eye infection -On  admission, complaint of crusting and foreign body sensation.  -Culture grew coag negative staph aureus, currently improving on Polysporins ointment.    -Underweight -Nutrition consulted.  Constipation -Improved with enema yesterday.  Continue Senokot and MiraLAX  Mobility: PT eval obtained.  SNF recommended.   Code Status:   Code Status: DNR  Nutritional status: Body mass index is 17.54 kg/m. Nutrition Problem: Inadequate oral intake Etiology: poor appetite Signs/Symptoms: meal completion < 50% Diet Order            Diet 2 gram sodium Room service appropriate? No; Fluid consistency: Thin  Diet effective now                 DVT prophylaxis: enoxaparin (LOVENOX) injection 30 mg Start: 07/25/20 1530 SCDs Start: 07/25/20 1514   Antimicrobials:  None Fluid: Not on IV fluid Consultants: None Family Communication:  None at bedside  Status is: Inpatient  Remains inpatient appropriate because: Hold off discharge today because of significant acute hyponatremia  Dispo: The patient is from: Home              Anticipated d/c is to: SNF              Anticipated d/c date is: 1-2 days.               Patient currently is not medically stable to d/c.         Infusions:    Scheduled Meds: . amLODipine  5 mg Oral Daily  . atorvastatin  10 mg Oral Daily  . bacitracin-polymyxin b   Right Eye TID  . clopidogrel  75 mg Oral Daily  . enoxaparin (LOVENOX) injection  30 mg Subcutaneous Q24H  . feeding supplement  237 mL Oral BID BM  . influenza vaccine adjuvanted  0.5 mL Intramuscular Tomorrow-1000  . irbesartan  150 mg Oral Daily  . melatonin  3 mg Oral QHS  . metoprolol tartrate  25 mg Oral BID  . multivitamin with minerals  1 tablet Oral Q1200  . senna-docusate  1 tablet Oral QHS    Antimicrobials: Anti-infectives (From admission, onward)   None      PRN meds: acetaminophen, bisacodyl, ondansetron **OR** ondansetron (ZOFRAN) IV, polyethylene glycol    Objective: Vitals:   07/31/20 0958 07/31/20 1058  BP:  140/71  Pulse: 76 83  Resp:  19  Temp:  98.5 F (36.9 C)  SpO2:  100%    Intake/Output Summary (Last 24 hours) at 07/31/2020 1316 Last data filed at 07/31/2020 0352 Gross per 24 hour  Intake --  Output 600 ml  Net -600 ml   Filed Weights   07/25/20 1230  Weight: 44.9 kg   Weight change:  Body mass index is 17.54 kg/m.   Physical Exam: General exam: Appears calm and comfortable.  Not in physical distress. Skin: No rashes, lesions or ulcers. HEENT: Atraumatic, normocephalic, supple neck, no obvious bleeding.  Right eye with crusts but improving. Lungs: Clear to auscultation bilaterally CVS: Regular rate and rhythm, no murmur. GI/Abd soft, nontender, nondistended, bowel sound present CNS: Alert, awake monitor x3 Psychiatry: Mood appropriate Extremities: No pedal edema, no calf tenderness  Data Review: I have personally reviewed the laboratory data and studies available.  Recent Labs  Lab 07/25/20 1058 07/26/20 0308 07/29/20 0204 07/31/20 0322  WBC 8.7 8.4 8.2 8.8  NEUTROABS  --   --  6.4 6.6  HGB 11.1* 10.6* 10.5* 10.9*  HCT 35.9* 32.3* 32.4* 32.7*  MCV 99.7 97.0 94.2 93.4  PLT 284 263 282 274   Recent Labs  Lab 07/25/20 1058 07/26/20 0308 07/29/20 0204 07/31/20 0322 07/31/20 1114  NA 136 138 131* 121* 121*  K 5.7* 4.4 4.0 4.2 4.6  CL 102 105 100 87* 87*  CO2 20* 21* 23 23 23   GLUCOSE 143* 122* 176* 158* 218*  BUN 58* 41* 12 16 13   CREATININE 1.15* 0.91 0.68 0.64 0.70  CALCIUM 10.8* 10.4* 9.8 9.4 9.6  MG  --   --  1.6*  --   --   PHOS  --   --  2.5  --   --    F/u labs ordered.  Signed, , MD [facility] 07/31/2020

## 2020-07-31 NOTE — Progress Notes (Signed)
Pt has been approved for SNF rehab at [facility].   Auth: [id number] Ref #: [id number] 07/30/2020-08/01/2020  CM: Rica Koyanagi: [phone]

## 2020-08-01 DIAGNOSIS — R531 Weakness: Secondary | ICD-10-CM | POA: Diagnosis not present

## 2020-08-01 LAB — BASIC METABOLIC PANEL
Anion gap: 9 (ref 5–15)
Anion gap: 11 (ref 5–15)
Anion gap: 8 (ref 5–15)
BUN: 17 mg/dL (ref 8–23)
BUN: 14 mg/dL (ref 8–23)
BUN: 12 mg/dL (ref 8–23)
CO2: 24 mmol/L (ref 22–32)
CO2: 23 mmol/L (ref 22–32)
CO2: 22 mmol/L (ref 22–32)
Calcium: 9.4 mg/dL (ref 8.9–10.3)
Calcium: 9.6 mg/dL (ref 8.9–10.3)
Calcium: 9.2 mg/dL (ref 8.9–10.3)
Chloride: 87 mmol/L — ABNORMAL LOW (ref 98–111)
Chloride: 87 mmol/L — ABNORMAL LOW (ref 98–111)
Chloride: 90 mmol/L — ABNORMAL LOW (ref 98–111)
Creatinine, Ser: 0.79 mg/dL (ref 0.44–1.00)
Creatinine, Ser: 0.63 mg/dL (ref 0.44–1.00)
Creatinine, Ser: 0.62 mg/dL (ref 0.44–1.00)
GFR, Estimated: >60 mL/min (ref >60)
GFR, Estimated: >60 mL/min (ref >60)
GFR, Estimated: >60 mL/min (ref >60)
Glucose, Bld: 197 mg/dL — ABNORMAL HIGH (ref 70–99)
Glucose, Bld: 165 mg/dL — ABNORMAL HIGH (ref 70–99)
Glucose, Bld: 133 mg/dL — ABNORMAL HIGH (ref 70–99)
Potassium: 4.6 mmol/L (ref 3.5–5.1)
Potassium: 4.7 mmol/L (ref 3.5–5.1)
Potassium: 4.8 mmol/L (ref 3.5–5.1)
Sodium: 120 mmol/L — ABNORMAL LOW (ref 135–145)
Sodium: 121 mmol/L — ABNORMAL LOW (ref 135–145)
Sodium: 120 mmol/L — ABNORMAL LOW (ref 135–145)

## 2020-08-01 LAB — MAGNESIUM: Magnesium: 2.5 mg/dL — ABNORMAL HIGH (ref 1.7–2.4)

## 2020-08-01 LAB — PHOSPHORUS: Phosphorus: 2.3 mg/dL — ABNORMAL LOW (ref 2.5–4.6)

## 2020-08-01 MED ORDER — SODIUM CHLORIDE 1 G PO TABS
1.0000 g | ORAL_TABLET | Freq: Two times a day (BID) | ORAL | Status: DC
  Administered 2020-08-01 – 2020-08-02 (×3): 1 g via ORAL
  Filled 2020-08-01 (×3): qty 1

## 2020-08-01 MED ORDER — SODIUM CHLORIDE 0.9 % IV SOLN: INTRAVENOUS | Status: DC

## 2020-08-01 NOTE — Progress Notes (Signed)
Nutrition Follow-up  DOCUMENTATION CODES:   Underweight, Severe malnutrition in context of chronic illness  INTERVENTION:   -Downgrade diet to dysphagia 3 (advanced mechanical soft) for ease of intake -Continue Ensure Enlive po BID, each supplement provides 350 kcal and 20 grams of protein -Continue Magic cup BID with meals, each supplement provides 290 kcal and 9 grams of protein -Continue MVI with minerals daily  NUTRITION DIAGNOSIS:   Severe Malnutrition related to chronic illness (CAD) as evidenced by percent weight loss, energy intake < or equal to 75% for > or equal to 1 month, severe fat depletion, severe muscle depletion.  Ongoing  GOAL:   Patient will meet greater than or equal to 90% of their needs  Progressing   MONITOR:   PO intake, Supplement acceptance, Labs, Weight trends, Skin, I & O's  REASON FOR ASSESSMENT:   Consult Assessment of nutrition requirement/status  ASSESSMENT:   86 year old female admitted for generalized weakness. Past medical history significant of CAD s/p stent angioplasty, HTN, HLD, thyroidectomy, and osteoporosis presents with progressive weakness after a fall 4 weeks ago associated with poor appetite, feeling dizzy, and decreased mobility.  Reviewed I/O's: +257 ml x 24 hours and +1.7 L since admission  UOP: 700 ml x 24 hours  Spoke with pt at bedside, who was pleasant and in good spirits today. She reports that her appetite continues to be decreased and cites that she is having difficulty chewing her food secondary to lack of dentures. Per her report, meats and breads are most difficult for her. She shares she has been consuming about 25% of her meals (documented meal intake 25-50%). Pt shares that she enjoys the Ensure supplements and has been consuming them.   Pt has been struggling with decreased over intake for the past several months. She shares that she has had increased difficulty preparing meals and shopping for groceries for  herself and often relies on family members to cook for her. She reports she consumes at least breakfast (grits and eggs) PTA, but may not consume anything else throughout the day based on how she is feeling and what is available.   She endorses wt loss, but unsure of UBW of how much weight she has lost. Reviewed wt hx; pt has experienced a 21.5% wt loss over the past 4 months, which is significant for time frame.  Discussed with pt importance of good meal and supplement intake to promote healing. She is amenable to continue Ensure supplements and diet downgrade for ease of intake.   Medications reviewed and include senokot and 0.9% sodium chloride infusion @ 75 ml/hr.   Labs reviewed: Na: 121.   NUTRITION - FOCUSED PHYSICAL EXAM:    Most Recent Value  Orbital Region Moderate depletion  Upper Arm Region Severe depletion  Thoracic and Lumbar Region Moderate depletion  Buccal Region Severe depletion  Temple Region Severe depletion  Clavicle Bone Region Severe depletion  Clavicle and Acromion Bone Region Severe depletion  Scapular Bone Region Severe depletion  Dorsal Hand Severe depletion  Patellar Region Severe depletion  Anterior Thigh Region Severe depletion  Posterior Calf Region Severe depletion  Edema (RD Assessment) None  Hair Reviewed  Eyes Reviewed  Mouth Reviewed  Skin Reviewed  Nails Reviewed       Diet Order:   Diet Order            Diet regular Room service appropriate? Yes; Fluid consistency: Thin  Diet effective now  EDUCATION NEEDS:   Education needs have been addressed  Skin:  Skin Assessment: Reviewed RN Assessment  Last BM:  07/31/20  Height:   Ht Readings from Last 1 Encounters:  07/25/20 5\' 3"  (1.6 m)    Weight:   Wt Readings from Last 1 Encounters:  07/25/20 44.9 kg    Ideal Body Weight:  52.3 kg  BMI:  Body mass index is 17.54 kg/m.  Estimated Nutritional Needs:   Kcal:  1300-1500  Protein:  60-70  Fluid:   >1.1 L/day    07/27/20, RD, LDN, CDCES Registered Dietitian II Certified Diabetes Care and Education Specialist Please refer to [facility] for RD and/or RD on-call/weekend/after hours pager

## 2020-08-01 NOTE — Progress Notes (Signed)
PROGRESS NOTE    Bonnie Morton  HER:[id number] DOB: [birth year] DOA: 07/25/2020 PCP: Andi Devon, MD   Brief Narrative:  86 year old with history of CAD status post stent, HTN, hyperlipidemia, osteoporosis, GERD admitted for generalized weakness poor mobility and poor oral intake.  Admission for severe dehydration complicated by hyponatremia.  PT recommended SNF.   Assessment & Plan:   Principal Problem:   Generalized weakness Active Problems:   Essential hypertension   CAD (coronary artery disease)   Dehydration   Hyperkalemia   Progressive generalized weakness Falls Orthostatic hypotension -This is improved with hydration.  PT recommended SNF therefore arrangements being made  Acute hyponatremia -Likely from hypovolemia and total body depletion as she has been following low-salt diet.  Will start patient on IV fluids, monitor BMP every 8 hours.  Start sodium chloride 1 g twice daily.  Change 2 g salt to regular diet.  AKI -Resolved with hydration  Hypercalcemia -Resolved with hydration  History of essential hypertension Orthostatic hypotension -Currently blood pressure is controlled on metoprolol, valsartan and amlodipine.  -HCTZ remains on hold because of hyponatremia, AKI and orthostatic hypotension.  Elevated troponin CAD s/p stent angioplasty No chest pain continue home meds  Right eye infection -On admission, complaint of crusting and foreign body sensation.  -Culture grew coag negative staph aureus, currently improving on Polysporins ointment.    -Underweight -Nutrition consulted.  Constipation -Bowel regimen  DVT prophylaxis: Lovenox Code Status: DNR Family Communication:  Spoke with Mellanie  Status is: Inpatient  Remains inpatient appropriate because:Inpatient level of care appropriate due to severity of illness   Dispo: The patient is from: Home              Anticipated d/c is to: SNF              Anticipated d/c date is: 2  days              Patient currently is not medically stable to d/c. Still hyponatermia, needs IVF.     Body mass index is 17.54 kg/m.     Subjective: Feels better today but somewhat poor oral intake. Overall improving.   Typically follows very low-salt diet  Review of Systems Otherwise negative except as per HPI, including: General: Denies fever, chills, night sweats or unintended weight loss. Resp: Denies cough, wheezing, shortness of breath. Cardiac: Denies chest pain, palpitations, orthopnea, paroxysmal nocturnal dyspnea. GI: Denies abdominal pain, nausea, vomiting, diarrhea or constipation GU: Denies dysuria, frequency, hesitancy or incontinence MS: Denies muscle aches, joint pain or swelling Neuro: Denies headache, neurologic deficits (focal weakness, numbness, tingling), abnormal gait Psych: Denies anxiety, depression, SI/HI/AVH Skin: Denies new rashes or lesions ID: Denies sick contacts, exotic exposures, travel  Examination:  General exam: Appears calm and comfortable ; elderly frail  Respiratory system: Clear to auscultation. Respiratory effort normal. Cardiovascular system: S1 & S2 heard, RRR. No JVD, murmurs, rubs, gallops or clicks. No pedal edema. Gastrointestinal system: Abdomen is nondistended, soft and nontender. No organomegaly or masses felt. Normal bowel sounds heard. Central nervous system: Alert and oriented. No focal neurological deficits. Extremities: Symmetric 5 x 5 power. Skin: No rashes, lesions or ulcers Psychiatry: Judgement and insight appear normal. Mood & affect appropriate.     Objective: Vitals:   07/31/20 2324 08/01/20 0312 08/01/20 0820 08/01/20 1038  BP: 131/70 121/69 120/68 128/69  Pulse: 75 60 91 81  Resp: 17 17 16    Temp: 98 F (36.7 C) 98 F (36.7 C) 97.8 F (36.6 C)  TempSrc: Oral Oral Oral   SpO2: 98% 99%    Weight:      Height:        Intake/Output Summary (Last 24 hours) at 08/01/2020 1141 Last data filed at  07/31/2020 1755 Gross per 24 hour  Intake 717 ml  Output 700 ml  Net 17 ml   Filed Weights   07/25/20 1230  Weight: 44.9 kg     Data Reviewed:   CBC: Recent Labs  Lab 07/26/20 0308 07/29/20 0204 07/31/20 0322  WBC 8.4 8.2 8.8  NEUTROABS  --  6.4 6.6  HGB 10.6* 10.5* 10.9*  HCT 32.3* 32.4* 32.7*  MCV 97.0 94.2 93.4  PLT 263 282 274   Basic Metabolic Panel: Recent Labs  Lab 07/26/20 0308 07/29/20 0204 07/31/20 0322 07/31/20 1114 07/31/20 1418 08/01/20 0443  NA 138 131* 121* 121*  --  120*  K 4.4 4.0 4.2 4.6  --  4.6  CL 105 100 87* 87*  --  87*  CO2 21* 23 23 23   --  24  GLUCOSE 122* 176* 158* 218*  --  197*  BUN 41* 12 16 13   --  17  CREATININE 0.91 0.68 0.64 0.70  --  0.79  CALCIUM 10.4* 9.8 9.4 9.6  --  9.4  MG  --  1.6*  --   --  1.4* 2.5*  PHOS  --  2.5  --   --   --  2.3*   GFR: Estimated Creatinine Clearance: 33.1 mL/min (by C-G formula based on SCr of 0.79 mg/dL). Liver Function Tests: No results for input(s): AST, ALT, ALKPHOS, BILITOT, PROT, ALBUMIN in the last 168 hours. No results for input(s): LIPASE, AMYLASE in the last 168 hours. No results for input(s): AMMONIA in the last 168 hours. Coagulation Profile: No results for input(s): INR, PROTIME in the last 168 hours. Cardiac Enzymes: Recent Labs  Lab 07/25/20 1426  CKTOTAL 475*   BNP (last 3 results) No results for input(s): PROBNP in the last 8760 hours. HbA1C: No results for input(s): HGBA1C in the last 72 hours. CBG: Recent Labs  Lab 07/25/20 1651 07/28/20 2025  GLUCAP 121* 192*   Lipid Profile: No results for input(s): CHOL, HDL, LDLCALC, TRIG, CHOLHDL, LDLDIRECT in the last 72 hours. Thyroid Function Tests: No results for input(s): TSH, T4TOTAL, FREET4, T3FREE, THYROIDAB in the last 72 hours. Anemia Panel: No results for input(s): VITAMINB12, FOLATE, FERRITIN, TIBC, IRON, RETICCTPCT in the last 72 hours. Sepsis Labs: No results for input(s): PROCALCITON, LATICACIDVEN in  the last 168 hours.  Recent Results (from the past 240 hour(s))  Eye culture     Status: None   Collection Time: 07/25/20 12:13 PM   Specimen: Eye  Result Value Ref Range Status   Specimen Description EYE  Final   Special Requests Normal  Final   Gram Stain   Final    ABUNDANT WBC PRESENT, PREDOMINANTLY PMN RARE GRAM POSITIVE COCCI Performed at [facility], [address], [place], [address] [place]    Culture MODERATE STAPHYLOCOCCUS EPIDERMIDIS  Final   Report Status 07/27/2020 FINAL  Final   Organism ID, Bacteria STAPHYLOCOCCUS EPIDERMIDIS  Final      Susceptibility   Staphylococcus epidermidis - MIC*    CIPROFLOXACIN <=0.5 SENSITIVE Sensitive     ERYTHROMYCIN >=8 RESISTANT Resistant     GENTAMICIN <=0.5 SENSITIVE Sensitive     OXACILLIN <=0.25 SENSITIVE Sensitive     TETRACYCLINE <=1 SENSITIVE Sensitive     VANCOMYCIN  2 SENSITIVE Sensitive     TRIMETH/SULFA 80 RESISTANT Resistant     CLINDAMYCIN <=0.25 SENSITIVE Sensitive     RIFAMPIN <=0.5 SENSITIVE Sensitive     Inducible Clindamycin NEGATIVE Sensitive     * MODERATE STAPHYLOCOCCUS EPIDERMIDIS  Respiratory Panel by RT PCR (Flu A&B, Covid) - Nasopharyngeal Swab     Status: None   Collection Time: 07/25/20  3:43 PM   Specimen: Nasopharyngeal Swab  Result Value Ref Range Status   SARS Coronavirus 2 by RT PCR NEGATIVE NEGATIVE Final    Comment: (NOTE) SARS-CoV-2 target nucleic acids are NOT DETECTED.  The SARS-CoV-2 RNA is generally detectable in upper respiratoy specimens during the acute phase of infection. The lowest concentration of SARS-CoV-2 viral copies this assay can detect is 131 copies/mL. A negative result does not preclude SARS-Cov-2 infection and should not be used as the sole basis for treatment or other patient management decisions. A negative result may occur with  improper specimen collection/handling, submission of specimen other than nasopharyngeal swab, presence of viral mutation(s) within  the areas targeted by this assay, and inadequate number of viral copies (<131 copies/mL). A negative result must be combined with clinical observations, patient history, and epidemiological information. The expected result is Negative.  Fact Sheet for Patients:  https://www.moore.com/https://www.fda.gov/media/142436/download  Fact Sheet for Healthcare Providers:  https://www.young.biz/https://www.fda.gov/media/142435/download  This test is no t yet approved or cleared by the [place] FDA and  has been authorized for detection and/or diagnosis of SARS-CoV-2 by FDA under an Emergency Use Authorization (EUA). This EUA will remain  in effect (meaning this test can be used) for the duration of the COVID-19 declaration under Section 564(b)(1) of the Act, 21 U.S.C. section 360bbb-3(b)(1), unless the authorization is terminated or revoked sooner.     Influenza A by PCR NEGATIVE NEGATIVE Final   Influenza B by PCR NEGATIVE NEGATIVE Final    Comment: (NOTE) The Xpert Xpress SARS-CoV-2/FLU/RSV assay is intended as an aid in  the diagnosis of influenza from Nasopharyngeal swab specimens and  should not be used as a sole basis for treatment. Nasal washings and  aspirates are unacceptable for Xpert Xpress SARS-CoV-2/FLU/RSV  testing.  Fact Sheet for Patients: https://www.moore.com/https://www.fda.gov/media/142436/download  Fact Sheet for Healthcare Providers: https://www.young.biz/https://www.fda.gov/media/142435/download  This test is not yet approved or cleared by the [place] FDA and  has been authorized for detection and/or diagnosis of SARS-CoV-2 by  FDA under an Emergency Use Authorization (EUA). This EUA will remain  in effect (meaning this test can be used) for the duration of the  Covid-19 declaration under Section 564(b)(1) of the Act, 21  U.S.C. section 360bbb-3(b)(1), unless the authorization is  terminated or revoked. Performed at [facility], [address] [place], [place] [address]   Respiratory Panel by RT PCR (Flu A&B, Covid) -  Nasopharyngeal Swab     Status: None   Collection Time: 07/30/20  3:33 PM   Specimen: Nasopharyngeal Swab; Nasopharyngeal(NP) swabs in vial transport medium  Result Value Ref Range Status   SARS Coronavirus 2 by RT PCR NEGATIVE NEGATIVE Final    Comment: (NOTE) SARS-CoV-2 target nucleic acids are NOT DETECTED.  The SARS-CoV-2 RNA is generally detectable in upper respiratoy specimens during the acute phase of infection. The lowest concentration of SARS-CoV-2 viral copies this assay can detect is 131 copies/mL. A negative result does not preclude SARS-Cov-2 infection and should not be used as the sole basis for treatment or other patient management decisions. A negative result may occur with  improper specimen collection/handling, submission of specimen other than nasopharyngeal swab, presence of viral mutation(s) within the areas targeted by this assay, and inadequate number of viral copies (<131 copies/mL). A negative result must be combined with clinical observations, patient history, and epidemiological information. The expected result is Negative.  Fact Sheet for Patients:  https://www.moore.com/  Fact Sheet for Healthcare Providers:  https://www.young.biz/  This test is no t yet approved or cleared by the [place] FDA and  has been authorized for detection and/or diagnosis of SARS-CoV-2 by FDA under an Emergency Use Authorization (EUA). This EUA will remain  in effect (meaning this test can be used) for the duration of the COVID-19 declaration under Section 564(b)(1) of the Act, 21 U.S.C. section 360bbb-3(b)(1), unless the authorization is terminated or revoked sooner.     Influenza A by PCR NEGATIVE NEGATIVE Final   Influenza B by PCR NEGATIVE NEGATIVE Final    Comment: (NOTE) The Xpert Xpress SARS-CoV-2/FLU/RSV assay is intended as an aid in  the diagnosis of influenza from Nasopharyngeal swab specimens and  should not be used  as a sole basis for treatment. Nasal washings and  aspirates are unacceptable for Xpert Xpress SARS-CoV-2/FLU/RSV  testing.  Fact Sheet for Patients: https://www.moore.com/  Fact Sheet for Healthcare Providers: https://www.young.biz/  This test is not yet approved or cleared by the [place] FDA and  has been authorized for detection and/or diagnosis of SARS-CoV-2 by  FDA under an Emergency Use Authorization (EUA). This EUA will remain  in effect (meaning this test can be used) for the duration of the  Covid-19 declaration under Section 564(b)(1) of the Act, 21  U.S.C. section 360bbb-3(b)(1), unless the authorization is  terminated or revoked. Performed at [facility], [address] [place], [place] [address]          Radiology Studies: No results found.      Scheduled Meds: . amLODipine  5 mg Oral Daily  . atorvastatin  10 mg Oral Daily  . bacitracin-polymyxin b   Right Eye TID  . clopidogrel  75 mg Oral Daily  . enoxaparin (LOVENOX) injection  30 mg Subcutaneous Q24H  . feeding supplement  237 mL Oral BID BM  . influenza vaccine adjuvanted  0.5 mL Intramuscular Tomorrow-1000  . irbesartan  150 mg Oral Daily  . melatonin  3 mg Oral QHS  . metoprolol tartrate  25 mg Oral BID  . multivitamin with minerals  1 tablet Oral Q1200  . senna-docusate  1 tablet Oral QHS  . sodium chloride  1 g Oral BID WC   Continuous Infusions: . sodium chloride 75 mL/hr at 08/01/20 1036     LOS: 7 days   Time spent= 35 mins    Madix Blowe Joline Maxcy, MD [facility]  If 7PM-7AM, please contact night-coverage  08/01/2020, 11:41 AM

## 2020-08-02 DIAGNOSIS — E43 Unspecified severe protein-calorie malnutrition: Secondary | ICD-10-CM | POA: Insufficient documentation

## 2020-08-02 DIAGNOSIS — R531 Weakness: Secondary | ICD-10-CM | POA: Diagnosis not present

## 2020-08-02 LAB — BASIC METABOLIC PANEL
Anion gap: 10 (ref 5–15)
Anion gap: 10 (ref 5–15)
BUN: 14 mg/dL (ref 8–23)
BUN: 13 mg/dL (ref 8–23)
CO2: 22 mmol/L (ref 22–32)
CO2: 21 mmol/L — ABNORMAL LOW (ref 22–32)
Calcium: 9.2 mg/dL (ref 8.9–10.3)
Calcium: 9.2 mg/dL (ref 8.9–10.3)
Chloride: 90 mmol/L — ABNORMAL LOW (ref 98–111)
Chloride: 92 mmol/L — ABNORMAL LOW (ref 98–111)
Creatinine, Ser: 0.6 mg/dL (ref 0.44–1.00)
Creatinine, Ser: 0.5 mg/dL (ref 0.44–1.00)
GFR, Estimated: >60 mL/min (ref >60)
GFR, Estimated: >60 mL/min (ref >60)
Glucose, Bld: 179 mg/dL — ABNORMAL HIGH (ref 70–99)
Glucose, Bld: 149 mg/dL — ABNORMAL HIGH (ref 70–99)
Potassium: 4.4 mmol/L (ref 3.5–5.1)
Potassium: 4.3 mmol/L (ref 3.5–5.1)
Sodium: 122 mmol/L — ABNORMAL LOW (ref 135–145)
Sodium: 123 mmol/L — ABNORMAL LOW (ref 135–145)

## 2020-08-02 MED ORDER — SODIUM CHLORIDE 1 G PO TABS
1.0000 g | ORAL_TABLET | Freq: Three times a day (TID) | ORAL | Status: DC
  Administered 2020-08-02 – 2020-08-06 (×14): 1 g via ORAL
  Filled 2020-08-02 (×16): qty 1

## 2020-08-02 MED ORDER — SODIUM CHLORIDE 0.9 % IV SOLN: INTRAVENOUS | Status: AC

## 2020-08-02 NOTE — Plan of Care (Signed)
  Problem: Education: Goal: Knowledge of General Education information will improve Description: Including pain rating scale, medication(s)/side effects and non-pharmacologic comfort measures Outcome: Progressing   Problem: Health Behavior/Discharge Planning: Goal: Ability to manage health-related needs will improve Outcome: Progressing   Problem: Clinical Measurements: Goal: Ability to maintain clinical measurements within normal limits will improve Outcome: Progressing Goal: Will remain free from infection Outcome: Progressing Goal: Diagnostic test results will improve Outcome: Progressing Goal: Respiratory complications will improve Outcome: Progressing Goal: Cardiovascular complication will be avoided Outcome: Progressing   Problem: Activity: Goal: Risk for activity intolerance will decrease Outcome: Progressing   Problem: Nutrition: Goal: Adequate nutrition will be maintained Outcome: Progressing   Problem: Coping: Goal: Level of anxiety will decrease Outcome: Progressing   Problem: Elimination: Goal: Will not experience complications related to bowel motility Outcome: Progressing Goal: Will not experience complications related to urinary retention Outcome: Progressing   Problem: Pain Managment: Goal: General experience of comfort will improve Outcome: Progressing   Problem: Safety: Goal: Ability to remain free from injury will improve Outcome: Progressing   Problem: Skin Integrity: Goal: Risk for impaired skin integrity will decrease Outcome: Progressing   Problem: Education: Goal: Knowledge of disease or condition will improve Outcome: Progressing Goal: Knowledge of secondary prevention will improve Outcome: Progressing Goal: Knowledge of patient specific risk factors addressed and post discharge goals established will improve Outcome: Progressing Goal: Individualized Educational Video(s) Outcome: Progressing   Problem: Coping: Goal: Will verbalize  positive feelings about self Outcome: Progressing   

## 2020-08-02 NOTE — Progress Notes (Signed)
PROGRESS NOTE    Bonnie Morton  ZOX:[id number] DOB: 03/10/1928 DOA: 07/25/2020 PCP: Andi DevonShelton, Kimberly, MD   Brief Narrative:  86 year old with history of CAD status post stent, HTN, hyperlipidemia, osteoporosis, GERD admitted for generalized weakness poor mobility and poor oral intake.  Admission for severe dehydration complicated by hyponatremia.  PT recommended SNF.   Assessment & Plan:   Principal Problem:   Generalized weakness Active Problems:   Essential hypertension   CAD (coronary artery disease)   Dehydration   Hyperkalemia   Progressive generalized weakness Falls Orthostatic hypotension -This is improved with hydration.  PT recommended SNF therefore arrangements being made  Acute hyponatremia, very slow to improve -Likely from hypovolemia and total body depletion as she has been following low-salt diet.  Continue normal saline 75 cc for next 24 hours, increase sodium chloride 1 g to 3 times daily.  Change low salt to regular diet  AKI -Resolved with hydration  Hypercalcemia -Resolved with hydration  History of essential hypertension Orthostatic hypotension -Currently blood pressure is controlled on metoprolol, valsartan and amlodipine.  -HCTZ remains on hold because of hyponatremia, AKI and orthostatic hypotension.  Elevated troponin CAD s/p stent angioplasty No chest pain continue home meds  Right eye infection -On admission, complaint of crusting and foreign body sensation.  -Culture grew coag negative staph aureus, currently improving on Polysporins ointment.    -Underweight -Nutrition consulted.  Constipation -Bowel regimen  DVT prophylaxis: Lovenox Code Status: DNR Family Communication:  Spoke with Mellanie yesterday  Status is: Inpatient  Remains inpatient appropriate because:Inpatient level of care appropriate due to severity of illness   Dispo: The patient is from: Home              Anticipated d/c is to: SNF               Anticipated d/c date is: 1-2 days              Patient currently is not medically stable to d/c. Still hyponatermia, needs IVF.     Body mass index is 17.54 kg/m.     Subjective: Feels okay no complaints overnight and this morning.  Overall feeling well but she understands her sodium needs to improve furthermore before she can be discharged.    Examination: Constitutional: Not in acute distress Respiratory: Clear to auscultation bilaterally Cardiovascular: Normal sinus rhythm, no rubs Abdomen: Nontender nondistended good bowel sounds Musculoskeletal: No edema noted Skin: No rashes seen Neurologic: CN 2-12 grossly intact.  And nonfocal Psychiatric: Normal judgment and insight. Alert and oriented x 3. Normal mood.  Objective: Vitals:   08/01/20 2019 08/01/20 2056 08/02/20 0418 08/02/20 0725  BP:  125/61  126/75  Pulse:  (!) 54  64  Resp:  18  14  Temp: 98.6 F (37 C) 98.6 F (37 C) 98.9 F (37.2 C) 98.6 F (37 C)  TempSrc: Oral Oral Oral Oral  SpO2:  96%  96%  Weight:      Height:        Intake/Output Summary (Last 24 hours) at 08/02/2020 0851 Last data filed at 08/01/2020 1800 Gross per 24 hour  Intake 766.8 ml  Output 1000 ml  Net -233.2 ml   Filed Weights   07/25/20 1230  Weight: 44.9 kg     Data Reviewed:   CBC: Recent Labs  Lab 07/29/20 0204 07/31/20 0322  WBC 8.2 8.8  NEUTROABS 6.4 6.6  HGB 10.5* 10.9*  HCT 32.4* 32.7*  MCV 94.2 93.4  PLT 282 274  Basic Metabolic Panel: Recent Labs  Lab 07/29/20 0204 07/31/20 0322 07/31/20 1114 07/31/20 1418 08/01/20 0443 08/01/20 1235 08/01/20 2108 08/02/20 0443  NA 131*   < > 121*  --  120* 121* 120* 122*  K 4.0   < > 4.6  --  4.6 4.7 4.8 4.4  CL 100   < > 87*  --  87* 87* 90* 90*  CO2 23   < > 23  --  24 23 22 22   GLUCOSE 176*   < > 218*  --  197* 165* 133* 179*  BUN 12   < > 13  --  17 14 12 14   CREATININE 0.68   < > 0.70  --  0.79 0.63 0.62 0.60  CALCIUM 9.8   < > 9.6  --  9.4 9.6 9.2  9.2  MG 1.6*  --   --  1.4* 2.5*  --   --   --   PHOS 2.5  --   --   --  2.3*  --   --   --    < > = values in this interval not displayed.   GFR: Estimated Creatinine Clearance: 33.1 mL/min (by C-G formula based on SCr of 0.6 mg/dL). Liver Function Tests: No results for input(s): AST, ALT, ALKPHOS, BILITOT, PROT, ALBUMIN in the last 168 hours. No results for input(s): LIPASE, AMYLASE in the last 168 hours. No results for input(s): AMMONIA in the last 168 hours. Coagulation Profile: No results for input(s): INR, PROTIME in the last 168 hours. Cardiac Enzymes: No results for input(s): CKTOTAL, CKMB, CKMBINDEX, TROPONINI in the last 168 hours. BNP (last 3 results) No results for input(s): PROBNP in the last 8760 hours. HbA1C: No results for input(s): HGBA1C in the last 72 hours. CBG: Recent Labs  Lab 07/28/20 2025  GLUCAP 192*   Lipid Profile: No results for input(s): CHOL, HDL, LDLCALC, TRIG, CHOLHDL, LDLDIRECT in the last 72 hours. Thyroid Function Tests: No results for input(s): TSH, T4TOTAL, FREET4, T3FREE, THYROIDAB in the last 72 hours. Anemia Panel: No results for input(s): VITAMINB12, FOLATE, FERRITIN, TIBC, IRON, RETICCTPCT in the last 72 hours. Sepsis Labs: No results for input(s): PROCALCITON, LATICACIDVEN in the last 168 hours.  Recent Results (from the past 240 hour(s))  Eye culture     Status: None   Collection Time: 07/25/20 12:13 PM   Specimen: Eye  Result Value Ref Range Status   Specimen Description EYE  Final   Special Requests Normal  Final   Gram Stain   Final    ABUNDANT WBC PRESENT, PREDOMINANTLY PMN RARE GRAM POSITIVE COCCI Performed at [facility], [address], [place], [address] [place]    Culture MODERATE STAPHYLOCOCCUS EPIDERMIDIS  Final   Report Status 07/27/2020 FINAL  Final   Organism ID, Bacteria STAPHYLOCOCCUS EPIDERMIDIS  Final      Susceptibility   Staphylococcus epidermidis - MIC*    CIPROFLOXACIN <=0.5 SENSITIVE Sensitive      ERYTHROMYCIN >=8 RESISTANT Resistant     GENTAMICIN <=0.5 SENSITIVE Sensitive     OXACILLIN <=0.25 SENSITIVE Sensitive     TETRACYCLINE <=1 SENSITIVE Sensitive     VANCOMYCIN 2 SENSITIVE Sensitive     TRIMETH/SULFA 80 RESISTANT Resistant     CLINDAMYCIN <=0.25 SENSITIVE Sensitive     RIFAMPIN <=0.5 SENSITIVE Sensitive     Inducible Clindamycin NEGATIVE Sensitive     * MODERATE STAPHYLOCOCCUS EPIDERMIDIS  Respiratory Panel by RT PCR (Flu A&B, Covid) - Nasopharyngeal Swab  Status: None   Collection Time: 07/25/20  3:43 PM   Specimen: Nasopharyngeal Swab  Result Value Ref Range Status   SARS Coronavirus 2 by RT PCR NEGATIVE NEGATIVE Final    Comment: (NOTE) SARS-CoV-2 target nucleic acids are NOT DETECTED.  The SARS-CoV-2 RNA is generally detectable in upper respiratoy specimens during the acute phase of infection. The lowest concentration of SARS-CoV-2 viral copies this assay can detect is 131 copies/mL. A negative result does not preclude SARS-Cov-2 infection and should not be used as the sole basis for treatment or other patient management decisions. A negative result may occur with  improper specimen collection/handling, submission of specimen other than nasopharyngeal swab, presence of viral mutation(s) within the areas targeted by this assay, and inadequate number of viral copies (<131 copies/mL). A negative result must be combined with clinical observations, patient history, and epidemiological information. The expected result is Negative.  Fact Sheet for Patients:  https://www.moore.com/  Fact Sheet for Healthcare Providers:  https://www.young.biz/  This test is no t yet approved or cleared by the [place] FDA and  has been authorized for detection and/or diagnosis of SARS-CoV-2 by FDA under an Emergency Use Authorization (EUA). This EUA will remain  in effect (meaning this test can be used) for the duration of  the COVID-19 declaration under Section 564(b)(1) of the Act, 21 U.S.C. section 360bbb-3(b)(1), unless the authorization is terminated or revoked sooner.     Influenza A by PCR NEGATIVE NEGATIVE Final   Influenza B by PCR NEGATIVE NEGATIVE Final    Comment: (NOTE) The Xpert Xpress SARS-CoV-2/FLU/RSV assay is intended as an aid in  the diagnosis of influenza from Nasopharyngeal swab specimens and  should not be used as a sole basis for treatment. Nasal washings and  aspirates are unacceptable for Xpert Xpress SARS-CoV-2/FLU/RSV  testing.  Fact Sheet for Patients: https://www.moore.com/  Fact Sheet for Healthcare Providers: https://www.young.biz/  This test is not yet approved or cleared by the [place] FDA and  has been authorized for detection and/or diagnosis of SARS-CoV-2 by  FDA under an Emergency Use Authorization (EUA). This EUA will remain  in effect (meaning this test can be used) for the duration of the  Covid-19 declaration under Section 564(b)(1) of the Act, 21  U.S.C. section 360bbb-3(b)(1), unless the authorization is  terminated or revoked. Performed at [facility], [address] [place], [place] [address]   Respiratory Panel by RT PCR (Flu A&B, Covid) - Nasopharyngeal Swab     Status: None   Collection Time: 07/30/20  3:33 PM   Specimen: Nasopharyngeal Swab; Nasopharyngeal(NP) swabs in vial transport medium  Result Value Ref Range Status   SARS Coronavirus 2 by RT PCR NEGATIVE NEGATIVE Final    Comment: (NOTE) SARS-CoV-2 target nucleic acids are NOT DETECTED.  The SARS-CoV-2 RNA is generally detectable in upper respiratoy specimens during the acute phase of infection. The lowest concentration of SARS-CoV-2 viral copies this assay can detect is 131 copies/mL. A negative result does not preclude SARS-Cov-2 infection and should not be used as the sole basis for treatment or other patient management decisions. A  negative result may occur with  improper specimen collection/handling, submission of specimen other than nasopharyngeal swab, presence of viral mutation(s) within the areas targeted by this assay, and inadequate number of viral copies (<131 copies/mL). A negative result must be combined with clinical observations, patient history, and epidemiological information. The expected result is Negative.  Fact Sheet for Patients:  https://www.moore.com/  Fact Sheet  for Healthcare Providers:  https://www.young.biz/  This test is no t yet approved or cleared by the [place] and  has been authorized for detection and/or diagnosis of SARS-CoV-2 by FDA under an Emergency Use Authorization (EUA). This EUA will remain  in effect (meaning this test can be used) for the duration of the COVID-19 declaration under Section 564(b)(1) of the Act, 21 U.S.C. section 360bbb-3(b)(1), unless the authorization is terminated or revoked sooner.     Influenza A by PCR NEGATIVE NEGATIVE Final   Influenza B by PCR NEGATIVE NEGATIVE Final    Comment: (NOTE) The Xpert Xpress SARS-CoV-2/FLU/RSV assay is intended as an aid in  the diagnosis of influenza from Nasopharyngeal swab specimens and  should not be used as a sole basis for treatment. Nasal washings and  aspirates are unacceptable for Xpert Xpress SARS-CoV-2/FLU/RSV  testing.  Fact Sheet for Patients: https://www.moore.com/  Fact Sheet for Healthcare Providers: https://www.young.biz/  This test is not yet approved or cleared by the [place] FDA and  has been authorized for detection and/or diagnosis of SARS-CoV-2 by  FDA under an Emergency Use Authorization (EUA). This EUA will remain  in effect (meaning this test can be used) for the duration of the  Covid-19 declaration under Section 564(b)(1) of the Act, 21  U.S.C. section 360bbb-3(b)(1), unless the authorization  is  terminated or revoked. Performed at [facility], [address] [place], [place] [address]          Radiology Studies: No results found.      Scheduled Meds: . amLODipine  5 mg Oral Daily  . atorvastatin  10 mg Oral Daily  . bacitracin-polymyxin b   Right Eye TID  . clopidogrel  75 mg Oral Daily  . enoxaparin (LOVENOX) injection  30 mg Subcutaneous Q24H  . feeding supplement  237 mL Oral BID BM  . influenza vaccine adjuvanted  0.5 mL Intramuscular Tomorrow-1000  . irbesartan  150 mg Oral Daily  . melatonin  3 mg Oral QHS  . metoprolol tartrate  25 mg Oral BID  . multivitamin with minerals  1 tablet Oral Q1200  . senna-docusate  1 tablet Oral QHS  . sodium chloride  1 g Oral TID WC   Continuous Infusions: . sodium chloride       LOS: 8 days   Time spent= 35 mins    Charrie Mcconnon Joline Maxcy, MD [facility]  If 7PM-7AM, please contact night-coverage  08/02/2020, 8:51 AM

## 2020-08-03 DIAGNOSIS — R531 Weakness: Secondary | ICD-10-CM | POA: Diagnosis not present

## 2020-08-03 LAB — BASIC METABOLIC PANEL
Anion gap: 10 (ref 5–15)
Anion gap: 10 (ref 5–15)
Anion gap: 9 (ref 5–15)
Anion gap: 10 (ref 5–15)
BUN: 10 mg/dL (ref 8–23)
BUN: 10 mg/dL (ref 8–23)
BUN: 9 mg/dL (ref 8–23)
BUN: 10 mg/dL (ref 8–23)
CO2: 23 mmol/L (ref 22–32)
CO2: 21 mmol/L — ABNORMAL LOW (ref 22–32)
CO2: 21 mmol/L — ABNORMAL LOW (ref 22–32)
CO2: 21 mmol/L — ABNORMAL LOW (ref 22–32)
Calcium: 9.3 mg/dL (ref 8.9–10.3)
Calcium: 9.3 mg/dL (ref 8.9–10.3)
Calcium: 9.4 mg/dL (ref 8.9–10.3)
Calcium: 9.5 mg/dL (ref 8.9–10.3)
Chloride: 92 mmol/L — ABNORMAL LOW (ref 98–111)
Chloride: 93 mmol/L — ABNORMAL LOW (ref 98–111)
Chloride: 93 mmol/L — ABNORMAL LOW (ref 98–111)
Chloride: 94 mmol/L — ABNORMAL LOW (ref 98–111)
Creatinine, Ser: 0.6 mg/dL (ref 0.44–1.00)
Creatinine, Ser: 0.59 mg/dL (ref 0.44–1.00)
Creatinine, Ser: 0.57 mg/dL (ref 0.44–1.00)
Creatinine, Ser: 0.57 mg/dL (ref 0.44–1.00)
GFR, Estimated: >60 mL/min (ref >60)
GFR, Estimated: >60 mL/min (ref >60)
GFR, Estimated: >60 mL/min (ref >60)
GFR, Estimated: >60 mL/min (ref >60)
Glucose, Bld: 157 mg/dL — ABNORMAL HIGH (ref 70–99)
Glucose, Bld: 130 mg/dL — ABNORMAL HIGH (ref 70–99)
Glucose, Bld: 154 mg/dL — ABNORMAL HIGH (ref 70–99)
Glucose, Bld: 141 mg/dL — ABNORMAL HIGH (ref 70–99)
Potassium: 4.3 mmol/L (ref 3.5–5.1)
Potassium: 4.3 mmol/L (ref 3.5–5.1)
Potassium: 4.3 mmol/L (ref 3.5–5.1)
Potassium: 4.1 mmol/L (ref 3.5–5.1)
Sodium: 125 mmol/L — ABNORMAL LOW (ref 135–145)
Sodium: 124 mmol/L — ABNORMAL LOW (ref 135–145)
Sodium: 123 mmol/L — ABNORMAL LOW (ref 135–145)
Sodium: 125 mmol/L — ABNORMAL LOW (ref 135–145)

## 2020-08-03 MED ORDER — BACITRACIN-POLYMYXIN B 500-10000 UNIT/GM OP OINT: TOPICAL_OINTMENT | Freq: Three times a day (TID) | OPHTHALMIC | 0 refills | Status: AC

## 2020-08-03 MED ORDER — SENNOSIDES-DOCUSATE SODIUM 8.6-50 MG PO TABS: 1.0000 | ORAL_TABLET | Freq: Every day | ORAL | Status: AC

## 2020-08-03 MED ORDER — SODIUM CHLORIDE 1 G PO TABS: 1.0000 g | ORAL_TABLET | Freq: Three times a day (TID) | ORAL | Status: AC

## 2020-08-03 MED ORDER — POLYETHYLENE GLYCOL 3350 17 G PO PACK: 17.0000 g | PACK | Freq: Every day | ORAL | 0 refills | Status: AC | PRN

## 2020-08-03 NOTE — Discharge Summary (Addendum)
Physician Discharge Summary  Bonnie Morton ZOX:[id number] DOB: 02-01-1928 DOA: 07/25/2020  PCP: Andi Devon, MD  Admit date: 07/25/2020 Discharge date: 08/06/2020  Admitted From: Home Disposition: SNF  Recommendations for Outpatient Follow-up:  1. Follow up with PCP in 1-2 weeks 2. Repeat BMP in 2 days, 08/05/2020.  Manage sodium levels as appropriate 3. Continue salt tablets 3 times daily for another 4-5 days until sodium levels are greater than 130.   Discharge Condition: Stable CODE STATUS: DNR Diet recommendation: Regular diet for now, over time she can transition to low-salt diet  Brief/Interim Summary: 86 year old with history of CAD status post stent, HTN, hyperlipidemia, osteoporosis, GERD admitted for generalized weakness poor mobility and poor oral intake.  Admission for severe dehydration complicated by hyponatremia which was likely from total body sodium depletion.  Hydrochlorothiazide has been discontinued, ARB can be resumed as appropriate.  PT recommended SNF therefore arrangements made.   Assessment & Plan:   Principal Problem:   Generalized weakness Active Problems:   Essential hypertension   CAD (coronary artery disease)   Dehydration   Hyperkalemia   Progressive generalized weakness Falls Orthostatic hypotension -This is improved with hydration.  PT recommended SNF therefore arrangements being made  Acute hyponatremia, very slow to improve -Likely from total body salt depletion.  Currently on salt tablets 1 g 3 times daily, regular diet.  Repeat BMP on 08/05/2020.  Further adjust as necessary and discontinue if sodium levels have normalized. -Also have discontinued hydrochlorothiazide, ARB it can be resumed as appropriate depending on her blood pressure or increase the dose of Norvasc.  AKI -Resolved with hydration  Hypercalcemia -Resolved with hydration  History of essentialhypertension Orthostatic hypotension -Resume home meds but  discontinue hydrochlorothiazide in the setting of hyponatremia.  If blood pressure continues to remain elevated, resume ARB as appropriate.  Would recommend valsartan 160 mg daily.  Elevated troponin CAD s/p stent angioplasty No chest pain continue home meds  Right eye infection -On admission, complaint of crusting and foreign body sensation.  -Culture grew coag negative staph aureus, currently improving on Polysporins ointment.   -Underweight -Nutrition consulted.  Constipation -Bowel regimen, needs to have 1 daily soft bowel movement.  Out of bed to chair.  Body mass index is 17.54 kg/m.    Discharge Diagnoses:  Principal Problem:   Generalized weakness Active Problems:   Essential hypertension   CAD (coronary artery disease)   Dehydration   Hyperkalemia   Protein-calorie malnutrition, severe     Subjective: Feels great no complaints  Discharge Exam: Vitals:   08/06/20 0337 08/06/20 0801  BP: (!) 152/83 (!) 166/85  Pulse: 95 (!) 50  Resp: 18 14  Temp: 98.2 F (36.8 C) 97.9 F (36.6 C)  SpO2: 96%    Vitals:   08/05/20 2047 08/05/20 2329 08/06/20 0337 08/06/20 0801  BP: 136/68 (!) 141/87 (!) 152/83 (!) 166/85  Pulse: 64 (!) 101 95 (!) 50  Resp: Temp: 98.8 F (37.1 C) 98.5 F (36.9 C) 98.2 F (36.8 C) 97.9 F (36.6 C)  TempSrc: Oral Oral Oral Oral  SpO2: 97% 95% 96%   Weight:      Height:        General: Pt is alert, awake, not in acute distress Cardiovascular: RRR, S1/S2 +, no rubs, no gallops Respiratory: CTA bilaterally, no wheezing, no rhonchi Abdominal: Soft, NT, ND, bowel sounds + Extremities: no edema, no cyanosis  Discharge Instructions   Allergies as of 08/06/2020   No Known Allergies  Medication List    STOP taking these medications   valsartan-hydrochlorothiazide 160-25 MG tablet Commonly known as: DIOVAN-HCT     TAKE these medications   acetaminophen 500 MG tablet Commonly known as: TYLENOL Take  1,000 mg by mouth as needed for mild pain or headache.   amLODipine 5 MG tablet Commonly known as: NORVASC Take 5 mg by mouth daily.   atorvastatin 10 MG tablet Commonly known as: LIPITOR Take 10 mg by mouth daily.   bacitracin-polymyxin b ophthalmic ointment Commonly known as: POLYSPORIN Place into the right eye 3 (three) times daily for 3 days. apply to eye every 12 hours while awake   bisacodyl 10 MG suppository Commonly known as: DULCOLAX Place 1 suppository (10 mg total) rectally daily as needed for moderate constipation or severe constipation.   Centrum Silver Adult 50+ Tabs Take 1 tablet by mouth daily at 12 noon.   clopidogrel 75 MG tablet Commonly known as: PLAVIX Take 1 tablet (75 mg total) by mouth daily.   diclofenac Sodium 1 % Gel Commonly known as: VOLTAREN Apply 2 g topically 4 (four) times daily.   metoprolol tartrate 25 MG tablet Commonly known as: LOPRESSOR Take 1 tablet (25 mg total) by mouth 2 (two) times daily.   polyethylene glycol 17 g packet Commonly known as: MIRALAX / GLYCOLAX Take 17 g by mouth daily as needed for moderate constipation.   senna-docusate 8.6-50 MG tablet Commonly known as: Senokot-S Take 1 tablet by mouth at bedtime.   sodium chloride 1 g tablet Take 1 tablet (1 g total) by mouth 3 (three) times daily with meals for 4 days.       Contact information for follow-up providers    Andi Devon, MD. Schedule an appointment as soon as possible for a visit in 1 week(s).   Specialty: Internal Medicine Contact information: [address] [place] [address] [phone]        Lars Masson, MD .   Specialty: Cardiology Contact information: [address] [place] [address] [phone]            Contact information for after-discharge care    Destination    [facility] .   Service: Skilled Nursing Contact information: [address] [place]  [place] [address] [phone]                 No Known Allergies  You were cared for by a hospitalist during your hospital stay. If you have any questions about your discharge medications or the care you received while you were in the hospital after you are discharged, you can call the unit and asked to speak with the hospitalist on call if the hospitalist that took care of you is not available. Once you are discharged, your primary care physician will handle any further medical issues. Please note that no refills for any discharge medications will be authorized once you are discharged, as it is imperative that you return to your primary care physician (or establish a relationship with a primary care physician if you do not have one) for your aftercare needs so that they can reassess your need for medications and monitor your lab values.   Procedures/Studies: DG Chest Port 1 View  Result Date: 07/25/2020 CLINICAL DATA:  Generalized weakness. Additional provided: Dizziness and nausea since yesterday afternoon. EXAM: PORTABLE CHEST 1 VIEW COMPARISON:  Prior chest radiographs 04/04/2020 and earlier. FINDINGS: Unchanged cardiomediastinal contours. Mild cardiomegaly. Aortic atherosclerosis. No appreciable airspace consolidation  or pulmonary edema. No evidence of pleural effusion or pneumothorax. No acute bony abnormality identified. IMPRESSION: No evidence of acute cardiopulmonary abnormality. Mild cardiomegaly, unchanged. Aortic Atherosclerosis (ICD10-I70.0). Electronically Signed   By: Jackey LogeKyle  Golden DO   On: 07/25/2020 12:52     The results of significant diagnostics from this hospitalization (including imaging, microbiology, ancillary and laboratory) are listed below for reference.     Microbiology: Recent Results (from the past 240 hour(s))  Respiratory Panel by RT PCR (Flu A&B, Covid) - Nasopharyngeal Swab     Status: None   Collection Time: 07/30/20  3:33 PM   Specimen: Nasopharyngeal  Swab; Nasopharyngeal(NP) swabs in vial transport medium  Result Value Ref Range Status   SARS Coronavirus 2 by RT PCR NEGATIVE NEGATIVE Final    Comment: (NOTE) SARS-CoV-2 target nucleic acids are NOT DETECTED.  The SARS-CoV-2 RNA is generally detectable in upper respiratoy specimens during the acute phase of infection. The lowest concentration of SARS-CoV-2 viral copies this assay can detect is 131 copies/mL. A negative result does not preclude SARS-Cov-2 infection and should not be used as the sole basis for treatment or other patient management decisions. A negative result may occur with  improper specimen collection/handling, submission of specimen other than nasopharyngeal swab, presence of viral mutation(s) within the areas targeted by this assay, and inadequate number of viral copies (<131 copies/mL). A negative result must be combined with clinical observations, patient history, and epidemiological information. The expected result is Negative.  Fact Sheet for Patients:  https://www.moore.com/https://www.fda.gov/media/142436/download  Fact Sheet for Healthcare Providers:  https://www.young.biz/https://www.fda.gov/media/142435/download  This test is no t yet approved or cleared by the [place] FDA and  has been authorized for detection and/or diagnosis of SARS-CoV-2 by FDA under an Emergency Use Authorization (EUA). This EUA will remain  in effect (meaning this test can be used) for the duration of the COVID-19 declaration under Section 564(b)(1) of the Act, 21 U.S.C. section 360bbb-3(b)(1), unless the authorization is terminated or revoked sooner.     Influenza A by PCR NEGATIVE NEGATIVE Final   Influenza B by PCR NEGATIVE NEGATIVE Final    Comment: (NOTE) The Xpert Xpress SARS-CoV-2/FLU/RSV assay is intended as an aid in  the diagnosis of influenza from Nasopharyngeal swab specimens and  should not be used as a sole basis for treatment. Nasal washings and  aspirates are unacceptable for Xpert Xpress  SARS-CoV-2/FLU/RSV  testing.  Fact Sheet for Patients: https://www.moore.com/https://www.fda.gov/media/142436/download  Fact Sheet for Healthcare Providers: https://www.young.biz/https://www.fda.gov/media/142435/download  This test is not yet approved or cleared by the [place] FDA and  has been authorized for detection and/or diagnosis of SARS-CoV-2 by  FDA under an Emergency Use Authorization (EUA). This EUA will remain  in effect (meaning this test can be used) for the duration of the  Covid-19 declaration under Section 564(b)(1) of the Act, 21  U.S.C. section 360bbb-3(b)(1), unless the authorization is  terminated or revoked. Performed at [facility], [address] [place], [place] [id number]      Labs: BNP (last 3 results) No results for input(s): BNP in the last 8760 hours. Basic Metabolic Panel: Recent Labs  Lab 07/31/20 1114 07/31/20 1418 08/01/20 0443 08/01/20 1235 08/04/20 2108 08/05/20 0324 08/05/20 1351 08/05/20 2057 08/06/20 0507  NA   < >  --  120*   < > 125* 125* 128* 128* 128*  K   < >  --  4.6   < > 3.8 3.9 3.9 3.6 3.9  CL   < >  --  87*   < > 96* 96* 98 97* 97*  CO2   < >  --  24   < > 21* 21* 20* 20* 20*  GLUCOSE   < >  --  197*   < > 244* 168* 108* 111* 108*  BUN   < >  --  17   < > 10 10 7* 6* 6*  CREATININE   < >  --  0.79   < > 0.61 0.62 0.60 0.53 0.48  CALCIUM   < >  --  9.4   < > 8.9 8.9 9.1 8.9 9.3  MG  --  1.4* 2.5*  --   --   --   --   --   --   PHOS  --   --  2.3*  --   --   --   --   --   --    < > = values in this interval not displayed.   Liver Function Tests: No results for input(s): AST, ALT, ALKPHOS, BILITOT, PROT, ALBUMIN in the last 168 hours. No results for input(s): LIPASE, AMYLASE in the last 168 hours. No results for input(s): AMMONIA in the last 168 hours. CBC: Recent Labs  Lab 07/31/20 0322  WBC 8.8  NEUTROABS 6.6  HGB 10.9*  HCT 32.7*  MCV 93.4  PLT 274   Cardiac Enzymes: No results for input(s): CKTOTAL, CKMB, CKMBINDEX, TROPONINI in the  last 168 hours. BNP: Invalid input(s): POCBNP CBG: No results for input(s): GLUCAP in the last 168 hours. D-Dimer No results for input(s): DDIMER in the last 72 hours. Hgb A1c No results for input(s): HGBA1C in the last 72 hours. Lipid Profile No results for input(s): CHOL, HDL, LDLCALC, TRIG, CHOLHDL, LDLDIRECT in the last 72 hours. Thyroid function studies No results for input(s): TSH, T4TOTAL, T3FREE, THYROIDAB in the last 72 hours.  Invalid input(s): FREET3 Anemia work up No results for input(s): VITAMINB12, FOLATE, FERRITIN, TIBC, IRON, RETICCTPCT in the last 72 hours. Urinalysis    Component Value Date/Time   COLORURINE YELLOW 07/25/2020 1425   APPEARANCEUR CLEAR 07/25/2020 1425   LABSPEC 1.015 07/25/2020 1425   PHURINE 5.0 07/25/2020 1425   GLUCOSEU NEGATIVE 07/25/2020 1425   HGBUR NEGATIVE 07/25/2020 1425   BILIRUBINUR NEGATIVE 07/25/2020 1425   KETONESUR 5 (A) 07/25/2020 1425   PROTEINUR 30 (A) 07/25/2020 1425   UROBILINOGEN 0.2 08/02/2011 1420   NITRITE NEGATIVE 07/25/2020 1425   LEUKOCYTESUR NEGATIVE 07/25/2020 1425   Sepsis Labs Invalid input(s): PROCALCITONIN,  WBC,  LACTICIDVEN Microbiology Recent Results (from the past 240 hour(s))  Respiratory Panel by RT PCR (Flu A&B, Covid) - Nasopharyngeal Swab     Status: None   Collection Time: 07/30/20  3:33 PM   Specimen: Nasopharyngeal Swab; Nasopharyngeal(NP) swabs in vial transport medium  Result Value Ref Range Status   SARS Coronavirus 2 by RT PCR NEGATIVE NEGATIVE Final    Comment: (NOTE) SARS-CoV-2 target nucleic acids are NOT DETECTED.  The SARS-CoV-2 RNA is generally detectable in upper respiratoy specimens during the acute phase of infection. The lowest concentration of SARS-CoV-2 viral copies this assay can detect is 131 copies/mL. A negative result does not preclude SARS-Cov-2 infection and should not be used as the sole basis for treatment or other patient management decisions. A negative result  may occur with  improper specimen collection/handling, submission of specimen other than nasopharyngeal swab, presence of viral mutation(s) within the areas targeted by this assay, and inadequate number of viral  copies (<131 copies/mL). A negative result must be combined with clinical observations, patient history, and epidemiological information. The expected result is Negative.  Fact Sheet for Patients:  https://www.moore.com/  Fact Sheet for Healthcare Providers:  https://www.young.biz/  This test is no t yet approved or cleared by the [place] FDA and  has been authorized for detection and/or diagnosis of SARS-CoV-2 by FDA under an Emergency Use Authorization (EUA). This EUA will remain  in effect (meaning this test can be used) for the duration of the COVID-19 declaration under Section 564(b)(1) of the Act, 21 U.S.C. section 360bbb-3(b)(1), unless the authorization is terminated or revoked sooner.     Influenza A by PCR NEGATIVE NEGATIVE Final   Influenza B by PCR NEGATIVE NEGATIVE Final    Comment: (NOTE) The Xpert Xpress SARS-CoV-2/FLU/RSV assay is intended as an aid in  the diagnosis of influenza from Nasopharyngeal swab specimens and  should not be used as a sole basis for treatment. Nasal washings and  aspirates are unacceptable for Xpert Xpress SARS-CoV-2/FLU/RSV  testing.  Fact Sheet for Patients: https://www.moore.com/  Fact Sheet for Healthcare Providers: https://www.young.biz/  This test is not yet approved or cleared by the [place] FDA and  has been authorized for detection and/or diagnosis of SARS-CoV-2 by  FDA under an Emergency Use Authorization (EUA). This EUA will remain  in effect (meaning this test can be used) for the duration of the  Covid-19 declaration under Section 564(b)(1) of the Act, 21  U.S.C. section 360bbb-3(b)(1), unless the authorization is  terminated  or revoked. Performed at [facility], [address] [place], [place] [address]      Time coordinating discharge:  I have spent 35 minutes face to face with the patient and on the ward discussing the patients care, assessment, plan and disposition with other care givers. >50% of the time was devoted counseling the patient about the risks and benefits of treatment/Discharge disposition and coordinating care.   SIGNED:   Dimple Nanas, MD  [facility] 08/06/2020, 8:31 AM   If 7PM-7AM, please contact night-coverage

## 2020-08-03 NOTE — TOC Progression Note (Signed)
Transition of Care [facility]) - Progression Note    Patient Details  Name: Bonnie Morton MRN: [id number] Date of Birth: 1927-09-30  [facility] CM/SW Contact  Carley Hammed, [place] Phone Number: 08/03/2020, 4:55 PM  Clinical Narrative:     CSW confirmed with [facility] that they could not take pt until Monday.   Expected Discharge Plan: Skilled Nursing Facility Barriers to Discharge: SNF Pending bed offer, Insurance Authorization, Continued Medical Work up  Expected Discharge Plan and Services Expected Discharge Plan: Skilled Nursing Facility     Post Acute Care Choice: Skilled Nursing Facility Living arrangements for the past 2 months: Single Family Home Expected Discharge Date: 08/03/20                                     Social Determinants of Health (SDOH) Interventions    Readmission Risk Interventions No flowsheet data found.

## 2020-08-03 NOTE — Progress Notes (Signed)
Physical Therapy Treatment Patient Details Name: Bonnie Morton MRN: [id number] DOB: 04/07/1928 Today's Date: 08/03/2020    History of Present Illness Pt is 86 yo female who presented to ED with generalized weakness. Reports progressive weakness and difficulty with mobility s/p fall 4 weeks ago with dizziness, lightheadedness, and poor appetite. Pt with PMH including coronary artery disease status post stent angioplasty, hypertension, dyslipidemia, osteoporosis and GERD.    PT Comments    Pt tolerates treatment well but continues to require significant physical assistance for all functional mobility tasks. PT presents with a strong posterior lean, leaning so far backward that RLE comes off the floor during all transfer attempts. PT provides cues to facilitate and anterior trunk lean however this is unsuccessful during transfers. Pt has limited awareness of her deficits at this time and impaired short term memory. Pt will benefit from continued acute PT POC to improve mobility quality and to reduce falls risk. PT continues to recommend SNF placement at this time.   Follow Up Recommendations  SNF     Equipment Recommendations  Wheelchair cushion (measurements PT);Wheelchair (measurements PT)    Recommendations for Other Services       Precautions / Restrictions Precautions Precautions: Fall Restrictions Weight Bearing Restrictions: No    Mobility  Bed Mobility Overal bed mobility: Needs Assistance Bed Mobility: Supine to Sit     Supine to sit: Mod assist;HOB elevated        Transfers Overall transfer level: Needs assistance Equipment used: 1 person hand held assist Transfers: Sit to/from [employer] Sit to Stand: Max assist   Squat pivot transfers: Max assist     General transfer comment: pt lifts RLE into air, LLE maintained on floor by PT knee block, strong posterior lean with initiation of pivot. This remains consistent during all transfer attempts  this session despite PT cues and demonstration  Ambulation/Gait                 Stairs             Wheelchair Mobility    Modified Rankin (Stroke Patients Only)       Balance Overall balance assessment: Needs assistance Sitting-balance support: Feet supported;Single extremity supported;Bilateral upper extremity supported Sitting balance-Leahy Scale: Poor Sitting balance - Comments: reliant on bed rail or armrest Postural control: Posterior lean Standing balance support: Bilateral upper extremity supported Standing balance-Leahy Scale: Zero Standing balance comment: max-totalA, posterior lean                            Cognition Arousal/Alertness: Awake/alert Behavior During Therapy: WFL for tasks assessed/performed Overall Cognitive Status: Impaired/Different from baseline Area of Impairment: Memory;Following commands;Safety/judgement;Awareness;Problem solving                     Memory: Decreased recall of precautions;Decreased short-term memory Following Commands: Follows one step commands consistently Safety/Judgement: Decreased awareness of safety;Decreased awareness of deficits Awareness: Intellectual Problem Solving: Slow processing;Decreased initiation;Difficulty sequencing;Requires verbal cues;Requires tactile cues        Exercises      General Comments General comments (skin integrity, edema, etc.): tachy up to 140 at times with mobility. PT denies dizziness this session      Pertinent Vitals/Pain Pain Assessment: No/denies pain    Home Living                      Prior Function  PT Goals (current goals can now be found in the care plan section) Acute Rehab PT Goals Patient Stated Goal: get stronger; walk independently Progress towards PT goals: Not progressing toward goals - comment (limited by impaired balance and cognition)    Frequency    Min 2X/week      PT Plan Current plan remains  appropriate    Co-evaluation              AM-PAC PT "6 Clicks" Mobility   Outcome Measure  Help needed turning from your back to your side while in a flat bed without using bedrails?: A Lot Help needed moving from lying on your back to sitting on the side of a flat bed without using bedrails?: A Lot Help needed moving to and from a bed to a chair (including a wheelchair)?: Total Help needed standing up from a chair using your arms (e.g., wheelchair or bedside chair)?: Total Help needed to walk in hospital room?: Total Help needed climbing 3-5 steps with a railing? : Total 6 Click Score: 8    End of Session   Activity Tolerance: Patient tolerated treatment well Patient left: in chair;with call bell/phone within reach;with chair alarm set Nurse Communication: Mobility status PT Visit Diagnosis: Unsteadiness on feet (R26.81);History of falling (Z91.81);Muscle weakness (generalized) (M62.81)     Time: 1505-6979 PT Time Calculation (min) (ACUTE ONLY): 23 min  Charges:  $Therapeutic Activity: 23-37 mins                     Arlyss Gandy, PT, DPT Acute Rehabilitation Pager: [phone]    Arlyss Gandy 08/03/2020, 10:15 AM

## 2020-08-04 LAB — BASIC METABOLIC PANEL
Anion gap: 8 (ref 5–15)
Anion gap: 11 (ref 5–15)
Anion gap: 8 (ref 5–15)
BUN: 9 mg/dL (ref 8–23)
BUN: 10 mg/dL (ref 8–23)
BUN: 10 mg/dL (ref 8–23)
CO2: 22 mmol/L (ref 22–32)
CO2: 22 mmol/L (ref 22–32)
CO2: 21 mmol/L — ABNORMAL LOW (ref 22–32)
Calcium: 9.2 mg/dL (ref 8.9–10.3)
Calcium: 9.5 mg/dL (ref 8.9–10.3)
Calcium: 8.9 mg/dL (ref 8.9–10.3)
Chloride: 94 mmol/L — ABNORMAL LOW (ref 98–111)
Chloride: 94 mmol/L — ABNORMAL LOW (ref 98–111)
Chloride: 96 mmol/L — ABNORMAL LOW (ref 98–111)
Creatinine, Ser: 0.6 mg/dL (ref 0.44–1.00)
Creatinine, Ser: 0.59 mg/dL (ref 0.44–1.00)
Creatinine, Ser: 0.61 mg/dL (ref 0.44–1.00)
GFR, Estimated: >60 mL/min (ref >60)
GFR, Estimated: >60 mL/min (ref >60)
GFR, Estimated: >60 mL/min (ref >60)
Glucose, Bld: 120 mg/dL — ABNORMAL HIGH (ref 70–99)
Glucose, Bld: 148 mg/dL — ABNORMAL HIGH (ref 70–99)
Glucose, Bld: 244 mg/dL — ABNORMAL HIGH (ref 70–99)
Potassium: 4.1 mmol/L (ref 3.5–5.1)
Potassium: 5.2 mmol/L — ABNORMAL HIGH (ref 3.5–5.1)
Potassium: 3.8 mmol/L (ref 3.5–5.1)
Sodium: 124 mmol/L — ABNORMAL LOW (ref 135–145)
Sodium: 127 mmol/L — ABNORMAL LOW (ref 135–145)
Sodium: 125 mmol/L — ABNORMAL LOW (ref 135–145)

## 2020-08-04 MED ORDER — SODIUM CHLORIDE 0.9 % IV SOLN: INTRAVENOUS | Status: AC

## 2020-08-04 NOTE — Plan of Care (Signed)
  Problem: Education: Goal: Knowledge of disease or condition will improve 08/04/2020 1831 by Melvenia Needles, RN Outcome: Progressing 08/04/2020 1829 by Melvenia Needles, RN Outcome: Progressing Goal: Knowledge of secondary prevention will improve 08/04/2020 1831 by Melvenia Needles, RN Outcome: Progressing 08/04/2020 1829 by Melvenia Needles, RN Outcome: Progressing Goal: Knowledge of patient specific risk factors addressed and post discharge goals established will improve 08/04/2020 1831 by Melvenia Needles, RN Outcome: Progressing 08/04/2020 1829 by Melvenia Needles, RN Outcome: Progressing Goal: Individualized Educational Video(s) 08/04/2020 1831 by Melvenia Needles, RN Outcome: Progressing 08/04/2020 1829 by Melvenia Needles, RN Outcome: Progressing   Problem: Ischemic Stroke/TIA Tissue Perfusion: Goal: Complications of ischemic stroke/TIA will be minimized Outcome: Progressing   Problem: Spontaneous Subarachnoid Hemorrhage Tissue Perfusion: Goal: Complications of Spontaneous Subarachnoid Hemorrhage will be minimized Outcome: Progressing   Problem: Ischemic Stroke/TIA Tissue Perfusion: Goal: Complications of ischemic stroke/TIA will be minimized Outcome: Progressing

## 2020-08-04 NOTE — Progress Notes (Signed)
Patient was discharged yesterday but later on social worker found out that patient will not have a bed at the facility until Monday. No acute overnight events.  Seen and examined at bedside this morning she does have any complaints.  Overall she feels great.  Vital signs remained stable.  Continue plan as previously discussed. Discussed with patient's RN at bedside  Call with further questions as needed.  Stephania Fragmin MD [facility]

## 2020-08-05 LAB — BASIC METABOLIC PANEL
Anion gap: 8 (ref 5–15)
Anion gap: 10 (ref 5–15)
Anion gap: 11 (ref 5–15)
BUN: 10 mg/dL (ref 8–23)
BUN: 7 mg/dL — ABNORMAL LOW (ref 8–23)
BUN: 6 mg/dL — ABNORMAL LOW (ref 8–23)
CO2: 21 mmol/L — ABNORMAL LOW (ref 22–32)
CO2: 20 mmol/L — ABNORMAL LOW (ref 22–32)
CO2: 20 mmol/L — ABNORMAL LOW (ref 22–32)
Calcium: 8.9 mg/dL (ref 8.9–10.3)
Calcium: 9.1 mg/dL (ref 8.9–10.3)
Calcium: 8.9 mg/dL (ref 8.9–10.3)
Chloride: 96 mmol/L — ABNORMAL LOW (ref 98–111)
Chloride: 98 mmol/L (ref 98–111)
Chloride: 97 mmol/L — ABNORMAL LOW (ref 98–111)
Creatinine, Ser: 0.62 mg/dL (ref 0.44–1.00)
Creatinine, Ser: 0.6 mg/dL (ref 0.44–1.00)
Creatinine, Ser: 0.53 mg/dL (ref 0.44–1.00)
GFR, Estimated: >60 mL/min (ref >60)
GFR, Estimated: >60 mL/min (ref >60)
GFR, Estimated: >60 mL/min (ref >60)
Glucose, Bld: 168 mg/dL — ABNORMAL HIGH (ref 70–99)
Glucose, Bld: 108 mg/dL — ABNORMAL HIGH (ref 70–99)
Glucose, Bld: 111 mg/dL — ABNORMAL HIGH (ref 70–99)
Potassium: 3.9 mmol/L (ref 3.5–5.1)
Potassium: 3.9 mmol/L (ref 3.5–5.1)
Potassium: 3.6 mmol/L (ref 3.5–5.1)
Sodium: 125 mmol/L — ABNORMAL LOW (ref 135–145)
Sodium: 128 mmol/L — ABNORMAL LOW (ref 135–145)
Sodium: 128 mmol/L — ABNORMAL LOW (ref 135–145)

## 2020-08-05 NOTE — Progress Notes (Signed)
Received call from CCMT. that pt had 2.18 sec SVR. RN went is and found pt soundly asleep with no distress awoken by RN ,pt resting well denied SOB,dizziness or any kind of pain Vs stable B 132/6 MAP 82 HR 71  RR 16   AM lab is ok  MD on call made aware. No new orders.   Will  continue to monitor pt closely.

## 2020-08-05 NOTE — Progress Notes (Signed)
No acute events overnight, patient is any complaints this morning Vital signs remained stable. Discussed with patient's RN.  Discharge summary completed on 08/03/2020 Please call with further questions as needed  Stephania Fragmin MD [facility]

## 2020-08-06 LAB — BASIC METABOLIC PANEL
Anion gap: 11 (ref 5–15)
Anion gap: 11 (ref 5–15)
Anion gap: 11 (ref 5–15)
BUN: 6 mg/dL — ABNORMAL LOW (ref 8–23)
BUN: 6 mg/dL — ABNORMAL LOW (ref 8–23)
BUN: 8 mg/dL (ref 8–23)
CO2: 20 mmol/L — ABNORMAL LOW (ref 22–32)
CO2: 21 mmol/L — ABNORMAL LOW (ref 22–32)
CO2: 22 mmol/L (ref 22–32)
Calcium: 9.3 mg/dL (ref 8.9–10.3)
Calcium: 9.6 mg/dL (ref 8.9–10.3)
Calcium: 9.3 mg/dL (ref 8.9–10.3)
Chloride: 97 mmol/L — ABNORMAL LOW (ref 98–111)
Chloride: 95 mmol/L — ABNORMAL LOW (ref 98–111)
Chloride: 95 mmol/L — ABNORMAL LOW (ref 98–111)
Creatinine, Ser: 0.48 mg/dL (ref 0.44–1.00)
Creatinine, Ser: 0.55 mg/dL (ref 0.44–1.00)
Creatinine, Ser: 0.6 mg/dL (ref 0.44–1.00)
GFR, Estimated: >60 mL/min (ref >60)
GFR, Estimated: >60 mL/min (ref >60)
GFR, Estimated: >60 mL/min (ref >60)
Glucose, Bld: 108 mg/dL — ABNORMAL HIGH (ref 70–99)
Glucose, Bld: 166 mg/dL — ABNORMAL HIGH (ref 70–99)
Glucose, Bld: 202 mg/dL — ABNORMAL HIGH (ref 70–99)
Potassium: 3.9 mmol/L (ref 3.5–5.1)
Potassium: 3.9 mmol/L (ref 3.5–5.1)
Potassium: 4 mmol/L (ref 3.5–5.1)
Sodium: 128 mmol/L — ABNORMAL LOW (ref 135–145)
Sodium: 127 mmol/L — ABNORMAL LOW (ref 135–145)
Sodium: 128 mmol/L — ABNORMAL LOW (ref 135–145)

## 2020-08-06 LAB — RESP PANEL BY RT-PCR (FLU A&B, COVID) ARPGX2
Influenza A by PCR: NEGATIVE
Influenza B by PCR: NEGATIVE
SARS Coronavirus 2 by RT PCR: NEGATIVE

## 2020-08-06 NOTE — Progress Notes (Signed)
Called [facility] to gave report to  [place]. [facility] foolowed, questions asked and answered

## 2020-08-06 NOTE — TOC Transition Note (Addendum)
Transition of Care [facility]) - CM/SW Discharge Note   Patient Details  Name: Bonnie Morton MRN: [id number] Date of Birth: 1928-02-14  Transition of Care [facility]) CM/SW Contact:  Mearl Latin, LCSW Phone Number: 08/06/2020, 5:43 PM   Clinical Narrative:    Patient will DC to: [facility] Anticipated DC date: 08/06/20 Family notified: Niece, Corporate treasurer by: Sharin Mons (8 pickups ahead of pt)   Per MD patient ready for DC to [facility]. RN to call report prior to discharge [phone]). RN, patient, patient's family, and facility notified of DC. Discharge Summary and FL2 sent to facility. DC packet on chart. Ambulance transport requested for patient.   CSW will sign off for now as social work intervention is no longer needed. Please consult [place] again if new needs arise.      Final next level of care: Skilled Nursing Facility Barriers to Discharge: Barriers Resolved   Patient Goals and CMS Choice Patient states their goals for this hospitalization and ongoing recovery are:: Pt states she is agreeable to SNF if it will help her get better. CMS Medicare.gov Compare Post Acute Care list provided to:: Patient Choice offered to / list presented to : Patient  Discharge Placement   Existing PASRR number confirmed : 08/06/20          Patient chooses bed at: [facility] Patient to be transferred to facility by: [facility] Name of family member notified: Niece Mellanie Patient and family notified of of transfer: 08/06/20  Discharge Plan and Services     Post Acute Care Choice: Skilled Nursing Facility                               Social Determinants of Health (SDOH) Interventions     Readmission Risk Interventions No flowsheet data found.

## 2020-08-06 NOTE — TOC Progression Note (Addendum)
Transition of Care [facility]) - Progression Note    Patient Details  Name: Bonnie Morton MRN: [id number] Date of Birth: [birth year]  Transition [facility] Care [facility]) CM/SW Contact  Mearl Latin, LCSW Phone Number: 08/06/2020, 1:16 PM  Clinical Narrative:    1:16pm-CSW left voicemail for [facility] admissions. Have been unable to reach them today. Will continue efforts.   2pm-Made TOC Supervisor aware that Lacinda Axon will not call back. CSW left voicemail for Corporate, [place], as well.   4:45pm-Still no call back. Patient and MD aware.   5:30pm-TOC Supervisor, Verdon Cummins, able to reach [employer] and they are requesting we send patient today; they have received dc summary. RN aware. CSW left voicemail for patient's niece; Patient aware.    Expected Discharge Plan: Skilled Nursing Facility Barriers to Discharge: Barriers Resolved  Expected Discharge Plan and Services Expected Discharge Plan: Skilled Nursing Facility     Post Acute Care Choice: Skilled Nursing Facility Living arrangements for the past 2 months: Single Family Home Expected Discharge Date: 08/04/20                                     Social Determinants of Health (SDOH) Interventions    Readmission Risk Interventions No flowsheet data found.

## 2020-08-06 NOTE — Progress Notes (Signed)
No acute events overnight.  Patient remains medically stable.  Vital signs remained stable as well.  I spoke with patient's niece Shawna Orleans this morning giving her final update regarding her disposition plans.  All the questions have been answered.  Spoke with patient's RN and TOC team.  Expecting her to have a bed at SNF today.  Discharge summary has been updated today.  Please call with further questions as needed.  Stephania Fragmin MD [facility]

## 2020-08-06 NOTE — Care Management Important Message (Signed)
Important Message  Patient Details  Name: Bonnie Morton MRN: [id number] Date of Birth: 12/10/1927   Medicare Important Message Given:  Yes     Shruti Arrey P Ashantae Pangallo 08/06/2020, 3:25 PM

## 2020-08-07 NOTE — Progress Notes (Signed)
The patient is leaving the hospital at this time and is accompanied by 3 [facility] employees. No acute distress noted or any complain of pain. Called the facility and talked to the receiving nurse that patient will be on her way in about 10 minutes.

## 2020-08-20 ENCOUNTER — Ambulatory Visit: Payer: Medicare Other | Admitting: Internal Medicine

## 2020-09-18 ENCOUNTER — Ambulatory Visit: Payer: Medicare Other | Admitting: Internal Medicine

## 2020-09-26 ENCOUNTER — Encounter: Payer: Self-pay | Admitting: General Practice
